# Patient Record
Sex: Female | Born: 1935
Health system: Southern US, Community
[De-identification: ages and names within clinical notes are randomized; demographics above are authoritative.]

## PROBLEM LIST (undated history)

## (undated) DIAGNOSIS — I351 Nonrheumatic aortic (valve) insufficiency: Secondary | ICD-10-CM

## (undated) DIAGNOSIS — I350 Nonrheumatic aortic (valve) stenosis: Secondary | ICD-10-CM

## (undated) DIAGNOSIS — I10 Essential (primary) hypertension: Secondary | ICD-10-CM

## (undated) DIAGNOSIS — I428 Other cardiomyopathies: Secondary | ICD-10-CM

## (undated) DIAGNOSIS — E785 Hyperlipidemia, unspecified: Secondary | ICD-10-CM

## (undated) DIAGNOSIS — E119 Type 2 diabetes mellitus without complications: Secondary | ICD-10-CM

## (undated) DIAGNOSIS — I2510 Atherosclerotic heart disease of native coronary artery without angina pectoris: Secondary | ICD-10-CM

## (undated) DIAGNOSIS — E039 Hypothyroidism, unspecified: Secondary | ICD-10-CM

## (undated) HISTORY — DX: Nonrheumatic aortic (valve) insufficiency: I35.1

## (undated) HISTORY — DX: Atherosclerotic heart disease of native coronary artery without angina pectoris: I25.10

## (undated) HISTORY — DX: Nonrheumatic aortic (valve) stenosis: I35.0

## (undated) HISTORY — DX: Essential (primary) hypertension: I10

## (undated) HISTORY — DX: Type 2 diabetes mellitus without complications: E11.9

## (undated) HISTORY — DX: Hyperlipidemia, unspecified: E78.5

## (undated) HISTORY — PX: SKIN GRAFT: SHX250

## (undated) HISTORY — DX: Other cardiomyopathies: I42.8

## (undated) HISTORY — DX: Hypothyroidism, unspecified: E03.9

---

## 2006-05-22 ENCOUNTER — Ambulatory Visit: Payer: Self-pay | Admitting: Cardiology

## 2006-06-06 ENCOUNTER — Ambulatory Visit: Payer: Self-pay

## 2006-06-06 ENCOUNTER — Encounter: Payer: Self-pay | Admitting: Cardiology

## 2006-06-18 ENCOUNTER — Ambulatory Visit: Payer: Self-pay | Admitting: Cardiology

## 2006-06-28 ENCOUNTER — Inpatient Hospital Stay (HOSPITAL_BASED_OUTPATIENT_CLINIC_OR_DEPARTMENT_OTHER): Admission: RE | Admit: 2006-06-28 | Discharge: 2006-06-28 | Payer: Self-pay | Admitting: Cardiology

## 2006-06-28 ENCOUNTER — Ambulatory Visit: Payer: Self-pay | Admitting: Internal Medicine

## 2006-07-05 ENCOUNTER — Ambulatory Visit: Payer: Self-pay | Admitting: Cardiology

## 2006-08-06 ENCOUNTER — Ambulatory Visit: Payer: Self-pay | Admitting: Cardiology

## 2006-10-15 ENCOUNTER — Ambulatory Visit: Payer: Self-pay | Admitting: Gastroenterology

## 2006-10-25 LAB — HM COLONOSCOPY: HM Colonoscopy: NORMAL

## 2006-10-29 ENCOUNTER — Ambulatory Visit: Payer: Self-pay | Admitting: Gastroenterology

## 2007-01-29 ENCOUNTER — Ambulatory Visit: Payer: Self-pay | Admitting: Cardiology

## 2007-02-11 ENCOUNTER — Encounter: Payer: Self-pay | Admitting: Cardiology

## 2007-02-11 ENCOUNTER — Ambulatory Visit: Payer: Self-pay

## 2007-09-19 ENCOUNTER — Ambulatory Visit: Payer: Self-pay | Admitting: Cardiology

## 2008-04-13 ENCOUNTER — Ambulatory Visit: Payer: Self-pay | Admitting: Vascular Surgery

## 2008-04-20 ENCOUNTER — Ambulatory Visit: Payer: Self-pay | Admitting: Cardiology

## 2008-04-27 ENCOUNTER — Encounter: Payer: Self-pay | Admitting: Cardiology

## 2008-04-27 ENCOUNTER — Ambulatory Visit: Payer: Self-pay | Admitting: Cardiology

## 2009-03-04 ENCOUNTER — Ambulatory Visit: Payer: Self-pay | Admitting: Cardiology

## 2009-03-15 ENCOUNTER — Ambulatory Visit: Payer: Self-pay | Admitting: Cardiology

## 2009-09-08 DIAGNOSIS — I359 Nonrheumatic aortic valve disorder, unspecified: Secondary | ICD-10-CM

## 2009-09-08 DIAGNOSIS — I10 Essential (primary) hypertension: Secondary | ICD-10-CM

## 2009-09-08 DIAGNOSIS — I429 Cardiomyopathy, unspecified: Secondary | ICD-10-CM | POA: Insufficient documentation

## 2010-03-02 LAB — HM DEXA SCAN

## 2010-10-29 ENCOUNTER — Encounter: Payer: Self-pay | Admitting: Cardiology

## 2010-10-29 ENCOUNTER — Encounter: Payer: Self-pay | Admitting: Gastroenterology

## 2010-11-02 ENCOUNTER — Encounter: Payer: Self-pay | Admitting: Gastroenterology

## 2010-11-14 ENCOUNTER — Encounter: Payer: Self-pay | Admitting: Gastroenterology

## 2010-11-15 ENCOUNTER — Ambulatory Visit: Payer: Self-pay | Admitting: Cardiology

## 2010-11-25 ENCOUNTER — Encounter: Payer: Self-pay | Admitting: Cardiology

## 2010-12-05 ENCOUNTER — Encounter (INDEPENDENT_AMBULATORY_CARE_PROVIDER_SITE_OTHER): Payer: Self-pay | Admitting: *Deleted

## 2010-12-29 ENCOUNTER — Ambulatory Visit
Admission: RE | Admit: 2010-12-29 | Discharge: 2010-12-29 | Payer: Self-pay | Source: Home / Self Care | Attending: Gastroenterology | Admitting: Gastroenterology

## 2010-12-29 ENCOUNTER — Other Ambulatory Visit: Payer: Self-pay | Admitting: Gastroenterology

## 2010-12-29 ENCOUNTER — Encounter: Payer: Self-pay | Admitting: Gastroenterology

## 2010-12-29 DIAGNOSIS — D509 Iron deficiency anemia, unspecified: Secondary | ICD-10-CM | POA: Insufficient documentation

## 2010-12-29 LAB — CBC WITH DIFFERENTIAL/PLATELET
Basophils Absolute: 0.1 10*3/uL (ref 0.0–0.1)
Basophils Relative: 1.4 % (ref 0.0–3.0)
Eosinophils Absolute: 0.2 10*3/uL (ref 0.0–0.7)
Eosinophils Relative: 2.8 % (ref 0.0–5.0)
HCT: 33 % — ABNORMAL LOW (ref 36.0–46.0)
Hemoglobin: 11.2 g/dL — ABNORMAL LOW (ref 12.0–15.0)
Lymphocytes Relative: 28.4 % (ref 12.0–46.0)
Lymphs Abs: 2.2 10*3/uL (ref 0.7–4.0)
MCHC: 33.9 g/dL (ref 30.0–36.0)
MCV: 90.8 fl (ref 78.0–100.0)
Monocytes Absolute: 0.4 10*3/uL (ref 0.1–1.0)
Monocytes Relative: 5.5 % (ref 3.0–12.0)
Neutro Abs: 4.8 10*3/uL (ref 1.4–7.7)
Neutrophils Relative %: 61.9 % (ref 43.0–77.0)
Platelets: 208 10*3/uL (ref 150.0–400.0)
RBC: 3.63 Mil/uL — ABNORMAL LOW (ref 3.87–5.11)
RDW: 18.3 % — ABNORMAL HIGH (ref 11.5–14.6)
WBC: 7.7 10*3/uL (ref 4.5–10.5)

## 2010-12-30 ENCOUNTER — Encounter: Payer: Self-pay | Admitting: Gastroenterology

## 2010-12-30 ENCOUNTER — Ambulatory Visit
Admission: RE | Admit: 2010-12-30 | Discharge: 2010-12-30 | Payer: Self-pay | Source: Home / Self Care | Attending: Gastroenterology | Admitting: Gastroenterology

## 2010-12-30 LAB — GLUCOSE, CAPILLARY
Glucose-Capillary: 186 mg/dL — ABNORMAL HIGH (ref 70–99)
Glucose-Capillary: 146 mg/dL — ABNORMAL HIGH (ref 70–99)

## 2011-01-02 ENCOUNTER — Encounter: Payer: Self-pay | Admitting: Gastroenterology

## 2011-01-11 ENCOUNTER — Ambulatory Visit
Admission: RE | Admit: 2011-01-11 | Discharge: 2011-01-11 | Payer: Self-pay | Source: Home / Self Care | Attending: Gastroenterology | Admitting: Gastroenterology

## 2011-01-11 ENCOUNTER — Encounter: Payer: Self-pay | Admitting: Gastroenterology

## 2011-01-11 DIAGNOSIS — D131 Benign neoplasm of stomach: Secondary | ICD-10-CM | POA: Insufficient documentation

## 2011-01-13 ENCOUNTER — Encounter: Payer: Self-pay | Admitting: Gastroenterology

## 2011-01-13 ENCOUNTER — Ambulatory Visit (HOSPITAL_COMMUNITY)
Admission: RE | Admit: 2011-01-13 | Discharge: 2011-01-13 | Payer: Self-pay | Source: Home / Self Care | Attending: Gastroenterology | Admitting: Gastroenterology

## 2011-01-16 LAB — GLUCOSE, CAPILLARY: Glucose-Capillary: 103 mg/dL — ABNORMAL HIGH (ref 70–99)
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
## 2011-01-24 NOTE — Letter (Signed)
Summary: New Patient letter  [facility]  [address] [place], [place] [address]   Phone: [phone]  Fax: [phone]       11/14/2010 MRN: [id number]  Emily Wall [address] [place], [place]  [address]  Dear [name],  Welcome to the Gastroenterology Division at [facility].    You are scheduled to see Dr.  Arlyce Dice on 12-29-2010 at  3:45pm on the 3rd floor at [facility], [address]  We ask that you try to arrive at our office 15 minutes prior to your appointment time to allow for check-in.  We would like you to complete the enclosed self-administered evaluation form prior to your visit and bring it with you on the day of your appointment.  We will review it with you.  Also, please bring a complete list of all your medications or, if you prefer, bring the medication bottles and we will list them.  Please bring your insurance card so that we may make a copy of it.  If your insurance requires a referral to see a specialist, please bring your referral form from your primary care physician.  Co-payments are due at the time of your visit and may be paid by cash, check or credit card.     Your office visit will consist of a consult with your physician (includes a physical exam), any laboratory testing he/she may order, scheduling of any necessary diagnostic testing (e.g. x-ray, ultrasound, CT-scan), and scheduling of a procedure (e.g. Endoscopy, Colonoscopy) if required.  Please allow enough time on your schedule to allow for any/all of these possibilities.    If you cannot keep your appointment, please call [phone] to cancel or reschedule prior to your appointment date.  This allows [place] the opportunity to schedule an appointment for another patient in need of care.  If you do not cancel or reschedule by 5 p.m. the business day prior to your appointment date, you will be charged a $50.00 late cancellation/no-show fee.    Thank you for choosing  [facility] for your medical needs.  We appreciate the opportunity to care for you.  Please visit [place] at our website  to learn more about our practice.                     Sincerely,                                                             The Gastroenterology Division

## 2011-01-26 NOTE — Letter (Signed)
Summary: Patient [facility] Biopsy Results  [facility]  [address] [place], [place] [address]   Phone: [phone]  Fax: [phone]        January 02, 2011 MRN: [id number]    Emily Wall [address] [place], [place]  [address]    Dear Ms. Sturtevant,  I am pleased to inform you that the biopsies taken during your recent endoscopic examination did not show any evidence of cancer upon pathologic examination.  Additional information/recommendations:  __No further action is needed at this time.  Please follow-up with      your primary care physician for your other healthcare needs.  _x_ Please call [phone] to schedule a return visit to review      your condition.  __ Continue with the treatment plan as outlined on the day of your      exam.  __ You should have a repeat endoscopic examination for this problem              in _ months/years.   Please call us if you are having persistent problems or have questions about your condition that have not been fully answered at this time.  Sincerely,  Louis Meckel MD  This letter has been electronically signed by your physician.  Appended Document: Patient Notice-Endo Biopsy Results Letter is mailed to the patient's home address

## 2011-01-26 NOTE — Letter (Signed)
Summary: Engineer, materials at [facility]  [address]   [place], [place] [address]   Phone: [phone]  Fax: [phone]        December 05, 2010 MRN: [id number]    DEBORHA MOSELEY [address] [place], [place]  [address]    Dear [name],  Your test ordered by Selena Batten has been reviewed by your physician (or physician assistant) and was found to be normal or stable. Your physician (or physician assistant) felt no changes were needed at this time.  __X__ Echocardiogram  ____ Cardiac Stress Test  ____ Lab Work  ____ Peripheral vascular study of arms, legs or neck  ____ CT scan or X-ray  ____ Lung or Breathing test  ____ Other:   Thank you.   Cyril Loosen, RN, BSN    Duane Boston, M.D., F.A.C.C. Thressa Sheller, M.D., F.A.C.C. Oneal Grout, M.D., F.A.C.C. Cheree Ditto, M.D., F.A.C.C. Daiva Nakayama, M.D., F.A.C.C. Kenney Houseman, M.D., F.A.C.C. Jeanne Ivan, PA-C

## 2011-01-26 NOTE — Procedures (Signed)
Summary: Upper Endoscopy  Patient: Emily Wall Note: All result statuses are Final unless otherwise noted.  Tests: (1) Upper Endoscopy (EGD)   EGD Upper Endoscopy       DONE     [facility]     [address]     [place], [place]  [address]           ENDOSCOPY PROCEDURE REPORT           PATIENT:  Emily Wall, Duty  MR#:  [id number]     BIRTHDATE:  Feb 20, 1936, 74 yrs. old  GENDER:  female           ENDOSCOPIST:  Barbette Hair. Arlyce Dice, MD     Referred by:  Rudi Heap, M.D.           PROCEDURE DATE:  12/30/2010     PROCEDURE:  EGD with biopsy, [address]     ASA CLASS:  Class II     INDICATIONS:  iron deficiency anemia, hemoccult positive stool           MEDICATIONS:   Fentanyl 25 mcg IV, Versed 2.5 mg IV,     glycopyrrolate (Robinal) 0.2 mg IV, 0.6cc simethancone 0.6 cc PO     TOPICAL ANESTHETIC:  Exactacain Spray           DESCRIPTION OF PROCEDURE:   After the risks benefits and     alternatives of the procedure were thoroughly explained, informed     consent was obtained.  The LB GIF-H180 [id number] endoscope was     introduced through the mouth and advanced to the third portion of     the duodenum, without limitations.  The instrument was slowly     withdrawn as the mucosa was fully examined.     <<PROCEDUREIMAGES>>           A sessile polyp was found in the antrum. It was 15 mm in size.     Multiple biopsies were obtained and sent to pathology (see     image1).  Otherwise the examination was normal.    Retroflexed     views revealed no abnormalities.    The scope was then withdrawn     from the patient and the procedure completed.           COMPLICATIONS:  None           ENDOSCOPIC IMPRESSION:     1) 15 mm sessile polyp in the antrum     2) Otherwise normal examination     RECOMMENDATIONS:     1) Await biopsy results           REPEAT EXAM:   You will receive a letter from Dr. Arlyce Dice in 1-2     weeks, after reviewing the final pathology, with followup  recommendations..           ______________________________     Barbette Hair Arlyce Dice, MD           CC:           n.     eSIGNED:   Barbette Hair. Kaplan at 12/30/2010 09:19 AM           Forrestine Him, [id number]  Note: An exclamation mark (!) indicates a result that was not dispersed into the flowsheet. Document Creation Date: 12/30/2010 9:19 AM _______________________________________________________________________  (1) Order result status: Final Collection or observation date-time: 12/30/2010 09:14 Requested date-time:  Receipt date-time:  Reported date-time:  Referring Physician:   Ordering Physician:  Melvia Heaps [phone]) Specimen Source:  Source: Launa Grill Order Number: [id number] Lab site:

## 2011-01-26 NOTE — Assessment & Plan Note (Signed)
Summary: ANEMIA/SCREEN FOR COLON/YF   History of Present Illness Visit Type: Initial Consult Primary GI MD: Melvia Heaps MD [facility] Primary Provider: [name], NP  Requesting Provider: Ernestina Penna, MD  Chief Complaint: Pt c/o anemia and dark stools but patient is on iron tablets  History of Present Illness:   Emily Wall is a pleasant 75 year old [place] American female referred at the request of Dr. Christell Constant and his associates for evaluation of anemia.  Fatigue prompted an ER visit in November, 2011 where a hemoglobin of 9 was noted.  MCV was 83.  She was told she was iron deficient.  Hemoglobin in 2007 was 12.9.  She was placed on iron supplements with resolution of her complaints.  She denies change in bowel habits, abdominal pain, melena or hematochezia.  She has no history of ulcer disease.  She is on no gastric irritants including nonsteroidals.  Colonoscopy in 2007 for screening was negative except for diverticulosis.  Family history is pertinent for her mother who had colon cancer in her 52s or early 95s.  The patient has diabetes, aortic stenosis and a nonischemic cardiomyopathy.   GI Review of Systems      Denies abdominal pain, acid reflux, belching, bloating, chest pain, dysphagia with liquids, dysphagia with solids, heartburn, loss of appetite, nausea, vomiting, vomiting blood, weight loss, and  weight gain.        Denies anal fissure, black tarry stools, change in bowel habit, constipation, diarrhea, diverticulosis, fecal incontinence, heme positive stool, hemorrhoids, irritable bowel syndrome, jaundice, light color stool, liver problems, rectal bleeding, and  rectal pain.    Current Medications (verified): 1)  Lisinopril 40 Mg Tabs (Lisinopril) .... Take One Tablet By Mouth Daily 2)  Coreg 25 Mg Tabs (Carvedilol) .... Take 1 Tablet By Mouth Two Times A Day. Contact Office For An Appt. 3)  Lasix 20 Mg Tabs (Furosemide) .... Take 1 Tablet By Mouth Once A Day 4)  Vitamin D 1000  Unit Tabs (Cholecalciferol) .... Take 1 Tablet By Mouth Twice A Day 5)  Caltrate 600+d Plus 600-400 Mg-Unit Tabs (Calcium Carbonate-Vit D-Min) .... Take 1 Tablet By Mouth Two Times A Day 6)  Fosamax 70 Mg Tabs (Alendronate Sodium) .... Take One By Mouth Weekly 7)  Metformin Hcl 500 Mg Tabs (Metformin Hcl) .... Take 1 Tablet By Mouth Two Times A Day 8)  Januvia 100 Mg Tabs (Sitagliptin Phosphate) .... Take 1 Tablet By Mouth Once A Day 9)  Aspirin 325 Mg Tabs (Aspirin) .... Take 1 Tablet By Mouth Once A Day 10)  Ferrous Sulfate 325 (65 Fe) Mg Tabs (Ferrous Sulfate) .... One Tablet By Mouth Two Times A Day  Allergies (verified): No Known Drug Allergies  Past History:  Past Medical History: Nonischemic cardiomyopathy - LVEF improved to 50-55% Diabetes Type 2 Aortic Stenosis - mild to moderate Aortic Insufficiency - mild CAD - mild, nonobstructive at catheterization 2007 Hypertension Diverticulosis Thyroid Disease  Anemia Arthritis  Past Surgical History: Reviewed history from 11/15/2010 and no changes required. Unremarkable  Family History: Family History of Colon Cancer: Mother   Social History: Retired Widowed 2 Childern Tobacco Use - Former.  Alcohol Use - no Illicit Drug Use - no Drug Use:  no  Review of Systems       The patient complains of anemia, back pain, cough, hearing problems, and itching.  The patient denies allergy/sinus, anxiety-new, arthritis/joint pain, blood in urine, breast changes/lumps, change in vision, confusion, coughing up blood, depression-new, fainting, fatigue, fever, headaches-new,  heart murmur, heart rhythm changes, menstrual pain, muscle pains/cramps, night sweats, nosebleeds, pregnancy symptoms, shortness of breath, skin rash, sleeping problems, sore throat, swelling of feet/legs, swollen lymph glands, thirst - excessive , urination - excessive , urination changes/pain, urine leakage, vision changes, and voice change.         All other systems  were reviewed and were negative   Vital Signs:  Patient profile:   75 year old female Height:      66 inches Weight:      181 pounds BMI:     29.32 BSA:     1.92 Pulse rate:   74 / minute Pulse rhythm:   regular BP sitting:   136 / 88  (left arm) Cuff size:   regular  Vitals Entered By: Ok Anis CMA (December 29, 2010 3:36 PM)  Physical Exam  Additional Exam:  On physical exam she is a well-developed well-nourished female  skin: anicter  HEENT: normocephalic; PEERLA; no nasal or orpharyngeal abnormalities neck: supple nodes: no cervical adenopathy chest: clear cor:  no  gallops or rubs; is a 2/6 early systolic murmur heard throughout the precordium with radiation to the second right intercostal space abd:  bowel sounds normoactive; no abdominal masses, tenderness, organomegaly rectal: no masses; stool trace heme-positive ext: no cyanosis, clubbing, or edema skeletal: no gross skeletal abnormalities neuro: alert, oriented x 3; no focal abnormalities    Impression & Recommendations:  Problem # 1:  IRON DEFICIENCY (ICD-280.9)  The patient has borderline microcytic indices with a clinical response to supplementary iron all consistent with iron deficiency anemia.  She has occult GI bleeding as evidenced by her Hemoccult cards.  Chronic GI bleeding sources should be ruled out including neoplasm, AVMs (h/o aortic stenosis), polyps and ulcers.  Recommendations #1 upper endoscopy #2 colonoscopy if endoscopy is not diagnostic for a GI bleeding source #3 CBC  Orders: TLB-CBC Platelet - w/Differential (85025-CBCD) EGD (EGD)  Problem # 2:  FM HX MALIGNANT NEOPLASM GASTROINTESTINAL TRACT (ICD-V16.0) Colonoscopy every 5 years  Problem # 3:  AORTIC STENOSIS (ICD-424.1) Assessment: Comment Only  Problem # 4:  CARDIOMYOPATHY, SECONDARY (ICD-425.9) Assessment: Comment Only  Patient Instructions: 1)  Copy sent to : [name], NP  Ernestina Penna, MD  2)  Your EGD is  scheduled for 12/30/2010 3)  The medication list was reviewed and reconciled.  All changed / newly prescribed medications were explained.  A complete medication list was provided to the patient / caregiver.

## 2011-01-26 NOTE — Assessment & Plan Note (Signed)
Summary: EGD f/u/[facility]   History of Present Illness Visit Type: Follow-up Visit Primary GI MD: Melvia Heaps MD [facility] Primary Provider: [name], NP  Requesting Provider: Ernestina Penna, MD  Chief Complaint: follow-up EGD History of Present Illness:   [name] has returned for followup of her iron deficiency anemia.  Endoscopy demonstrated a slightly friable 15 mm gastric polyp.  Biopsies showed hyperplastic changes only.  She has no GI complaints.   GI Review of Systems      Denies abdominal pain, acid reflux, belching, bloating, chest pain, dysphagia with liquids, dysphagia with solids, heartburn, loss of appetite, nausea, vomiting, vomiting blood, weight loss, and  weight gain.        Denies anal fissure, black tarry stools, change in bowel habit, constipation, diarrhea, diverticulosis, fecal incontinence, heme positive stool, hemorrhoids, irritable bowel syndrome, jaundice, light color stool, liver problems, rectal bleeding, and  rectal pain.    Current Medications (verified): 1)  Lisinopril 40 Mg Tabs (Lisinopril) .... Take One Tablet By Mouth Daily 2)  Coreg 25 Mg Tabs (Carvedilol) .... Take 1 Tablet By Mouth Two Times A Day. Contact Office For An Appt. 3)  Lasix 20 Mg Tabs (Furosemide) .... Take 1 Tablet By Mouth Once A Day 4)  Vitamin D 1000 Unit Tabs (Cholecalciferol) .... Take 1 Tablet By Mouth Twice A Day 5)  Caltrate 600+d Plus 600-400 Mg-Unit Tabs (Calcium Carbonate-Vit D-Min) .... Take 1 Tablet By Mouth Two Times A Day 6)  Fosamax 70 Mg Tabs (Alendronate Sodium) .... Take One By Mouth Weekly 7)  Metformin Hcl 500 Mg Tabs (Metformin Hcl) .... Take 1 Tablet By Mouth Two Times A Day 8)  Januvia 100 Mg Tabs (Sitagliptin Phosphate) .... Take 1 Tablet By Mouth Once A Day 9)  Aspirin 325 Mg Tabs (Aspirin) .... Take 1 Tablet By Mouth Once A Day 10)  Ferrous Sulfate 325 (65 Fe) Mg Tabs (Ferrous Sulfate) .... One Tablet By Mouth Two Times A Day  Allergies (verified): No Known  Drug Allergies  Past History:  Past Medical History: Reviewed history from 12/29/2010 and no changes required. Nonischemic cardiomyopathy - LVEF improved to 50-55% Diabetes Type 2 Aortic Stenosis - mild to moderate Aortic Insufficiency - mild CAD - mild, nonobstructive at catheterization 2007 Hypertension Diverticulosis Thyroid Disease  Anemia Arthritis  Past Surgical History: Reviewed history from 11/15/2010 and no changes required. Unremarkable  Family History: Reviewed history from 12/29/2010 and no changes required. Family History of Colon Cancer: Mother   Social History: Reviewed history from 12/29/2010 and no changes required. Retired Widowed 2 Childern Tobacco Use - Former.  Alcohol Use - no Illicit Drug Use - no  Review of Systems       The patient complains of allergy/sinus and cough.  The patient denies anemia, anxiety-new, arthritis/joint pain, back pain, blood in urine, breast changes/lumps, change in vision, confusion, coughing up blood, depression-new, fainting, fatigue, fever, headaches-new, hearing problems, heart murmur, heart rhythm changes, itching, menstrual pain, muscle pains/cramps, night sweats, nosebleeds, pregnancy symptoms, shortness of breath, skin rash, sleeping problems, sore throat, swelling of feet/legs, swollen lymph glands, thirst - excessive , urination - excessive , urination changes/pain, urine leakage, vision changes, and voice change.    Vital Signs:  Patient profile:   75 year old female Height:      66 inches Weight:      180 pounds BMI:     29.16 Pulse rate:   68 / minute Pulse rhythm:   regular BP sitting:  150 / 76  (left arm)  Vitals Entered By: Milford Cage NCMA (January 11, 2011 10:12 AM)   Impression & Recommendations:  Problem # 1:  IRON DEFICIENCY (ICD-280.9)  Her gastric polyp could be the source for chronic GI blood loss.  At the same time, I think we need to proceed with colonoscopy to rule out a lower GI  source as well.  Recommendations #1 EGD with band ligation of her gastric polyp #2 colonoscopy #3 followup Hemoccults after her polyp has been treated and if her colonoscopy is negative  Orders: Colonoscopy (Colon) ZEGD with Banding (ZEGD Band)  Problem # 2:  BENIGN NEOPLASM OF STOMACH (ICD-211.1) Since this could be a source of chronic GI blood loss he will be removed via band ligation.  Problem # 3:  CARDIOMYOPATHY, SECONDARY (ICD-425.9) Assessment: Comment Only  Patient Instructions: 1)  Copy sent to : [name], NP   Ernestina Penna, MD 2)   Your EGD is scheduled on 01/13/2011 at 12:30pm 3)  Conscious Sedation brochure given.  4)  Upper Endoscopy brochure given.  5)  Your Colonoscopy is scheduled on 02/02/2011 at 1:30pm 6)  We will send in your Moviprep to your pahrmacy today 7)  Colonoscopy and Flexible Sigmoidoscopy brochure given.  8)  The medication list was reviewed and reconciled.  All changed / newly prescribed medications were explained.  A complete medication list was provided to the patient / caregiver.

## 2011-01-26 NOTE — Procedures (Signed)
Summary: Preparation for Endoscopy / [facility]  Preparation for Endoscopy / [facility]   Imported By: Lennie Odor 01/16/2011 12:27:58  _____________________________________________________________________  External Attachment:    Type:   Image     Comment:   External Document

## 2011-01-26 NOTE — Letter (Signed)
Summary: EGD Instructions  [facility]  [address] [place], [place] [address]   Phone: [phone]  Fax: [phone]       Emily Wall    May 29, 1936    MRN: [id number]       Procedure Day /Date:FRIDAY 12/30/2010     Arrival Time: 8AM     Procedure Time:9AM     Location of Procedure:                    X  [facility] (4th Floor)   PREPARATION FOR ENDOSCOPY   On 12/30/2010  THE DAY OF THE PROCEDURE:  1.   No solid foods, milk or milk products are allowed after midnight the night before your procedure.  2.   Do not drink anything colored red or purple.  Avoid juices with pulp.  No orange juice.  3.  You may drink clear liquids until 7AM  which is 2 hours before your procedure.                                                                                                CLEAR LIQUIDS INCLUDE: Water Jello Ice Popsicles Tea (sugar ok, no milk/cream) Powdered fruit flavored drinks Coffee (sugar ok, no milk/cream) Gatorade Juice: apple, white grape, white cranberry  Lemonade Clear bullion, consomm, broth Carbonated beverages (any kind) Strained chicken noodle soup Hard Candy   MEDICATION INSTRUCTIONS  Unless otherwise instructed, you should take regular prescription medications with a small sip of water as early as possible the morning of your procedure.  Diabetic patients - see separate instructions.         OTHER INSTRUCTIONS  You will need a responsible adult at least 72 years of age to accompany you and drive you home.   This person must remain in the waiting room during your procedure.  Wear loose fitting clothing that is easily removed.  Leave jewelry and other valuables at home.  However, you may wish to bring a book to read or an iPod/MP3 player to listen to music as you wait for your procedure to start.  Remove all body piercing jewelry and leave at home.  Total time from sign-in until discharge is approximately 2-3  hours.  You should go home directly after your procedure and rest.  You can resume normal activities the day after your procedure.  The day of your procedure you should not:   Drive   Make legal decisions   Operate machinery   Drink alcohol   Return to work  You will receive specific instructions about eating, activities and medications before you leave.    The above instructions have been reviewed and explained to me by   _______________________    I fully understand and can verbalize these instructions _____________________________ Date _________

## 2011-01-26 NOTE — Letter (Signed)
Summary: [facility] Instructions  [facility]  [address] [place], [place] [address]   Phone: [phone]  Fax: [phone]       Emily Wall    1936-01-06    MRN: [id number]        Procedure Day /Date:THURSDAY 02/02/2011     Arrival Time:12:30PM     Procedure Time:1:30PM     Location of Procedure:                    X   [facility] (4th Floor)   PREPARATION FOR COLONOSCOPY WITH MOVIPREP   Starting 5 days prior to your procedure 01/28/2011  do not eat nuts, seeds, popcorn, corn, beans, peas,  salads, or any raw vegetables.  Do not take any fiber supplements (e.g. Metamucil, Citrucel, and Benefiber).  THE DAY BEFORE YOUR PROCEDURE         DATE: 02/01/2011  DAY:WED  1.  Drink clear liquids the entire day-NO SOLID FOOD  2.  Do not drink anything colored red or purple.  Avoid juices with pulp.  No orange juice.  3.  Drink at least 64 oz. (8 glasses) of fluid/clear liquids during the day to prevent dehydration and help the prep work efficiently.  CLEAR LIQUIDS INCLUDE: Water Jello Ice Popsicles Tea (sugar ok, no milk/cream) Powdered fruit flavored drinks Coffee (sugar ok, no milk/cream) Gatorade Juice: apple, white grape, white cranberry  Lemonade Clear bullion, consomm, broth Carbonated beverages (any kind) Strained chicken noodle soup Hard Candy                             4.  In the morning, mix first dose of MoviPrep solution:    Empty 1 Pouch A and 1 Pouch B into the disposable container    Add lukewarm drinking water to the top line of the container. Mix to dissolve    Refrigerate (mixed solution should be used within 24 hrs)  5.  Begin drinking the prep at 5:00 p.m. The MoviPrep container is divided by 4 marks.   Every 15 minutes drink the solution down to the next mark (approximately 8 oz) until the full liter is complete.   6.  Follow completed prep with 16 oz of clear liquid of your choice (Nothing red or purple).  Continue  to drink clear liquids until bedtime.  7.  Before going to bed, mix second dose of MoviPrep solution:    Empty 1 Pouch A and 1 Pouch B into the disposable container    Add lukewarm drinking water to the top line of the container. Mix to dissolve    Refrigerate  THE DAY OF YOUR PROCEDURE      DATE: 02/02/2011 DAY: THURSDAY  Beginning at 8:30a.m. (5 hours before procedure):         1. Every 15 minutes, drink the solution down to the next mark (approx 8 oz) until the full liter is complete.  2. Follow completed prep with 16 oz. of clear liquid of your choice.    3. You may drink clear liquids until 11:30AM  (2 HOURS BEFORE PROCEDURE).   MEDICATION INSTRUCTIONS  Unless otherwise instructed, you should take regular prescription medications with a small sip of water   as early as possible the morning of your procedure.  Diabetic patients - see separate instructions. HOLD IRON 7 DAYS BEFORE PROCEDURE       OTHER INSTRUCTIONS  You will need  a responsible adult at least 73 years of age to accompany you and drive you home.   This person must remain in the waiting room during your procedure.  Wear loose fitting clothing that is easily removed.  Leave jewelry and other valuables at home.  However, you may wish to bring a book to read or  an iPod/MP3 player to listen to music as you wait for your procedure to start.  Remove all body piercing jewelry and leave at home.  Total time from sign-in until discharge is approximately 2-3 hours.  You should go home directly after your procedure and rest.  You can resume normal activities the  day after your procedure.  The day of your procedure you should not:   Drive   Make legal decisions   Operate machinery   Drink alcohol   Return to work  You will receive specific instructions about eating, activities and medications before you leave.    The above instructions have been reviewed and explained to me by    _______________________    I fully understand and can verbalize these instructions _____________________________ Date _________

## 2011-01-26 NOTE — Letter (Signed)
Summary: Diabetic Instructions  [facility]  [address] [place], [place] [address]   Phone: [phone]  Fax: [phone]    Emily Wall 1936-05-26 MRN: [id number]     X    ORAL DIABETIC MEDICATION INSTRUCTIONS  The day before your procedure:   Take your diabetic pill as you do normally  The day of your procedure:   Do not take your diabetic pill    We will check your blood sugar levels during the admission process and again in Recovery before discharging you home  ________________________________________________________________________  _  _   INSULIN (LONG ACTING) MEDICATION INSTRUCTIONS (Lantus, NPH, 70/30, Humulin, Novolin-N)   The day before your procedure:   Take  your regular evening dose    The day of your procedure:   Do not take your morning dose    _  _   INSULIN (SHORT ACTING) MEDICATION INSTRUCTIONS (Regular, Humulog, Novolog)   The day before your procedure:   Do not take your evening dose   The day of your procedure:   Do not take your morning dose   _  _   INSULIN PUMP MEDICATION INSTRUCTIONS  We will contact the physician managing your diabetic care for written dosage instructions for the day before your procedure and the day of your procedure.  Once we have received the instructions, we will contact you.

## 2011-01-26 NOTE — Letter (Signed)
Summary: [facility]  [facility]   Imported By: Sherian Rein 01/06/2011 07:48:03  _____________________________________________________________________  External Attachment:    Type:   Image     Comment:   External Document

## 2011-01-26 NOTE — Letter (Signed)
Summary: Appt Reminder 2  [facility]  [address] [place], [place] [address]   Phone: [phone]  Fax: [phone]        January 02, 2011 MRN: [id number]    Emily Wall [address] [place], [place]  [address]    Dear [name],   You have a return appointment with Dr. Arlyce Dice on 01/11/11 at 10:15am.  Please remember to bring a complete list of the medicines you are taking, your insurance card and your co-pay.  If you have to cancel or reschedule this appointment, please call before 5:00 pm the evening before to avoid a cancellation fee.  If you have any questions or concerns, please call [phone].    Sincerely,    Selinda Michaels RN

## 2011-01-26 NOTE — Procedures (Signed)
Summary: colonoscopy   Colonoscopy  Procedure date:  10/29/2006  Findings:      Results: Diverticulosis.       Location:  [facility].    Comments:      Repeat colonoscopy in 10 years.    Colonoscopy  Procedure date:  10/29/2006  Findings:      Results: Diverticulosis.       Location:  [facility].    Comments:      Repeat colonoscopy in 10 years.   Patient Name: Emily Wall, Emily Wall MRN:  Procedure Procedures: Colonoscopy CPT: [address].  Personnel: Endoscopist: Barbette Hair. Arlyce Dice, MD.  Patient Consent: Procedure, Alternatives, Risks and Benefits discussed, consent obtained, from patient.  Indications  Average Risk Screening Routine.  History  Current Medications: Patient is not currently taking Coumadin.  Pre-Exam Physical: Performed Oct 29, 2006. Cardio-pulmonary exam, HEENT exam , Abdominal exam, Mental status exam WNL.  Comments: Patient history reviewed/updated, physical performed prior to initiation of sedation?yes Exam Exam: Extent of exam reached: Cecum, extent intended: Cecum.  The cecum was identified by appendiceal orifice and IC valve. Time to Cecum: 00:04: 44. Time for Withdrawl: 00:06:59. Colon retroflexion performed. ASA Classification: I. Tolerance: good.  Monitoring: Pulse and BP monitoring, Oximetry used. Supplemental O2 given. at 2 Liters.  Colon Prep Used Miralax for colon prep. Prep results: fair, adequate exam.  Sedation Meds: Patient assessed and found to be appropriate for moderate (conscious) sedation. Sedation was managed by the Endoscopist. Fentanyl 50 mcg. given IV. Versed 7 mg. given IV.  Findings - DIVERTICULOSIS: Ascending Colon to Descending Colon. ICD9: Diverticulosis: 562.10. Comments: Diffuse diverticulosis.  NORMAL EXAM: Cecum.  - DIVERTICULOSIS: Sigmoid Colon. Comments: Diffuse diverticula.  - NORMAL EXAM: Sigmoid Colon to Rectum.   Assessment Abnormal examination, see findings above.    Diagnoses: 562.10: Diverticulosis.   Events  Unplanned Interventions: No intervention was required.  Unplanned Events: There were no complications. Plans  Post Exam Instructions: Post sedation instructions given.  Patient Education: Patient given standard instructions for: Diverticulosis.  Disposition: After procedure patient sent to recovery. After recovery patient sent home.  Scheduling/Referral: Colonoscopy, to Barbette Hair. Arlyce Dice, MD, around Oct 29, 2016.    This report was created from the original endoscopy report, which was reviewed and signed by the above listed endoscopist.

## 2011-01-26 NOTE — Letter (Signed)
Summary: EGD Instructions  [facility]  [address] [place], [place] [address]   Phone: [phone]  Fax: [phone]       Emily Wall    19-Feb-1936    MRN: [id number]       Procedure Day /Date:FRIDAY 01/13/2011     Arrival Time: 11:30AM      Procedure Time:12:30PM     Location of Procedure:                     X _ [facility] ( Outpatient Registration)  PREPARATION FOR ENDOSCOPY/BANDING   On 01/13/2011  THE DAY OF THE PROCEDURE:  1.   No solid foods, milk or milk products are allowed after midnight the night before your procedure.  2.   Do not drink anything colored red or purple.  Avoid juices with pulp.  No orange juice.  3.  You may drink clear liquids until 10:30AM , which is 2 hours before your procedure.                                                                                                CLEAR LIQUIDS INCLUDE: Water Jello Ice Popsicles Tea (sugar ok, no milk/cream) Powdered fruit flavored drinks Coffee (sugar ok, no milk/cream) Gatorade Juice: apple, white grape, white cranberry  Lemonade Clear bullion, consomm, broth Carbonated beverages (any kind) Strained chicken noodle soup Hard Candy   MEDICATION INSTRUCTIONS  Unless otherwise instructed, you should take regular prescription medications with a small sip of water as early as possible the morning of your procedure.  Diabetic patients - see separate instructions.              OTHER INSTRUCTIONS  You will need a responsible adult at least 73 years of age to accompany you and drive you home.   This person must remain in the waiting room during your procedure.  Wear loose fitting clothing that is easily removed.  Leave jewelry and other valuables at home.  However, you may wish to bring a book to read or an iPod/MP3 player to listen to music as you wait for your procedure to start.  Remove all body piercing jewelry and leave at home.  Total time from  sign-in until discharge is approximately 2-3 hours.  You should go home directly after your procedure and rest.  You can resume normal activities the day after your procedure.  The day of your procedure you should not:   Drive   Make legal decisions   Operate machinery   Drink alcohol   Return to work  You will receive specific instructions about eating, activities and medications before you leave.    The above instructions have been reviewed and explained to me by   _______________________    I fully understand and can verbalize these instructions _____________________________ Date _________

## 2011-01-26 NOTE — Letter (Signed)
Summary: Diabetic Instructions  [facility]  [address] [place], [place] [address]   Phone: [phone]  Fax: [phone]    ADELE MILSON 1935-12-31 MRN: [id number]   X    ORAL DIABETIC MEDICATION INSTRUCTIONS  The day before your procedure:   Take your diabetic pill as you do normally  The day of your procedure:   Do not take your diabetic pill    We will check your blood sugar levels during the admission process and again in Recovery before discharging you home  ________________________________________________________________________  _  _   INSULIN (LONG ACTING) MEDICATION INSTRUCTIONS (Lantus, NPH, 70/30, Humulin, Novolin-N)   The day before your procedure:   Take  your regular evening dose    The day of your procedure:   Do not take your morning dose    _  _   INSULIN (SHORT ACTING) MEDICATION INSTRUCTIONS (Regular, Humulog, Novolog)   The day before your procedure:   Do not take your evening dose   The day of your procedure:   Do not take your morning dose   _  _   INSULIN PUMP MEDICATION INSTRUCTIONS  We will contact the physician managing your diabetic care for written dosage instructions for the day before your procedure and the day of your procedure.  Once we have received the instructions, we will contact you.

## 2011-01-26 NOTE — Procedures (Signed)
Summary: Upper Endoscopy  Patient: Emily Wall Note: All result statuses are Final unless otherwise noted.  Tests: (1) Upper Endoscopy (EGD)   EGD Upper Endoscopy       DONE     [facility]     [address] [place], [place]  [address]           ENDOSCOPY PROCEDURE REPORT           PATIENT:  Emily Wall, Penafiel  MR#:  [id number]     BIRTHDATE:  1936/08/24, 74 yrs. old  GENDER:  female           ENDOSCOPIST:  Barbette Hair. Arlyce Dice, MD     Referred by:  Rosalyn Gess. Norins, M.D.           PROCEDURE DATE:  01/13/2011     PROCEDURE:  EGD with band ligation  (removal) of Gastric Polyp     ASA CLASS:  Class II     INDICATIONS:  iron deficiency anemia, other           MEDICATIONS:   Fentanyl 60 mcg IV, Versed 5 mg IV, glycopyrrolate     (Robinal) 0.2 mg IV     TOPICAL ANESTHETIC:  Cetacaine Spray           DESCRIPTION OF PROCEDURE:   After the risks benefits and     alternatives of the procedure were thoroughly explained, informed     consent was obtained.  The  endoscope was introduced through the     mouth and advanced to the bulb of duodenum, without limitations.     The instrument was slowly withdrawn as the mucosa was fully     examined.     <<PROCEDUREIMAGES>>           A sessile polyp was found in the antrum (see image1 and image2).     15mm sessile polyp - 2 bands were placed at base of polyp     Otherwise the examination was normal.    Retroflexed views revealed     no abnormalities.    The scope was then withdrawn from the patient     and the procedure completed.           COMPLICATIONS:  None           ENDOSCOPIC IMPRESSION:     1) Sessile polyp in the antrum - s/p band ligation     2) Otherwise normal examination     RECOMMENDATIONS:     1) FOLLOW UP EGD in 3 months           REPEAT EXAM:   3 month(s) EGD.           ______________________________     Barbette Hair. Arlyce Dice, MD           CC:           n.     eSIGNED:   Barbette Hair. Conchita Truxillo at 01/13/2011  12:52 PM           Forrestine Him, [id number]  Note: An exclamation mark (!) indicates a result that was not dispersed into the flowsheet. Document Creation Date: 01/13/2011 12:52 PM _______________________________________________________________________  (1) Order result status: Final Collection or observation date-time: 01/13/2011 12:47 Requested date-time:  Receipt date-time:  Reported date-time:  Referring Physician:   Ordering Physician: Melvia Heaps [phone]) Specimen Source:  Source: Launa Grill Order Number: [id number] Lab site:

## 2011-01-31 ENCOUNTER — Telehealth: Payer: Self-pay | Admitting: Gastroenterology

## 2011-02-02 ENCOUNTER — Other Ambulatory Visit (AMBULATORY_SURGERY_CENTER): Payer: MEDICARE | Admitting: Gastroenterology

## 2011-02-02 ENCOUNTER — Other Ambulatory Visit: Payer: Self-pay | Admitting: Gastroenterology

## 2011-02-02 DIAGNOSIS — D509 Iron deficiency anemia, unspecified: Secondary | ICD-10-CM

## 2011-02-02 DIAGNOSIS — K5730 Diverticulosis of large intestine without perforation or abscess without bleeding: Secondary | ICD-10-CM

## 2011-02-03 ENCOUNTER — Encounter: Payer: Self-pay | Admitting: Gastroenterology

## 2011-02-09 NOTE — Letter (Signed)
Summary: CMA Hemoccult Letter  [facility]  [address] [place], [place] [address]   Phone: [phone]  Fax: [phone]         February 03, 2011 MRN: [id number]    Emily Wall [address] [place], [place]  [address]    Dear [name],     At your Endoscopy visit, Dr. Arlyce Dice requested that you complete the hemoccult cards given to you at your last visit. Please follow the instructions on the inside cover and return them as soon as possible.If you have misplaced the hemoccult cards, please call me at [phone] and I will mail you new cards. Your health is very important to [place].These tests will help ensure that Dr. Arlyce Dice has all the information at his disposal to make a complete diagnosis for you.  Thank you for your prompt attention to this matter.   Sincerely,    Selinda Michaels RN

## 2011-02-09 NOTE — Procedures (Signed)
Summary: Colon  COLONOSCOPY PROCEDURE REPORT  PATIENT:  Emily Wall, Emily Wall  MR#:  [id number] BIRTHDATE:   12/25/1936, 74 yrs. old   GENDER:   female  ENDOSCOPIST:   Barbette Hair. Arlyce Dice, MD Referred by: Rudi Heap, M.D.  PROCEDURE DATE:  02/02/2011 PROCEDURE:  Diagnostic Colonoscopy ASA CLASS:   Class II INDICATIONS: 1) Iron deficiency anemia   MEDICATIONS:    Fentanyl 50 mcg IV, Versed 6 mg IV  DESCRIPTION OF PROCEDURE:   After the risks benefits and alternatives of the procedure were thoroughly explained, informed consent was obtained.  Digital rectal exam was performed and revealed no abnormalities.   The LB 180AL [id number] endoscope was introduced through the anus and advanced to the cecum, which was identified by the ileocecal valve, without limitations.  The quality of the prep was good, using MoviPrep.  The instrument was then slowly withdrawn as the colon was fully examined. <<PROCEDUREIMAGES>>            <<OLD IMAGES>>  FINDINGS:  Moderate diverticulosis was found (see image2 and image4). Ascending colon and cecum  This was otherwise a normal examination of the colon (see image5, image6, image7, image8, and image9).   Retroflexed views in the rectum revealed no abnormalities.    The time to cecum =  5.75  minutes. The scope was then withdrawn (time =  6.0  min) from the patient and the procedure completed.  COMPLICATIONS:   None  ENDOSCOPIC IMPRESSION:  1) Moderate diverticulosis  2) Otherwise normal examination RECOMMENDATIONS:  1) followup hemeoccults 4-5 days  REPEAT EXAM:   No   _______________________________ Barbette Hair. Arlyce Dice, MD    CC:

## 2011-02-09 NOTE — Progress Notes (Signed)
Summary: Medication  Phone Note Call from Patient Call back at Home Phone [phone]   Caller: Patient Call For: Dr. Arlyce Dice Reason for Call: Talk to Nurse Summary of Call:  NEEDS PREP CALLED IN TO [facility] IN [place] Initial call taken by: [place] Johnson,  January 31, 2011 12:07 PM  Follow-up for Phone Call        Called pt to inform that MoviPrep was sent, No Answer Follow-up by: Merri Ray CMA (AAMA),  January 31, 2011 1:20 PM    New/Updated Medications: MOVIPREP 100 GM  SOLR (PEG-KCL-NACL-NASULF-NA ASC-C) As per prep instructions. Prescriptions: MOVIPREP 100 GM  SOLR (PEG-KCL-NACL-NASULF-NA ASC-C) As per prep instructions.  #1 x 0   Entered by:   Merri Ray CMA (AAMA)   Authorized by:   Louis Meckel MD   Signed by:   Merri Ray CMA (AAMA) on 01/31/2011   Method used:   Electronically to        [facility] [phone]* (retail)       [address] [place], [place]  [address]       Ph: [phone] or [phone]       Fax: [phone]   RxID:   [id number]

## 2011-02-16 ENCOUNTER — Other Ambulatory Visit: Payer: Self-pay | Admitting: Gastroenterology

## 2011-02-16 ENCOUNTER — Other Ambulatory Visit: Payer: MEDICARE

## 2011-02-16 ENCOUNTER — Encounter: Payer: Self-pay | Admitting: Gastroenterology

## 2011-02-16 ENCOUNTER — Encounter (INDEPENDENT_AMBULATORY_CARE_PROVIDER_SITE_OTHER): Payer: Self-pay | Admitting: *Deleted

## 2011-02-16 DIAGNOSIS — Z1289 Encounter for screening for malignant neoplasm of other sites: Secondary | ICD-10-CM

## 2011-02-16 LAB — HEMOCCULT SLIDES (X 3 CARDS)
OCCULT 1: NEGATIVE
OCCULT 2: NEGATIVE
OCCULT 3: NEGATIVE
OCCULT 4: NEGATIVE
OCCULT 5: NEGATIVE

## 2011-02-21 NOTE — Letter (Addendum)
Summary: Results Letter  [facility]  [address] [place], [place] [address]   Phone: [phone]  Fax: [phone]        February 16, 2011 MRN: [id number]    Emily Wall [address] [place], [place]  [address]    Dear Ms. Brightwell,  Your hemeoccult cards testing for microscopic bleeding were negative.  No further GI workup is required at this time.   Please continue with the recommendations that we previously discussed. Should you have any further questions or concerns, feel free to contact me.    Sincerely,  Barbette Hair. Arlyce Dice, M.D., [facility]          Sincerely,  Louis Meckel MD  This letter has been electronically signed by your physician.  Appended Document: Results Letter letter mailed

## 2011-02-23 ENCOUNTER — Telehealth (INDEPENDENT_AMBULATORY_CARE_PROVIDER_SITE_OTHER): Payer: Self-pay

## 2011-02-23 ENCOUNTER — Encounter: Payer: Self-pay | Admitting: Gastroenterology

## 2011-03-02 NOTE — Letter (Signed)
Summary: Diabetic Instructions  [facility]  [address]   [place], [place] [address]   Phone: [phone]  Fax: [phone]    Michaelina Hershman 05/21/1936 MRN: [id number]   X    ORAL DIABETIC MEDICATION INSTRUCTIONS  The day before your procedure:   Take your diabetic pill as you do normally  The day of your procedure:   Do not take your diabetic pill    We will check your blood sugar levels during the admission process and again in Recovery before discharging you home  ________________________________________________________________________  _  _   INSULIN (LONG ACTING) MEDICATION INSTRUCTIONS (Lantus, NPH, 70/30, Humulin, Novolin-N)   The day before your procedure:   Take  your regular evening dose    The day of your procedure:   Do not take your morning dose    _  _   INSULIN (SHORT ACTING) MEDICATION INSTRUCTIONS (Regular, Humulog, Novolog)   The day before your procedure:   Do not take your evening dose   The day of your procedure:   Do not take your morning dose   _  _   INSULIN PUMP MEDICATION INSTRUCTIONS  We will contact the physician managing your diabetic care for written dosage instructions for the day before your procedure and the day of your procedure.  Once we have received the instructions, we will contact you. 

## 2011-03-02 NOTE — Progress Notes (Signed)
Summary: Schedule endo  Phone Note Outgoing Call Call back at [facility] Phone [phone]   Call placed by: Selinda Michaels RN,  February 23, 2011 11:17 AM Call placed to: Patient Summary of Call: Patient scheduled for a repeat EGD for April 20th at 8am. Patient to arrive at the [facility] at 7:30am. Instuctions mailed to patient. Patient verbalized understanding. Initial call taken by: Selinda Michaels RN,  February 23, 2011 11:18 AM

## 2011-03-02 NOTE — Letter (Signed)
Summary: EGD Instructions  [facility]  [address] [place], [place] [address]   Phone: [phone]  Fax: [phone]       Emily Wall    October 01, 1936    MRN: [id number]       Procedure Day /Date:04/14/11     Arrival Time: 7:30am     Procedure Time:8:00am     Location of Procedure:                    _ X _ [facility] (4th Floor)  PREPARATION FOR ENDOSCOPY   On 04/14/11 THE DAY OF THE PROCEDURE:  1.   No solid foods, milk or milk products are allowed after midnight the night before your procedure.  2.   Do not drink anything colored red or purple.  Avoid juices with pulp.  No orange juice.  3.  You may drink clear liquids until 6:00am, which is 2 hours before your procedure.                                                                                                CLEAR LIQUIDS INCLUDE: Water Jello Ice Popsicles Tea (sugar ok, no milk/cream) Powdered fruit flavored drinks Coffee (sugar ok, no milk/cream) Gatorade Juice: apple, white grape, white cranberry  Lemonade Clear bullion, consomm, broth Carbonated beverages (any kind) Strained chicken noodle soup Hard Candy   MEDICATION INSTRUCTIONS  Unless otherwise instructed, you should take regular prescription medications with a small sip of water as early as possible the morning of your procedure.  Diabetic patients - see separate instructions.             OTHER INSTRUCTIONS  You will need a responsible adult at least 73 years of age to accompany you and drive you home.   This person must remain in the waiting room during your procedure.  Wear loose fitting clothing that is easily removed.  Leave jewelry and other valuables at home.  However, you may wish to bring a book to read or an iPod/MP3 player to listen to music as you wait for your procedure to start.  Remove all body piercing jewelry and leave at home.  Total time from sign-in until discharge is approximately  2-3 hours.  You should go home directly after your procedure and rest.  You can resume normal activities the day after your procedure.  The day of your procedure you should not:   Drive   Make legal decisions   Operate machinery   Drink alcohol   Return to work  You will receive specific instructions about eating, activities and medications before you leave.    The above instructions have been reviewed and explained to me by   _______________________    I fully understand and can verbalize these instructions _____________________________ Date _________

## 2011-03-29 ENCOUNTER — Encounter: Payer: Self-pay | Admitting: Nurse Practitioner

## 2011-03-29 DIAGNOSIS — M810 Age-related osteoporosis without current pathological fracture: Secondary | ICD-10-CM | POA: Insufficient documentation

## 2011-03-29 DIAGNOSIS — I509 Heart failure, unspecified: Secondary | ICD-10-CM

## 2011-03-29 DIAGNOSIS — E119 Type 2 diabetes mellitus without complications: Secondary | ICD-10-CM | POA: Insufficient documentation

## 2011-04-12 ENCOUNTER — Telehealth: Payer: Self-pay | Admitting: Gastroenterology

## 2011-04-12 NOTE — Telephone Encounter (Signed)
no

## 2011-04-14 ENCOUNTER — Other Ambulatory Visit: Payer: MEDICARE | Admitting: Gastroenterology

## 2011-05-09 NOTE — Assessment & Plan Note (Signed)
[facility]                            CARDIOLOGY OFFICE NOTE   [name], PINK                      MRN:          [id number]  DATE:04/20/2008                            DOB:          April 06, 1936    PRIMARY CARE:  [name], NP [facility].   REASON FOR PRESENTATION:  Cardiac followup.   HISTORY OF PRESENT ILLNESS:  Emily Wall comes back in for routine  visit.  She is not reporting any significant chest pain, palpitations or  progressive shortness of breath.  She has NYHA class II dyspnea on  exertion.  I reviewed the medications.  It seems that she is still  taking her Benicar, which we had discussed discontinuing in the past in  favor of lisinopril for cost savings.  Her electrocardiogram shows sinus  rhythm with decreased interseptal forces, an old finding.  Her last  echocardiogram in February 2008 demonstrated a left  ventricular  ejection fraction of 30-40% with mild left ventricle hypertrophy and  mild to moderate aortic stenosis.   ALLERGIES:  NO KNOWN DRUG ALLERGIES.   MEDICATIONS:  1. Januvia 100 mg p.o. daily.  2. Carvedilol 25 mg p.o. b.i.d.  3. Alendronate 70 mg weekly.  4. Lisinopril 40 mg p.o. daily.  5. Actos 15 mg p.c.  6. Metformin 1000 mg p.o. b.i.d.  7. Calcium supplements with vitamin D 600 mg p.o. daily.  8. Benicar 40 mg p.o. daily.  9. Lasix 20 mg p.o. daily.  10.Enteric coated aspirin 325 mg p.o. daily.   REVIEW OF SYSTEMS:  As in history of present illness, otherwise,  negative.   PHYSICAL EXAMINATION:  VITAL SIGNS:  Blood pressure 140/80, heart rate  is 83, weight 167 pounds, up 3 pounds from her last visit.  The patient  is comfortable and in no acute distress.  HEENT:  Conjunctivae and lids normal.  Pharynx clear.  NECK:  Supple.  No elevated was pressure, no loud bruits.  No  thyromegaly.  LUNGS:  Clear without labored breathing.  CARDIAC:  Reveals a regular rate and rhythm,  3/6 systolic murmur at the  base.  Second heart sounds preserved.  No S3 gallop.  ABDOMEN:  Soft, nontender.  EXTREMITIES:  Exhibit trace edema. The right lower extremity is  addressed following prior injury with subsequent follow-up through the  [facility].  She has previously documented traumatic ulceration  of the left pretibial area.   IMPRESSION AND RECOMMENDATIONS:  1. Nonischemic cardiomyopathy with an ejection fraction of 30-40% and      mild left ventricular hypertrophy.  She has previously documented      mild coronary atherosclerosis and is stable symptomatically.  We      will plan to continue medical therapy.  I have asked her to      discontinue Benicar and continue her lisinopril.  She may need      further up titration depending on her blood pressure.  Norvasc      would also be a consideration.  2. Mild to moderate aortic valve stenosis.  We will plan a follow-up  2-      D echocardiogram to reassess this.  3. History of hypertension and diabetes mellitus.  This is followed by      Ms. Daphine Deutscher.     Jonelle Sidle, MD  Electronically Signed    SGM/MedQ  DD: 04/20/2008  DT: 04/20/2008  Job #: [id number]   cc:   [place] Daphine Deutscher, MD  [facility] [name], NP

## 2011-05-09 NOTE — Consult Note (Signed)
VASCULAR SURGERY CONSULTATION   Emily Wall, Emily Wall  DOB:  1936/09/19                                       04/13/2008  CHART#:[id number]   The patient is a of 75 year old female who traumatized her left leg  about 2 months ago and created a traumatic ulceration to the left  pretibial region.  This has slowly improved with wound care from the  Wound Center in the direction of Dr. Burman Freestone but has not completely healed.  She does have adult onset diabetes mellitus and was referred today for  evaluation of her circulation.  She is receiving local wound care with  significant improvement she states.  She has no history of deep venous  thrombosis, thrombophlebitis, ischemic ulcers and denies any  claudication symptoms at one block.  She has no rest pain or history of  nonhealing ulcers.   PAST MEDICAL HISTORY:  Type 2 diabetes mellitus, hypertension.  Coronary  artery disease with MI in 2006, negative for stroke or hyperlipidemia.   PAST SURGICAL HISTORY:  None.   FAMILY HISTORY:  Positive coronary artery disease in sister, diabetes in  a brother.  Negative for stroke.   SOCIAL HISTORY:  She is widowed, has not used tobacco for 20 to 30  years.  Does not use alcohol.   REVIEW OF SYSTEMS:  Denies any significant cardiac, pulmonary, GI or GU  symptoms.  Also no hemiparesis, aphasia, amaurosis fugax, blurred vision, syncope  or other TIA type symptoms.  No orthopedic problems.   ALLERGIES:  None known.   PHYSICAL EXAM:  Blood pressure 130/73, heart rate 70, respirations 16.  General:  She is a female patient who is in no apparent distress, alert,  oriented x3.  Neck is supple 3+ carotid pulses palpable.  No bruits are  audible.  Neurologic:  Normal.  No palpable adenopathy in the neck.  Chest:  Clear to auscultation.  Cardiovascular:  Regular rhythm with no  murmurs.  No skin rashes noted.  Upper extremity pulses 3+ bilaterally.  Abdomen:  Soft, nontender with no  palpable masses.  She has 3+ femoral,  popliteal 2+ dorsalis pedis and posterior tibial pulses palpable  bilaterally.  No distal edema noted.  Left leg has 2 pretibial ulcers  distally.  One measures about 1x1.5 cm and is clean and noninfected  right over the tibia.  There is one slightly proximal to this which is  almost healed the skin in this area is slightly dark.  No other active  ulcerations are noted.  She does not have any varicosities or evidence  of venous insufficiency.   Lower extremity arterial Dopplers revealed normal arterial flow in both  lower extremities with triphasic flow with ABIs of 1.0.   I reassured her that she does not have significant arterial  insufficiency and this ulcer does appear to be traumatic in origin and  should eventually heal, although it is a difficult area.  She should  return to Dr. Burman Freestone in the Wound Center for continued care.  I'd be happy  to see her on p.r.n. basis.   Quita Skye Hart [place], M.D.  Electronically Signed  JDL/MEDQ  D:  04/13/2008  T:  04/14/2008  Job:  [id number]   cc:   Magnus Sinning. Rice, M.D.  Dr Yolanda Bonine

## 2011-05-09 NOTE — Procedures (Signed)
LOWER EXTREMITY ARTERIAL EVALUATION-SINGLE LEVEL   INDICATION:  Ulcer, left calf.   HISTORY:  Diabetes:  Yes.  Cardiac:  No.  Hypertension:  Yes.  Smoking:  Quit.  Previous Surgery:  No.   RESTING SYSTOLIC PRESSURES: (ABI)                          RIGHT                LEFT  Brachial:               140                  140  Anterior tibial:        182  Posterior tibial:       184 (>1.0)  Peroneal:  DOPPLER WAVEFORM ANALYSIS:  Anterior tibial:        Triphasic            Triphasic  Posterior tibial:       Triphasic            Triphasic  Peroneal:   PREVIOUS ABI'S:  Date:  RIGHT:  LEFT:   IMPRESSION:  1. No evidence of lower evidence arterial occlusive disease      bilaterally.  2. Unable to obtain pressures on the left due to ulcer on left lower      leg; however, Doppler waveforms were within normal limits.   ___________________________________________  V. Charlena Cross, MD   DP/MEDQ  D:  04/13/2008  T:  04/13/2008  Job:  [id number]

## 2011-05-09 NOTE — Assessment & Plan Note (Signed)
[facility]                            CARDIOLOGY OFFICE NOTE   Emily, Wall                      MRN:          [id number]  DATE:09/19/2007                            DOB:          [birth year]    PRIMARY CARE:  Emily Wall, Nurse Practitioner, at [facility].   REASON FOR VISIT:  Follow up cardiomyopathy.   HISTORY OF PRESENT ILLNESS:  Emily Wall comes in for a routine visit.  She reports that she is not having any significant problems with chest  pain and has stable NYHA Class II dyspnea on exertion.  She is overall  pleased with her progress and has been compliant with her medications.  I did switch her from Benicar to lisinopril, for cost reasons, and she  has tolerated this well.  Her blood pressure is well controlled today.  Electrocardiogram shows sinus rhythm with decreased RV progression which  is old.  Her echocardiogram in February demonstrated stable left  ventricular dysfunction at 30 to 40% with mild to moderate aortic valve  stenosis.   ALLERGIES:  No known drug allergies.   PRESENT MEDICATIONS:  1. Lasix 20 mg p.o. daily.  2. Januvia 100 mg p.o. daily.  3. Carvedilol 25 mg p.o. b.i.d.  4. Lisinopril 40 mg p.o. daily.  5. Alendronate 70 mg weekly.  6. Actos 30 mg 1/2 tablet p.o. daily.  7. Metformin 500 mg, two tablets p.o. b.i.d.   REVIEW OF SYSTEMS:  As described in the History of Present Illness.   EXAMINATION:  Blood pressure today is 120/60, heart rate is 74.  Weight  is 164 pounds, down from 180 in February.  The patient is comfortable  and in no acute distress.  EXAMINATION OF THE NECK:  No elevated jugular venous pressure.  LUNGS:  Clear without labored breathing.  CARDIAC EXAM:  A regular rate and rhythm.  A 2/6 systolic murmur heard  at the base.  Second heart sound is preserved.  No S3 gallop.  EXTREMITIES:  No significant pitting edema.   IMPRESSION AND RECOMMENDATION:  1.  Nonischemic cardiomyopathy with mild to moderate aortic stenosis      and previously documented mild coronary atherosclerosis.  The      patient has stable symptomatology and will plan to continue medical      therapy.  I will see her back for symptom review in the next 6      months.  2. Type 2 diabetes mellitus, followed by Emily Wall.  The patient had      some questions about whether her regimen could be adjusted to a      generic regimen overall, which is certainly reasonable given her      financial constraints.  I have asked her to discuss this with Ms.      Daphine Wall.  Would also suggest a lipid profile to ensure that her LDL      control is around 70.  I      suspect that she will potentially need a statin to address this.  3. Hypertension, better control today.  Emily Sidle, MD  Electronically Signed    SGM/MedQ  DD: 09/19/2007  DT: 09/20/2007  Job #: [id number]   cc:   Emily Wall, N.P.

## 2011-05-09 NOTE — Assessment & Plan Note (Signed)
[facility]                          [place] CARDIOLOGY OFFICE NOTE   AVIANAH, PELLMAN                      MRN:          [id number]  DATE:03/04/2009                            DOB:          Jul 21, 1936    PRIMARY CARE Nasia Cannan:  [name], Nurse Practitioner   REASON FOR VISIT:  Routine follow up.   HISTORY OF PRESENT ILLNESS:  I saw Ms. Gergely last in September 2008.  She has a history of a nonischemic cardiomyopathy with subsequent  improvement in systolic function on medical therapy to the range of 50-  55% based on echocardiography from 2009.  She also has mild-to-moderate  aortic valve stenosis with mild aortic regurgitation.  Her mean  transvalvular gradient was 19 mmHg when last assessed.  Symptomatically,  she is stable with NYHA class II dyspnea exertion and no significant  chest pain.  She has had some left arm upper arm discomfort, which  sounds more like a musculoskeletal pain.  She has previous documentation  of mild coronary atherosclerosis by cardiac catheterization in 2007.  She reports compliance with her medications.   ALLERGIES:  No known drug allergies.   PRESENT MEDICATIONS:  1. Aspirin 325 mg p.o. daily.  2. Calcium 600 mg p.o. b.i.d.  3. Vitamin D 1000 international units daily.  4. Metformin 500 mg p.o. b.i.d.  5. Carvedilol 12.5 mg p.o. b.i.d.  6. Lasix 20 mg p.o. daily.  7. Januvia 100 mg p.o. daily.  8. Lisinopril 40 mg p.o. daily.  9. Alendronate 70 mg weekly.   REVIEW OF SYSTEMS:  As described above, otherwise negative.   PHYSICAL EXAMINATION:  VITAL SIGNS:  Blood pressure today 140/84; heart  rate is 71; and weight is 273.2 pounds, which is up from 164 in  September 2008.  GENERAL:  The patient is comfortable and in no acute distress.  HEENT:  Conjunctiva is normal.  Pharynx is clear.  NECK:  Supple.  No elevated jugular venous pressure, possible right  carotid bruit versus radiation of murmur.  No  thyromegaly.  LUNGS:  Clear without labored breathing at rest.  CARDIAC:  Regular rate and rhythm.  A 3/6 systolic murmur heard at the  right base.  Second heart sound is preserved.  No S3 gallop or  pericardial rub.  ABDOMEN:  Soft and nontender with bowel sounds.  EXTREMITIES:  Exhibit no significant pitting edema.  Distal pulses are.  SKIN:  Warm and dry.  No kyphosis noted.  NEUROPSYCHIATRIC:  The patient is alert and oriented x3.  Affect is  appropriate.   A 12-lead electrocardiogram today shows sinus rhythm with decreased  anteroseptal R-wave progression and otherwise nonspecific T-wave  changes.   IMPRESSION AND RECOMMENDATIONS:  1. History of nonischemic cardiomyopathy with improvement in the left      ventricular systolic function to the range of 50-55% based on      echocardiography from last year.  We will plan to continue medical      therapy, and I will arrange a follow up echocardiogram.  Clinical      visit will be in the next  6 months.  2. Mild-to-moderate aortic stenosis with a mild aortic regurgitation.      This will be reassessed with her follow up echocardiogram as well.      She is not reporting any changes in baseline breathing status.  3. Hypertension and diabetes mellitus, followed by Ms. Daphine Deutscher.     Jonelle Sidle, MD  Electronically Signed    SGM/MedQ  DD: 03/04/2009  DT: 03/05/2009  Job #: [id number]   cc:   [name], NP

## 2011-05-12 NOTE — Assessment & Plan Note (Signed)
[facility]                              CARDIOLOGY OFFICE NOTE   Emily Wall, Emily Wall                      MRN:          [id number]  DATE:08/06/2006                            DOB:          22-Jan-1936    REASON FOR THIS FOLLOWUP:  Cardiomyopathy.   HISTORY OF PRESENT ILLNESS:  Emily Wall returns for a routine visit.  She  denies having any limiting dyspnea on exertion or chest pain.  We reviewed  her medications today and discussed their importance.  Blood pressure today  is significantly better than at her last visit.  Electrocardiogram shows  sinus rhythm with decreased R wave progression  and nonspecific ST-T wave  changes.  As we have detailed before, she has mild coronary atherosclerosis  with moderately decreased ejection fraction, likely due to hypertension.  She does not report any problems with significant lower extremity edema at  this point, although recently ran out of her Lasix.   ALLERGIES:  No known drug allergies.   PRESENT MEDICATIONS:  1. Enteric-coated aspirin 325 mg p.o. daily.  2. Actos 15 mg p.o. daily.  3. Janumet 50/500 mg p.o. b.i.d.  4. Lasix 20 mg p.o. daily.  5. Benicar 40 mg p.o. daily.  6. Coreg 12.5 mg p.o. b.i.d.  7. Calcium with vitamin D.   REVIEW OF SYSTEMS:  As found in the history of present illness.  Otherwise,  negative.   PHYSICAL EXAMINATION:  VITAL SIGNS:  Blood pressure 136/80, heart rate 80,  weight 171 pounds (down from 174).  GENERAL:  The patient is comfortable and in no acute distress.  NECK:  No elevated jugular venous pressure or loud bruits.  No thyromegaly  is noted.  LUNGS:  Clear without labored breathing.  CARDIAC:  A regular rate and rhythm with a 2/6 systolic murmur heard at the  base.  S2 is preserved.  There is no S3 gallop.  EXTREMITIES:  No pitting edema.   IMPRESSION:  1. Cardiomyopathy with an ejection fraction of approximately 35-40% in the      setting of hypertension  with only mild coronary atherosclerosis at      catheterization.  Will plan to continue medical therapy with followup      echocardiogram over the next 6 months.  I have      stress the importance of medication compliance and provided a refill      for her Lasix.  2. Type 2 diabetes mellitus and hyperlipidemia, followed by Dr. Dimple Casey.                                Jonelle Sidle, MD    SGM/MedQ  DD:  08/06/2006  DT:  08/06/2006  Job #:  [id number]   cc:   Magnus Sinning. Rice, MD

## 2011-05-12 NOTE — Assessment & Plan Note (Signed)
[facility]                            CARDIOLOGY OFFICE NOTE   JAVAYA, OREGON                      MRN:          [id number]  DATE:01/29/2007                            DOB:          1936/01/01    PRIMARY CARE Sanaia Jasso:  [name], nurse practitioner with [facility].   REASON FOR VISIT:  Follow up cardiomyopathy.   HISTORY OF PRESENT ILLNESS:  Ms. Standard returns today stating that she  is not having any difficulty with dyspnea on exertion or chest  discomfort.  Electrocardiogram shows sinus rhythm with evidence of left  ventricular hypertrophy and decreased R wave progression as noted  previously.  Her medications are outlined below.  Today, we talked about  some adjustments, and she voiced concerns about the cost of Benicar.  She is also due for a followup echocardiogram to reassess left  ventricular function, which was previously assessed at 35%.   ALLERGIES:  No known drug allergies.   PRESENT MEDICATIONS:  1. Enteric-coated aspirin 325 mg p.o. daily.  2. Lasix 20 mg p.o. daily.  3. Benicar 40 mg p.o. daily.  4. Calcium with vitamin D 600 mg p.o. daily.  5. Metformin 1000 mg p.o. b.i.d.  6. Actos 15 mg p.o. daily.  7. Vitamin D supplements.  8. Januvia 100 mg p.o. daily.  9. Carvedilol 25 mg p.o. b.i.d.  10.Fosamax 70 mg weekly.   REVIEW OF SYSTEMS:  As described in the history of present illness.  She  has had no orthopnea, palpitations, peripheral edema, or syncope.   EXAMINATION:  Blood pressure 132/82, heart rate is 75, weight is 180  pounds up from 171.  The patient is comfortable and in no acute distress.  HEENT:  Conjunctivae looks normal.  Oropharynx is clear.  NECK:  Supple without elevated jugular venous pressure or loud bruits.  No thyromegaly is noted.  LUNGS:  Clear without labored breathing at rest.  CARDIAC:  Regular rate and rhythm.  No S3 gallop is noted.  No  pericardial rub.  ABDOMEN:  Soft.  No hepatomegaly.  Bowel sounds are present.  EXTREMITIES:  No significant pitting edema.  Distal pulses are 2+.   IMPRESSION/RECOMMENDATIONS:  1. Nonischemic cardiomyopathy with an ejection fraction previously      assessed at 35-40%.  Only mild coronary atherosclerosis was      documented at catheterization last year.  We have been managing her      medically and she is stable symptomatology with New York Heart      Association class I to II symptoms.  We will plan to change from      Benicar to Zestril 40 mg p.o. daily and follow up with an      echocardiogram to reassess left ventricular function.  I will then      plan to have her return over the next 6 months.  2. Further plan is to follow.     Jonelle Sidle, MD  Electronically Signed    SGM/MedQ  DD: 01/29/2007  DT: 01/29/2007  Job #: [id number]   cc:  [name], NP

## 2011-05-12 NOTE — Cardiovascular Report (Signed)
NAME:  Emily Wall, Emily Wall NO.:  [id number]   MEDICAL RECORD NO.:  [id number]          PATIENT TYPE:  OIB   LOCATION:  1961                         FACILITY:  [facility]   PHYSICIAN:  Arvilla Meres, M.D. LHCDATE OF BIRTH:  12-10-1936   DATE OF PROCEDURE:  06/28/2006  DATE OF DISCHARGE:                              CARDIAC CATHETERIZATION   PRIMARY CARE PHYSICIAN:  Dr. Montey Hora, [facility].   CARDIOLOGIST:  Dr. Simona Huh   PATIENT IDENTIFICATION:  Ms. Kozakiewicz is a 75 year old woman with a history  of diabetes and hypertension.  She recently had some mild lower extremity  edema, underwent echocardiogram that showed an EF of about 35%.  This was  followed by a Myoview study which showed an EF of 39% with hypokinesis of  the inferior wall and a scar from the mid ventricle to the basal level.  She  is thus referred for cardiac catheterization.  She denies any chest pain or  exertional dyspnea.   PROCEDURES PERFORMED:  1.  Selective coronary angiography.  2.  Left heart catheterization.  3.  Left ventriculogram.  4.  Abdominal aortogram to evaluate for renal artery stenosis.   DESCRIPTION OF PROCEDURE:  The risks and benefits of catheterization were  explained.  Consent was signed and placed on the chart.  A 4-French arterial  sheath was placed in the right femoral artery under 1% lidocaine using a  modified Seldinger technique.  Standard catheters including JL-4, 3D-RC and  angled pigtail were used for the procedure.  There are no apparent  complications.  Central aortic pressure is 167/77 with a mean of 114.  LV  pressure is 180/9 with an LVEDP of 18.  There was mild aortic stenosis with  a mean 13-mm gradient across the aortic valve.   Left main was angiographically normal.   LAD was a long vessel coursing to the apex.  It gave off a tiny first  diagonal, a very large second diagonal, a small third diagonal.  There was a  30%  lesion in the proximal LAD and a 30% lesion in the mid section.   Left circumflex was a large tortuous system.  It gave off a large branching  OM-1 and a moderate-sized posterolateral.   Right coronary artery was a large dominant vessel that gave off a very large  PDA and two small posterolaterals.  There is a minor luminal irregularity in  the mid section.   Left ventriculogram done in the RAO position showed apparent moderate LV  dysfunction though this was hard to tell due to the extensive amount of  ectopy.   Abdominal aortogram showed what appeared to be bilateral renal arteries on  the right with a 60-70% ostial stenosis in the upper artery.  The left renal  artery appeared okay.  There was mild to moderate abdominal aortic plaquing.   ASSESSMENT:  1.  Mild nonobstructive coronary artery disease.  2.  Moderate decrease of left ventricular function by echocardiogram.      Suspect hypertensive cardiomyopathy.  3.  Mild aortic stenosis.  4.  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   PLAN/DISCUSSION:  I suspect her cardiomyopathy is hypertensive in nature.  She will need aggressive medical therapy, particularly with attention to her  blood pressure.      Arvilla Meres, M.D. [facility]  Electronically Signed     DB/MEDQ  D:  06/28/2006  T:  06/28/2006  Job:  [id number]   cc:   Magnus Sinning. Rice, M.D.  Fax: [phone]   Jonelle Sidle, M.D. [facility]  [address]  [place]  [place] [address]

## 2011-05-12 NOTE — Assessment & Plan Note (Signed)
[facility]                              CARDIOLOGY OFFICE NOTE   Emily Wall, Emily Wall                      MRN:          [id number]  DATE:07/05/2006                            DOB:          1936-09-15    PRIMARY CARE PHYSICIAN:  Dr. Jonelle Sidle.   This is a 75 year old African American female patient with history of  hypertension and diabetes mellitus, who was found to have mild lower  extremity edema and underwent echo that showed an ejection fraction of about  35%; this was followed by a Myoview study that showed an EF of 39% with  hypokinesis of the inferior wall and scar from the mid ventricle to the  basal level.  She was therefore referred for cardiac catheterization, which  was performed by Dr. Gala Romney on June 28, 2006.  This revealed mild  nonobstructive coronary artery disease with moderate decrease in LV function  echocardiogram; I suspect hypertensive cardiomyopathy.  She had mild aortic  stenosis and right renal artery stenosis in the setting of an apparent dual  right renal.  She had a 30% proximal and mid LAD, tortuous circumflex, large  dominant RCA with minor luminal irregularities in the mid-section, moderate  LV dysfunction, although it was hard due to extensive ectopy.  Medical  therapy was recommended.   Since the patient has been home, she has done well.  She has complained  about the cost of Coreg CR and medications have been a bit confusing to her,  as her pharmacist gave her a different dose of Benicar and her Lasix was  stopped, but she cannot recall who did this.  She denies any shortness of  breath or edema and overall feels well.   CURRENT MEDICATIONS:  1.  Coated aspirin 325 mg daily.  2.  Benicar 20 mg daily.  3.  Coreg CR 10 mg daily.  4.  Actos 15 mg daily.  5.  Janumet 50/500 b.i.d.   PHYSICAL EXAMINATION:  GENERAL:  This is a pleasant 75 year old African  American female in no acute distress.  VITAL SIGNS:  Blood pressure 171/93, pulse 77, weight 174.  NECK:  Without JVD, HJR, bruit or thyroid enlargement.  LUNGS:  Clear, anterior, posterior and lateral.  HEART:  Regular rate and rhythm at 77 beats per minute, normal S1 and S2  with a 2/6 systolic murmur at the left sternal border.  ABDOMEN:  Soft without organomegaly, masses, lesions or abnormal tenderness.  Right groin is without hematoma or hemorrhage.  She has good distal pulses.   IMPRESSION:  1.  Hypertensive cardiomyopathy, ejection fraction 35%, needs better      control.  2.  Mild nonobstructive coronary artery disease.  3.  Mild aortic stenosis.  4.  Type 2 diabetes mellitus.   PLAN:  At this time, I have asked her to restart her Lasix 20 mg once a day  and to go ahead and take her Benicar 40 mg a day that has been given to her.  I have written prescriptions for Coreg CR 20 mg as well as Coreg 12.5 mg  b.i.d. and she will find out which Medicare will help pain for.  We have  given her samples of the CR 20 mg for a week.  I have written all her  medications out in detail and how to take her and she and her daughter seem  to understand.  She will follow up with Dr. Diona Browner in 1 month.  I have  asked her to keep daily blood pressure checks and she will follow up with  Dr. Dimple Casey for her primary medical care.                                  Jacolyn Reedy, PA-C    ML/MedQ  DD:  07/05/2006  DT:  07/05/2006  Job #:  [id number]   cc:   Magnus Sinning. Rice, MD

## 2011-05-15 ENCOUNTER — Encounter: Payer: Self-pay | Admitting: Gastroenterology

## 2011-05-15 ENCOUNTER — Ambulatory Visit (AMBULATORY_SURGERY_CENTER): Payer: Medicare Other | Admitting: Gastroenterology

## 2011-05-15 VITALS — BP 136/67 | HR 65 | Temp 98.3°F | Resp 16 | Ht 66.0 in | Wt 180.0 lb

## 2011-05-15 DIAGNOSIS — D649 Anemia, unspecified: Secondary | ICD-10-CM

## 2011-05-15 DIAGNOSIS — K259 Gastric ulcer, unspecified as acute or chronic, without hemorrhage or perforation: Secondary | ICD-10-CM

## 2011-05-15 DIAGNOSIS — K922 Gastrointestinal hemorrhage, unspecified: Secondary | ICD-10-CM

## 2011-05-15 DIAGNOSIS — K2940 Chronic atrophic gastritis without bleeding: Secondary | ICD-10-CM

## 2011-05-15 LAB — GLUCOSE, CAPILLARY: Glucose-Capillary: 161 mg/dL — ABNORMAL HIGH (ref 70–99)

## 2011-05-15 MED ORDER — SODIUM CHLORIDE 0.9 % IV SOLN: 500.0000 mL | INTRAVENOUS | Status: DC

## 2011-05-15 MED ORDER — FAMOTIDINE 40 MG PO TABS: 40.0000 mg | ORAL_TABLET | Freq: Every evening | ORAL | Status: DC

## 2011-05-15 NOTE — Patient Instructions (Signed)
ULCERS IN THE STOMACH  DR Arlyce Dice CALLED IN PEPCID 4 0MG  ONCE A DAY TO YOUR PHARMACY

## 2011-05-16 ENCOUNTER — Telehealth: Payer: Self-pay | Admitting: *Deleted

## 2011-05-16 NOTE — Telephone Encounter (Signed)

## 2012-01-04 ENCOUNTER — Ambulatory Visit (INDEPENDENT_AMBULATORY_CARE_PROVIDER_SITE_OTHER): Payer: Medicare Other | Admitting: Cardiology

## 2012-01-04 ENCOUNTER — Encounter: Payer: Self-pay | Admitting: Cardiology

## 2012-01-04 DIAGNOSIS — I10 Essential (primary) hypertension: Secondary | ICD-10-CM

## 2012-01-04 DIAGNOSIS — E782 Mixed hyperlipidemia: Secondary | ICD-10-CM

## 2012-01-04 DIAGNOSIS — I428 Other cardiomyopathies: Secondary | ICD-10-CM

## 2012-01-04 DIAGNOSIS — I359 Nonrheumatic aortic valve disorder, unspecified: Secondary | ICD-10-CM

## 2012-01-04 NOTE — Progress Notes (Signed)
Clinical Summary Emily Wall is a 76 y.o.female presenting for followup. She was seen in November 2011. She reports no chest pain or progressive shortness of breath. Indicates compliance with her medications. She continues to followup with Emily Wall.  Followup echocardiogram from December 2011 revealed LVEF of 50-55% with diastolic dysfunction, mild left atrial enlargement, MAC with mild mitral regurgitation, mild aortic stenosis with mild aortic regurgitation, mean gradient 17 mm mercury. We discussed this today.  ECG reviewed, only nonspecific T-wave changes.   No Known Allergies  Current Outpatient Prescriptions  Medication Sig Dispense Refill  . alendronate (FOSAMAX) 70 MG tablet Take 70 mg by mouth every 7 (seven) days. Take with a full glass of water on an empty stomach.       Marland Kitchen aspirin 325 MG tablet Take 325 mg by mouth daily.        Marland Kitchen atorvastatin (LIPITOR) 20 MG tablet Take 20 mg by mouth daily.      . Calcium Carbonate-Vitamin D (CALCIUM + D PO) Take 1 tablet by mouth daily.        . carvedilol (COREG) 25 MG tablet Take 25 mg by mouth 2 (two) times daily with a meal. 1/2 tablet BID       . cholecalciferol (VITAMIN D) 1000 UNITS tablet Take 1,000 Units by mouth 2 (two) times daily.      . ferrous sulfate 325 (65 FE) MG tablet Take 325 mg by mouth daily with breakfast.      . furosemide (LASIX) 20 MG tablet Take 20 mg by mouth daily.        Marland Kitchen lisinopril (PRINIVIL,ZESTRIL) 40 MG tablet Take 40 mg by mouth daily.        . metFORMIN (GLUCOPHAGE) 500 MG tablet Take 500 mg by mouth 2 (two) times daily with a meal.        . sitaGLIPtan (JANUVIA) 100 MG tablet Take 100 mg by mouth daily.         Current Facility-Administered Medications  Medication Dose Route Frequency Provider Last Rate Last Dose  . 0.9 %  sodium chloride infusion  500 mL Intravenous Continuous Louis Meckel, MD        Past Medical History  Diagnosis Date  . Aortic stenosis     Mild to moderate  . Aortic  regurgitation     Mild  . Nonischemic cardiomyopathy     LVEF improved to 50-55%  . Type 2 diabetes mellitus   . Coronary atherosclerosis of native coronary artery     Nonobstructive 2007  . Essential hypertension, benign   . Osteoporosis   . Hypothyroidism     Social History Emily Wall reports that she quit smoking about 23 years ago. Her smoking use included Cigarettes. She has a 15 pack-year smoking history. She has never used smokeless tobacco. Emily Wall reports that she does not drink alcohol.  Review of Systems No palpitations, dizziness, syncope. No orthopnea or PND. Stable appetite. Was negative.  Physical Examination Filed Vitals:   01/04/12 0934  BP: 135/82  Pulse: 71    Overweight woman in no acute distress.  HEENT: Conjunctiva and lids normal, oropharynx with moist mucosa.  Neck: Supple, no elevated JVP or bruits.  Lungs: Clear to auscultation, nonlabored.  Cardiac: Regular rate and rhythm, 3/6 systolic murmur heard at the base, second heart sound preserved, no S3 gallop or pericardial rub, no loud diastolic murmur.  Abdomen: Soft, nontender, bowel sounds present.  Extremities: No pitting edema, distal pulses full.  Skin: Warm and dry.  Musculoskeletal: No gross deformities.  Neuropsychiatric: Alert and oriented x3, affect appropriate.   ECG Sinus rhythm at 67 with nonspecific T-wave changes.  Problem List and Plan

## 2012-01-04 NOTE — Assessment & Plan Note (Signed)
History of nonischemic cardiomyopathy, last LVEF was 50-55% on medical therapy. Will be reassessed per echocardiogram. She reports compliance with her medications.

## 2012-01-04 NOTE — Assessment & Plan Note (Signed)
Followup echocardiogram arranged to reassess degree of stenosis and regurgitation for that matter. Symptomatically stable.

## 2012-01-04 NOTE — Patient Instructions (Signed)
   Echo  If the results of your test are normal or stable, you will receive a letter.  If they are abnormal, the nurse will contact you by phone. Your physician wants you to follow up in:  1 year.  You will receive a reminder letter in the mail one-two months in advance.  If you don't receive a letter, please call our office to schedule the follow up appointment  

## 2012-01-04 NOTE — Assessment & Plan Note (Signed)
Medications reviewed. No changes made. Reminded her about exercise and sodium restriction.

## 2012-01-10 ENCOUNTER — Telehealth: Payer: Self-pay | Admitting: *Deleted

## 2012-01-10 ENCOUNTER — Other Ambulatory Visit (INDEPENDENT_AMBULATORY_CARE_PROVIDER_SITE_OTHER): Payer: Medicare Other | Admitting: *Deleted

## 2012-01-10 DIAGNOSIS — I359 Nonrheumatic aortic valve disorder, unspecified: Secondary | ICD-10-CM

## 2012-01-10 DIAGNOSIS — I428 Other cardiomyopathies: Secondary | ICD-10-CM

## 2012-01-10 NOTE — Telephone Encounter (Signed)
Message copied by Arlyss Gandy on Wed Jan 10, 2012  3:21 PM ------      Message from: MCDOWELL, Illene Bolus      Created: Wed Jan 10, 2012 12:48 PM       Reviewed. LVEF is stable at 50-55%. Moderate aortic stenosis with mild aortic regurgitation. Continue observation for now.

## 2012-01-10 NOTE — Telephone Encounter (Signed)
Pt notified of results and verbalized understanding  

## 2012-09-30 ENCOUNTER — Encounter: Payer: Self-pay | Admitting: Vascular Surgery

## 2012-10-01 ENCOUNTER — Encounter (INDEPENDENT_AMBULATORY_CARE_PROVIDER_SITE_OTHER): Payer: Medicare Other | Admitting: *Deleted

## 2012-10-01 ENCOUNTER — Ambulatory Visit (INDEPENDENT_AMBULATORY_CARE_PROVIDER_SITE_OTHER): Payer: Medicare Other | Admitting: Vascular Surgery

## 2012-10-01 ENCOUNTER — Encounter: Payer: Self-pay | Admitting: Vascular Surgery

## 2012-10-01 ENCOUNTER — Ambulatory Visit (INDEPENDENT_AMBULATORY_CARE_PROVIDER_SITE_OTHER): Payer: Medicare Other | Admitting: *Deleted

## 2012-10-01 VITALS — BP 139/80 | HR 90 | Resp 16 | Ht 66.0 in | Wt 178.0 lb

## 2012-10-01 DIAGNOSIS — L97909 Non-pressure chronic ulcer of unspecified part of unspecified lower leg with unspecified severity: Secondary | ICD-10-CM

## 2012-10-01 DIAGNOSIS — L98499 Non-pressure chronic ulcer of skin of other sites with unspecified severity: Secondary | ICD-10-CM | POA: Insufficient documentation

## 2012-10-01 NOTE — Progress Notes (Signed)
Subjective:     Patient ID: Emily Wall, female   DOB: [birth year], 76 y.o.   MRN: [id number]  HPI this 76 year old female was referred by Dr. Malcolm Metro from the wound center in [place] for evaluation of extensive right pretibial ulcer. Patient had a similar ulcer not as extensive on the left leg in 2009 which was traumatic in origin and was evaluated in this office and found to have normal arterial circulation. The ulcer eventually healed. She is unclear on how this ulcer developed on the right side but probably from scratching the skin. She has been seen in the wound center for 3 or 4 months she states. She thinks the ulcer is improved.  Past Medical History  Diagnosis Date  . Aortic stenosis     Mild to moderate  . Aortic regurgitation     Mild  . Nonischemic cardiomyopathy     LVEF improved to 50-55%  . Type 2 diabetes mellitus   . Coronary atherosclerosis of native coronary artery     Nonobstructive 2007  . Essential hypertension, benign   . Osteoporosis   . Hypothyroidism     History  Substance Use Topics  . Smoking status: Former Smoker -- 0.5 packs/day for 30 years    Types: Cigarettes    Quit date: 12/25/1988  . Smokeless tobacco: Never Used  . Alcohol Use: No    Family History  Problem Relation Age of Onset  . Colon cancer Mother   . Colon cancer Brother     No Known Allergies  Current outpatient prescriptions:alendronate (FOSAMAX) 70 MG tablet, Take 70 mg by mouth every 7 (seven) days. Take with a full glass of water on an empty stomach. , Disp: , Rfl: ;  aspirin 325 MG tablet, Take 325 mg by mouth daily.  , Disp: , Rfl: ;  atorvastatin (LIPITOR) 20 MG tablet, Take 20 mg by mouth daily., Disp: , Rfl: ;  Calcium Carbonate-Vitamin D (CALCIUM + D PO), Take 1 tablet by mouth daily.  , Disp: , Rfl:  carvedilol (COREG) 25 MG tablet, Take 25 mg by mouth 2 (two) times daily with a meal. 1/2 tablet BID , Disp: , Rfl: ;  cholecalciferol (VITAMIN D) 1000 UNITS tablet,  Take 1,000 Units by mouth 2 (two) times daily., Disp: , Rfl: ;  doxycycline (VIBRAMYCIN) 100 MG capsule, Take 100 mg by mouth 2 (two) times daily., Disp: , Rfl: ;  ferrous sulfate 325 (65 FE) MG tablet, Take 325 mg by mouth daily with breakfast., Disp: , Rfl:  furosemide (LASIX) 20 MG tablet, Take 20 mg by mouth daily.  , Disp: , Rfl: ;  HYDROcodone-acetaminophen (NORCO) 10-325 MG per tablet, Take 1 tablet by mouth every 6 (six) hours as needed., Disp: , Rfl: ;  lisinopril (PRINIVIL,ZESTRIL) 40 MG tablet, Take 40 mg by mouth daily.  , Disp: , Rfl: ;  metFORMIN (GLUCOPHAGE) 500 MG tablet, Take 500 mg by mouth 2 (two) times daily with a meal.  , Disp: , Rfl:  sitaGLIPtan (JANUVIA) 100 MG tablet, Take 100 mg by mouth daily.  , Disp: , Rfl: ;  sulfamethoxazole-trimethoprim (BACTRIM DS,SEPTRA DS) 800-160 MG per tablet, Take 1 tablet by mouth 2 (two) times daily., Disp: , Rfl:  Current facility-administered medications:0.9 %  sodium chloride infusion, 500 mL, Intravenous, Continuous, Louis Meckel, MD  BP 139/80  Pulse 90  Resp 16  Ht 5\' 6"  (1.676 m)  Wt 178 lb (80.74 kg)  BMI 28.73 kg/m2  Body mass index  is 28.73 kg/(m^2).          Review of Systems patient complains of rashes and ulcerations but denies chest pain area she does have mild dyspnea on exertion. Does not ambulate very much. Other systems negative and complete review of systems     Objective:   Physical Exam blood pressure 139/80 heart rate 90 respirations 16 Gen.-alert and oriented x3 in no apparent distress HEENT normal for age Lungs no rhonchi or wheezing Cardiovascular regular rhythm no murmurs carotid pulses 3+ palpable no bruits audible Abdomen soft nontender no palpable masses Musculoskeletal free of  major deformities Skin clear -no rashes Neurologic normal Lower extremities 3+ femoral pulses 2+ popliteal pulses 2+ dorsalis pedis pulses palpable bilaterally. Left leg is free of ulcerations. Right leg has extensive  pretibial ulcer with exposed extensor tendon distally. The ulcer is about 12 x 3-4 cm in diameter. It does have pink granulation tissue proximally. No fluctuance or cellulitis is noted.  Today I ordered lower extremity arterial Dopplers with normal ABIs bilaterally. I interpreted these studies. Also ordered a venous study which showed no evidence of DVT and only minimal reflux in the right great saphenous vein and a small caliber line.      Assessment:     Traumatic ulceration right pretibial area with exposed extensor tendon-no evidence of extensive or severe arterial or venous insufficiency    Plan:     Will [facility] for Dr. Tanda Rockers to continue care. May need orthopedic surgical consultation to excise extensor tendon and attempt split-thickness skin graft

## 2013-03-20 ENCOUNTER — Encounter: Payer: Self-pay | Admitting: *Deleted

## 2013-03-26 ENCOUNTER — Ambulatory Visit (INDEPENDENT_AMBULATORY_CARE_PROVIDER_SITE_OTHER): Payer: Medicare Other | Admitting: Cardiology

## 2013-03-26 ENCOUNTER — Encounter: Payer: Self-pay | Admitting: Cardiology

## 2013-03-26 VITALS — BP 147/78 | HR 82 | Ht 66.0 in | Wt 164.0 lb

## 2013-03-26 DIAGNOSIS — I429 Cardiomyopathy, unspecified: Secondary | ICD-10-CM

## 2013-03-26 DIAGNOSIS — I359 Nonrheumatic aortic valve disorder, unspecified: Secondary | ICD-10-CM

## 2013-03-26 DIAGNOSIS — I350 Nonrheumatic aortic (valve) stenosis: Secondary | ICD-10-CM

## 2013-03-26 NOTE — Progress Notes (Signed)
Clinical Summary Ms. Migliaccio is a 77 y.o.female last seen in January 2013. She reports no progressive cardiac symptoms, stable dyspnea on exertion.  Echocardiogram in January 2013 showed moderate LVH with LVEF 50-55%, grade 1 diastolic dysfunction, moderate aortic stenosis with mild aortic regurgitation. Overall stable findings.  Medications were reviewed.  She states that she had right leg surgery back in November, just now getting back to baseline where she can be some walking on a regular basis.  No Known Allergies  Current Outpatient Prescriptions  Medication Sig Dispense Refill  . alendronate (FOSAMAX) 70 MG tablet Take 70 mg by mouth every 7 (seven) days. Take with a full glass of water on an empty stomach.       Marland Kitchen aspirin 325 MG tablet Take 325 mg by mouth daily.        Marland Kitchen atorvastatin (LIPITOR) 20 MG tablet Take 20 mg by mouth daily.      . Calcium Carbonate-Vitamin D (CALCIUM + D PO) Take 1 tablet by mouth daily.        . carvedilol (COREG) 25 MG tablet Take 25 mg by mouth 2 (two) times daily with a meal. 1/2 tablet BID       . cholecalciferol (VITAMIN D) 1000 UNITS tablet Take 1,000 Units by mouth 2 (two) times daily.      Marland Kitchen doxycycline (VIBRAMYCIN) 100 MG capsule Take 100 mg by mouth 2 (two) times daily.      . ferrous sulfate 325 (65 FE) MG tablet Take 325 mg by mouth daily with breakfast.      . furosemide (LASIX) 20 MG tablet Take 20 mg by mouth daily.        Marland Kitchen HYDROcodone-acetaminophen (NORCO) 10-325 MG per tablet Take 1 tablet by mouth every 6 (six) hours as needed.      Marland Kitchen lisinopril (PRINIVIL,ZESTRIL) 40 MG tablet Take 40 mg by mouth daily.        . metFORMIN (GLUCOPHAGE) 500 MG tablet Take 500 mg by mouth 2 (two) times daily with a meal.        . sitaGLIPtan (JANUVIA) 100 MG tablet Take 100 mg by mouth daily.        Marland Kitchen triamcinolone cream (KENALOG) 0.1 % Apply 1 application topically daily.        Current Facility-Administered Medications  Medication Dose Route  Frequency Provider Last Rate Last Dose  . 0.9 %  sodium chloride infusion  500 mL Intravenous Continuous Louis Meckel, MD        Past Medical History  Diagnosis Date  . Aortic stenosis     Mild to moderate  . Aortic regurgitation     Mild  . Nonischemic cardiomyopathy     LVEF improved to 50-55%  . Type 2 diabetes mellitus   . Coronary atherosclerosis of native coronary artery     Nonobstructive 2007  . Essential hypertension, benign   . Osteoporosis   . Hypothyroidism     Social History Ms. Weightman reports that she quit smoking about 24 years ago. Her smoking use included Cigarettes. She has a 15 pack-year smoking history. She has never used smokeless tobacco. Ms. Shannahan reports that she does not drink alcohol.  Review of Systems No palpitations or syncope. Otherwise negative except as outlined.  Physical Examination Filed Vitals:   03/26/13 1106  BP: 147/78  Pulse: 82   Filed Weights   03/26/13 1106  Weight: 164 lb (74.39 kg)    Overweight woman in no acute distress.  HEENT: Conjunctiva and lids normal, oropharynx with moist mucosa.  Neck: Supple, no elevated JVP or bruits.  Lungs: Clear to auscultation, nonlabored.  Cardiac: Regular rate and rhythm, 3/6 systolic murmur heard at the base, second heart sound preserved, no S3 gallop or pericardial rub, no loud diastolic murmur.  Abdomen: Soft, nontender, bowel sounds present.  Extremities: No pitting edema, distal pulses full.    Problem List and Plan   AORTIC STENOSIS Moderate aortic stenosis with mild regurgitation by echocardiogram last in January. Symptoms and murmur are stable. We will plan a clinical followup next January with echocardiogram at that time.  CARDIOMYOPATHY, SECONDARY Symptomatically stable on medical therapy, LVEF 50-55%.    Jonelle Sidle, M.D., F.A.C.C.

## 2013-03-26 NOTE — Assessment & Plan Note (Signed)
Moderate aortic stenosis with mild regurgitation by echocardiogram last in January. Symptoms and murmur are stable. We will plan a clinical followup next January with echocardiogram at that time.

## 2013-03-26 NOTE — Assessment & Plan Note (Signed)
Symptomatically stable on medical therapy, LVEF 50-55%.

## 2013-03-26 NOTE — Patient Instructions (Addendum)
Your physician recommends that you schedule a follow-up appointment in: 10 months. You will receive a reminder letter in the mail in about 8 months reminding you to call and schedule your appointment. If you don't receive this letter, please contact our office. Your physician recommends that you continue on your current medications as directed. Please refer to the Current Medication list given to you today. Your physician has requested that you have an echocardiogram in January 2015 just before your next appointment. Echocardiography is a painless test that uses sound waves to create images of your heart. It provides your doctor with information about the size and shape of your heart and how well your heart's chambers and valves are working. This procedure takes approximately one hour. There are no restrictions for this procedure.

## 2013-03-30 ENCOUNTER — Other Ambulatory Visit: Payer: Self-pay | Admitting: Nurse Practitioner

## 2013-04-21 ENCOUNTER — Other Ambulatory Visit: Payer: Self-pay | Admitting: Nurse Practitioner

## 2013-05-21 ENCOUNTER — Other Ambulatory Visit: Payer: Self-pay | Admitting: Nurse Practitioner

## 2013-05-21 ENCOUNTER — Telehealth: Payer: Self-pay | Admitting: Nurse Practitioner

## 2013-05-21 NOTE — Telephone Encounter (Signed)
Samples up front 

## 2013-06-02 ENCOUNTER — Ambulatory Visit (INDEPENDENT_AMBULATORY_CARE_PROVIDER_SITE_OTHER): Payer: Medicare Other | Admitting: Nurse Practitioner

## 2013-06-02 ENCOUNTER — Encounter: Payer: Self-pay | Admitting: Nurse Practitioner

## 2013-06-02 VITALS — BP 132/88 | HR 69 | Temp 98.1°F | Ht 65.0 in | Wt 164.0 lb

## 2013-06-02 DIAGNOSIS — E119 Type 2 diabetes mellitus without complications: Secondary | ICD-10-CM

## 2013-06-02 DIAGNOSIS — D509 Iron deficiency anemia, unspecified: Secondary | ICD-10-CM

## 2013-06-02 DIAGNOSIS — E785 Hyperlipidemia, unspecified: Secondary | ICD-10-CM | POA: Insufficient documentation

## 2013-06-02 DIAGNOSIS — M810 Age-related osteoporosis without current pathological fracture: Secondary | ICD-10-CM

## 2013-06-02 DIAGNOSIS — I10 Essential (primary) hypertension: Secondary | ICD-10-CM

## 2013-06-02 LAB — COMPLETE METABOLIC PANEL WITH GFR
ALT: 8 U/L (ref 0–35)
Albumin: 3.6 g/dL (ref 3.5–5.2)
CO2: 31 mEq/L (ref 19–32)
Calcium: 9.1 mg/dL (ref 8.4–10.5)
Chloride: 102 mEq/L (ref 96–112)
GFR, Est African American: >89 mL/min
GFR, Est Non African American: 80 mL/min
Glucose, Bld: 137 mg/dL — ABNORMAL HIGH (ref 70–99)
Potassium: 4.2 mEq/L (ref 3.5–5.3)
Sodium: 139 mEq/L (ref 135–145)
Total Protein: 6.7 g/dL (ref 6.0–8.3)

## 2013-06-02 LAB — ANEMIA PANEL 7
ABS Retic: 36.1 10*3/uL (ref 19.0–186.0)
Folate: >20 ng/mL
HCT: 34.8 % — ABNORMAL LOW (ref 36.0–46.0)
Hemoglobin: 11.7 g/dL — ABNORMAL LOW (ref 12.0–15.0)
Iron: 56 ug/dL (ref 42–145)
MCHC: 33.6 g/dL (ref 30.0–36.0)
RBC.: 4.01 MIL/uL (ref 3.87–5.11)
RBC: 4.01 MIL/uL (ref 3.87–5.11)
Retic Ct Pct: 0.9 % (ref 0.4–2.3)
TIBC: 267 ug/dL (ref 250–470)
UIBC: 211 ug/dL (ref 125–400)

## 2013-06-02 LAB — POCT GLYCOSYLATED HEMOGLOBIN (HGB A1C): Hemoglobin A1C: 7.1

## 2013-06-02 MED ORDER — METFORMIN HCL 1000 MG PO TABS: 1000.0000 mg | ORAL_TABLET | Freq: Two times a day (BID) | ORAL | Status: DC

## 2013-06-02 MED ORDER — ALENDRONATE SODIUM 70 MG PO TABS: 70.0000 mg | ORAL_TABLET | ORAL | Status: DC

## 2013-06-02 MED ORDER — ATORVASTATIN CALCIUM 20 MG PO TABS: 20.0000 mg | ORAL_TABLET | Freq: Every day | ORAL | Status: DC

## 2013-06-02 MED ORDER — LISINOPRIL 40 MG PO TABS: 40.0000 mg | ORAL_TABLET | Freq: Every day | ORAL | Status: DC

## 2013-06-02 MED ORDER — CARVEDILOL 25 MG PO TABS: 25.0000 mg | ORAL_TABLET | Freq: Two times a day (BID) | ORAL | Status: DC

## 2013-06-02 MED ORDER — FERROUS SULFATE 325 (65 FE) MG PO TABS: 325.0000 mg | ORAL_TABLET | Freq: Every day | ORAL | Status: DC

## 2013-06-02 NOTE — Patient Instructions (Signed)

## 2013-06-02 NOTE — Progress Notes (Signed)
Subjective:    Patient ID: Emily Wall, female    DOB: [birth year], 77 y.o.   MRN: [id number]  Hypertension This is a chronic problem. The current episode started more than 1 year ago. The problem is unchanged. The problem is uncontrolled. Pertinent negatives include no blurred vision, chest pain, headaches, malaise/fatigue, orthopnea, palpitations, peripheral edema, shortness of breath or sweats. There are no associated agents to hypertension. Risk factors for coronary artery disease include diabetes mellitus, dyslipidemia, obesity, sedentary lifestyle and post-menopausal state. Past treatments include ACE inhibitors, beta blockers and diuretics. The current treatment provides moderate improvement. Compliance problems include diet and exercise.   Hyperlipidemia This is a chronic problem. The current episode started more than 1 year ago. The problem is controlled. Recent lipid tests were reviewed and are normal. She has no history of diabetes. Pertinent negatives include no chest pain, leg pain, myalgias or shortness of breath. Current antihyperlipidemic treatment includes statins. The current treatment provides significant improvement of lipids. There are no compliance problems.  Risk factors for coronary artery disease include diabetes mellitus, hypertension, post-menopausal and a sedentary lifestyle.  Diabetes She presents for her follow-up diabetic visit. She has type 2 diabetes mellitus. No MedicAlert identification noted. The initial diagnosis of diabetes was made 5 years ago. Her disease course has been stable. There are no hypoglycemic associated symptoms. Pertinent negatives for hypoglycemia include no headaches or sweats. Pertinent negatives for diabetes include no blurred vision, no chest pain, no polydipsia, no polyphagia, no polyuria, no visual change, no weakness and no weight loss. There are no hypoglycemic complications. Symptoms are stable. There are no diabetic complications. Risk  factors for coronary artery disease include dyslipidemia, hypertension, post-menopausal and sedentary lifestyle. Current diabetic treatment includes oral agent (dual therapy). She is compliant with treatment all of the time. Her weight is stable. She is following a generally healthy diet. When asked about meal planning, she reported none. She has not had a previous visit with a dietician. She rarely participates in exercise. Home blood sugar record trend: Patient doesn't shcek blood sugars at home. An ACE inhibitor/angiotensin II receptor blocker is being taken. She does not see a podiatrist.Eye exam is not current (greate rthan 2 years- Patient instructed to make appointment.).  Peripheral edema Lasix daily- keeps swelling down. Anemia Ferrous sulfate daily  Review of Systems  Constitutional: Negative for weight loss and malaise/fatigue.  Eyes: Negative for blurred vision.  Respiratory: Negative for shortness of breath.   Cardiovascular: Negative for chest pain, palpitations and orthopnea.  Endocrine: Negative for polydipsia, polyphagia and polyuria.  Musculoskeletal: Negative for myalgias.  Neurological: Negative for weakness and headaches.  All other systems reviewed and are negative.       Objective:   Physical Exam  Constitutional: She is oriented to person, place, and time. She appears well-developed and well-nourished.  HENT:  Nose: Nose normal.  Mouth/Throat: Oropharynx is clear and moist.  Eyes: EOM are normal.  Neck: Trachea normal, normal range of motion and full passive range of motion without pain. Neck supple. No JVD present. Carotid bruit is not present. No thyromegaly present.  Cardiovascular: Normal rate, regular rhythm, normal heart sounds and intact distal pulses.  Exam reveals no gallop and no friction rub.   No murmur heard. Pulmonary/Chest: Effort normal and breath sounds normal.  Abdominal: Soft. Bowel sounds are normal. She exhibits no distension and no mass.  There is no tenderness.  Musculoskeletal: Normal range of motion.  Lymphadenopathy:    She has no cervical adenopathy.  Neurological: She is alert and oriented to person, place, and time. She has normal reflexes.  Skin: Skin is warm and dry.  Psychiatric: She has a normal mood and affect. Her behavior is normal. Judgment and thought content normal.    BP 150/78  Pulse 69  Temp(Src) 98.1 F (36.7 C) (Oral)  Ht 5\' 5"  (1.651 m)  Wt 164 lb (74.39 kg)  BMI 27.29 kg/m2 Results for orders placed in visit on 06/02/13  POCT GLYCOSYLATED HEMOGLOBIN (HGB A1C)      Result Value Range   Hemoglobin A1C 7.1           Assessment & Plan:   1. HYPERTENSION, UNSPECIFIED   2. Diabetes mellitus   3. Other and unspecified hyperlipidemia   4. Anemia, iron deficiency    Orders Placed This Encounter  Procedures  . COMPLETE METABOLIC PANEL WITH GFR  . NMR Lipoprofile with Lipids  . POCT glycosylated hemoglobin (Hb A1C)   Current Outpatient Prescriptions on File Prior to Visit  Medication Sig Dispense Refill  . aspirin 325 MG tablet Take 325 mg by mouth daily.        . Calcium Carbonate-Vitamin D (CALCIUM + D PO) Take 1 tablet by mouth daily.        . cholecalciferol (VITAMIN D) 1000 UNITS tablet Take 1,000 Units by mouth 2 (two) times daily.      . furosemide (LASIX) 20 MG tablet Take 20 mg by mouth daily.        . sitaGLIPtan (JANUVIA) 100 MG tablet Take 100 mg by mouth daily.        Marland Kitchen triamcinolone cream (KENALOG) 0.1 % Apply 1 application topically daily.        Current Facility-Administered Medications on File Prior to Visit  Medication Dose Route Frequency Provider Last Rate Last Dose  . 0.9 %  sodium chloride infusion  500 mL Intravenous Continuous Louis Meckel, MD       Continue all meds  Labs pending Diet and exercise encouraged Follow- up in 3 months  Mary-Margaret Daphine Deutscher, FNP

## 2013-06-03 ENCOUNTER — Telehealth: Payer: Self-pay | Admitting: Nurse Practitioner

## 2013-06-03 LAB — NMR LIPOPROFILE WITH LIPIDS
Cholesterol, Total: 119 mg/dL (ref <200)
HDL Particle Number: 32.1 umol/L (ref >=30.5)
LDL (calc): 59 mg/dL (ref <100)
LP-IR Score: 37 (ref <=45)
Small LDL Particle Number: 417 nmol/L (ref <=527)
Triglycerides: 41 mg/dL (ref <150)

## 2013-06-03 NOTE — Telephone Encounter (Signed)
Pt aware of results and need to f/u.

## 2013-06-09 ENCOUNTER — Other Ambulatory Visit (INDEPENDENT_AMBULATORY_CARE_PROVIDER_SITE_OTHER): Payer: Medicare Other

## 2013-06-09 DIAGNOSIS — Z1212 Encounter for screening for malignant neoplasm of rectum: Secondary | ICD-10-CM

## 2013-06-10 ENCOUNTER — Telehealth: Payer: Self-pay | Admitting: *Deleted

## 2013-06-10 DIAGNOSIS — K921 Melena: Secondary | ICD-10-CM

## 2013-06-10 LAB — FECAL OCCULT BLOOD, IMMUNOCHEMICAL: Fecal Occult Blood: POSITIVE — AB

## 2013-06-10 NOTE — Telephone Encounter (Signed)
Pt aware of lab results. She had her last colonoscopy in [place] and would like to go there.

## 2013-06-10 NOTE — Telephone Encounter (Signed)
referral made.

## 2013-06-30 ENCOUNTER — Ambulatory Visit (INDEPENDENT_AMBULATORY_CARE_PROVIDER_SITE_OTHER): Payer: Medicare Other | Admitting: Gastroenterology

## 2013-06-30 ENCOUNTER — Encounter: Payer: Self-pay | Admitting: Gastroenterology

## 2013-06-30 VITALS — BP 138/80 | HR 80 | Ht 66.0 in | Wt 163.4 lb

## 2013-06-30 DIAGNOSIS — R195 Other fecal abnormalities: Secondary | ICD-10-CM

## 2013-06-30 DIAGNOSIS — Z800 Family history of malignant neoplasm of digestive organs: Secondary | ICD-10-CM

## 2013-06-30 NOTE — Assessment & Plan Note (Signed)
Follow-up colonoscopy 2017 

## 2013-06-30 NOTE — Patient Instructions (Addendum)
You have been scheduled for your procedure Separate instructions have been given

## 2013-06-30 NOTE — Progress Notes (Signed)
History of Present Illness: Pleasant 77 year old Afro-American female referred at the request of Dr. Christell Constant for Hemoccult-positive stool.  This was noted on routine testing. She has no GI complaints including change of bowel habits, abdominal pain, melena or hematochezia. She's on no gastric irritants including nonsteroidals. In 2012 she underwent workup for an iron deficiency anemia. Colonoscopy in 2012 demonstrated diverticulosis. In 2012 she underwent band ligation of a bleeding hyperplastic gastric polyp. It was thought that her bleeding was from the gastric polyp.  Patient takes iron.  Blood work last month demonstrated a hemoglobin of 11.7.  Ferritin was 38. Folate and B12 levels were normal.    Past Medical History  Diagnosis Date  . Aortic stenosis     Mild to moderate  . Aortic regurgitation     Mild  . Nonischemic cardiomyopathy     LVEF improved to 50-55%  . Type 2 diabetes mellitus   . Coronary atherosclerosis of native coronary artery     Nonobstructive 2007  . Essential hypertension, benign   . Osteoporosis   . Hypothyroidism    Past Surgical History  Procedure Laterality Date  . Skin graft     family history includes Colon cancer in her brother and mother; Diabetes in her brother; and Prostate cancer in her brother. Current Outpatient Prescriptions  Medication Sig Dispense Refill  . alendronate (FOSAMAX) 70 MG tablet Take 1 tablet (70 mg total) by mouth every 7 (seven) days. Take with a full glass of water on an empty stomach.  4 tablet  5  . aspirin 325 MG tablet Take 325 mg by mouth daily.        Marland Kitchen atorvastatin (LIPITOR) 20 MG tablet Take 1 tablet (20 mg total) by mouth daily.  30 tablet  5  . Calcium Carbonate-Vitamin D (CALCIUM + D PO) Take 1 tablet by mouth daily.        . carvedilol (COREG) 25 MG tablet Take 1 tablet (25 mg total) by mouth 2 (two) times daily with a meal.  60 tablet  5  . cholecalciferol (VITAMIN D) 1000 UNITS tablet Take 1,000 Units by mouth 2  (two) times daily.      . ferrous sulfate 325 (65 FE) MG tablet Take 1 tablet (325 mg total) by mouth daily with breakfast.  30 tablet  5  . furosemide (LASIX) 20 MG tablet Take 20 mg by mouth daily.        Marland Kitchen glucose blood test strip       . LANCETS ULTRA THIN MISC       . lisinopril (PRINIVIL,ZESTRIL) 40 MG tablet Take 1 tablet (40 mg total) by mouth daily.  30 tablet  5  . metFORMIN (GLUCOPHAGE) 1000 MG tablet Take 1 tablet (1,000 mg total) by mouth 2 (two) times daily with a meal.  60 tablet  5  . sitaGLIPtan (JANUVIA) 100 MG tablet Take 100 mg by mouth daily.        Marland Kitchen triamcinolone cream (KENALOG) 0.1 % Apply 1 application topically daily.        Current Facility-Administered Medications  Medication Dose Route Frequency Provider Last Rate Last Dose  . 0.9 %  sodium chloride infusion  500 mL Intravenous Continuous Louis Meckel, MD       Allergies as of 06/30/2013  . (No Known Allergies)    reports that she quit smoking about 24 years ago. Her smoking use included Cigarettes. She has a 15 pack-year smoking history. She has never used smokeless  tobacco. She reports that she does not drink alcohol or use illicit drugs.     Review of Systems: Pertinent positive and negative review of systems were noted in the above HPI section. All other review of systems were otherwise negative.  Vital signs were reviewed in today's medical record Physical Exam: General: Well developed , well nourished, no acute distress Skin: anicteric Head: Normocephalic and atraumatic Eyes:  sclerae anicteric, EOMI Ears: Normal auditory acuity Mouth: No deformity or lesions Neck: Supple, no masses or thyromegaly Lungs: Clear throughout to auscultation Heart: Regular rate and rhythm; no  rubs or bruits.  There is a 2/6 early systolic murmur Abdomen: Soft, non tender and non distended. No masses, hepatosplenomegaly or hernias noted. Normal Bowel sounds Rectal:deferred Musculoskeletal: Symmetrical with no  gross deformities  Skin: No lesions on visible extremities Pulses:  Normal pulses noted Extremities: No clubbing, cyanosis, edema or deformities noted Neurological: Alert oriented x 4, grossly nonfocal Cervical Nodes:  No significant cervical adenopathy Inguinal Nodes: No significant inguinal adenopathy Psychological:  Alert and cooperative. Normal mood and affect

## 2013-06-30 NOTE — Assessment & Plan Note (Addendum)
Hemoccult-positive stool may relate to her iron deficiency anemia for which she underwent workup 2 years ago. Anemia was attributed to a bleeding gastric polyp. With history of aortic stenosis and heme positive stool I am suspicious that the patient may have a small bowel bleeding source from AVMs. Recurrent gastric polyps are also a consideration. Doubt upper GI or colonic neoplasm.  Recommendations #1 upper endoscopy and enteroscopy; if not diagnostic for a GI bleeding source will proceed with capsule endoscopy

## 2013-07-04 ENCOUNTER — Encounter: Payer: Self-pay | Admitting: Gastroenterology

## 2013-07-04 ENCOUNTER — Ambulatory Visit (AMBULATORY_SURGERY_CENTER): Payer: Medicare Other | Admitting: Gastroenterology

## 2013-07-04 VITALS — BP 148/74 | HR 59 | Temp 97.8°F | Resp 17 | Ht 66.0 in | Wt 163.0 lb

## 2013-07-04 DIAGNOSIS — R195 Other fecal abnormalities: Secondary | ICD-10-CM

## 2013-07-04 LAB — GLUCOSE, CAPILLARY: Glucose-Capillary: 110 mg/dL — ABNORMAL HIGH (ref 70–99)

## 2013-07-04 MED ORDER — SODIUM CHLORIDE 0.9 % IV SOLN: 500.0000 mL | INTRAVENOUS | Status: DC

## 2013-07-04 NOTE — Progress Notes (Signed)
Procedure ends, to recovery, report given, VSS. 

## 2013-07-04 NOTE — Op Note (Signed)
[facility] [address] [place], [address]   OPERATIVE PROCEDURE REPORT  PATIENT: [name], Emily Wall  MR#: [id number] BIRTHDATE: 08/24/1936 , 76  yrs. old GENDER: Female ENDOSCOPIST: Louis Meckel, MD REFERRED BY:  Rudi Heap, M.D. PROCEDURE DATE: 07/04/2013 PROCEDURE:   Diagnostic small bowel enteroscopy ASA CLASS:   Class II INDICATIONS:1.  hemoquant positive stools.   2.  anemia. MEDICATIONS: MAC sedation, administered by CRNA, Propofol (Diprivan) 140 mg IV, and Simethicone 0.6cc PO TOPICAL ANESTHETIC:   Cetacaine Spray  DESCRIPTION OF PROCEDURE:   After the risks benefits and alternatives of the procedure were thoroughly explained, informed consent was obtained.  The     endoscope was introduced through the mouth  and advanced to the proximal jejunum jejunum , limited by Without limitations.   The instrument was slowly withdrawn as the mucosa was fully examined.  No because of abnormalities were seen. Specifically, there were no AVMs, alternatives or residual gastric polyps.    Retroflexed views revealed no abnormalities.    The scope was then withdrawn from the patient and the procedure terminated.  COMPLICATIONS: There were no complications. ENDOSCOPIC IMPRESSION: normal endoscopy and enteroscopy to the proximal jejunum  RECOMMENDATIONS: capsule endoscopy REPEAT EXAM:  _______________________________ eSignedLouis Meckel, MD 07/04/2013 11:00 AM   CC:

## 2013-07-04 NOTE — Progress Notes (Signed)
Patient did not experience any of the following events: a burn prior to discharge; a fall within the facility; wrong site/side/patient/procedure/implant event; or a hospital transfer or hospital admission upon discharge from the facility. (G8907) Patient did not have preoperative order for IV antibiotic SSI prophylaxis. (G8918)  

## 2013-07-04 NOTE — Patient Instructions (Signed)
YOU HAD AN ENDOSCOPIC PROCEDURE TODAY AT THE [facility]: Refer to the procedure report that was given to you for any specific questions about what was found during the examination.  If the procedure report does not answer your questions, please call your gastroenterologist to clarify.  If you requested that your care partner not be given the details of your procedure findings, then the procedure report has been included in a sealed envelope for you to review at your convenience later.  YOU SHOULD EXPECT: Some feelings of bloating in the abdomen. Passage of more gas than usual.  Walking can help get rid of the air that was put into your GI tract during the procedure and reduce the bloating. If you had a lower endoscopy (such as a colonoscopy or flexible sigmoidoscopy) you may notice spotting of blood in your stool or on the toilet paper. If you underwent a bowel prep for your procedure, then you may not have a normal bowel movement for a few days.  DIET: Your first meal following the procedure should be a light meal and then it is ok to progress to your normal diet.  A half-sandwich or bowl of soup is an example of a good first meal.  Heavy or fried foods are harder to digest and may make you feel nauseous or bloated.  Likewise meals heavy in dairy and vegetables can cause extra gas to form and this can also increase the bloating.  Drink plenty of fluids but you should avoid alcoholic beverages for 24 hours.  ACTIVITY: Your care partner should take you home directly after the procedure.  You should plan to take it easy, moving slowly for the rest of the day.  You can resume normal activity the day after the procedure however you should NOT DRIVE or use heavy machinery for 24 hours (because of the sedation medicines used during the test).    SYMPTOMS TO REPORT IMMEDIATELY: A gastroenterologist can be reached at any hour.  During normal business hours, 8:30 AM to 5:00 PM Monday through Friday,  call [phone].  After hours and on weekends, please call the GI answering service at [phone] who will take a message and have the physician on call contact you.  Following upper endoscopy (EGD)  Vomiting of blood or coffee ground material  New chest pain or pain under the shoulder blades  Painful or persistently difficult swallowing  New shortness of breath  Fever of 100F or higher  Black, tarry-looking stools  FOLLOW UP: If any biopsies were taken you will be contacted by phone or by letter within the next 1-3 weeks.  Call your gastroenterologist if you have not heard about the biopsies in 3 weeks.  Our staff will call the home number listed on your records the next business day following your procedure to check on you and address any questions or concerns that you may have at that time regarding the information given to you following your procedure. This is a courtesy call and so if there is no answer at the home number and we have not heard from you through the emergency physician on call, we will assume that you have returned to your regular daily activities without incident.  SIGNATURES/CONFIDENTIALITY: You and/or your care partner have signed paperwork which will be entered into your electronic medical record.  These signatures attest to the fact that that the information above on your After Visit Summary has been reviewed and is understood.  Full responsibility of   the confidentiality of this discharge information lies with you and/or your care-partner. 

## 2013-07-07 ENCOUNTER — Telehealth: Payer: Self-pay | Admitting: *Deleted

## 2013-07-07 NOTE — Telephone Encounter (Signed)
  Follow up Call-  Call back number 07/04/2013 05/15/2011  Post procedure Call Back phone  # [phone]  Permission to leave phone message Yes -     Patient questions:  Do you have a fever, pain , or abdominal swelling? no Pain Score  0 *  Have you tolerated food without any problems? yes  Have you been able to return to your normal activities? yes  Do you have any questions about your discharge instructions: Diet   no Medications  no Follow up visit  no  Do you have questions or concerns about your Care? no  Actions: * If pain score is 4 or above: No action needed, pain <4.

## 2013-07-15 ENCOUNTER — Other Ambulatory Visit: Payer: Self-pay | Admitting: *Deleted

## 2013-07-15 DIAGNOSIS — D508 Other iron deficiency anemias: Secondary | ICD-10-CM

## 2013-07-22 ENCOUNTER — Ambulatory Visit (INDEPENDENT_AMBULATORY_CARE_PROVIDER_SITE_OTHER): Payer: Medicare Other | Admitting: Gastroenterology

## 2013-07-22 DIAGNOSIS — R195 Other fecal abnormalities: Secondary | ICD-10-CM

## 2013-07-22 NOTE — Progress Notes (Signed)
Pt here this am and states she completed the prep as ordered with frequent BMs; she has been NPO since midnight. Pt swallowed the Pill about 8:01am with not distress noted or stated by the pt. She stayed her for 30 minutes lying on her left side and was discharged with instructions for activity, diet, and care for the equipment; pt stated understanding.   LOT  2014 - [id number]      25  Pt arrived back at 4:00pm and belt and module were removed. Pt denied any problems and was discharged with her noemal diet. She will call for problems.

## 2013-08-12 ENCOUNTER — Telehealth: Payer: Self-pay

## 2013-08-12 NOTE — Telephone Encounter (Signed)
Spoke with pt and let her know that the capsule had failed and that the charges have been reversed. Pt states she will call back and let us know if she wants to reschedule the capsule endo after she talks with her daughter.

## 2013-08-12 NOTE — Telephone Encounter (Signed)
Message copied by Chrystie Nose on Tue Aug 12, 2013  8:42 AM ------      Message from: Annett Fabian      Created: Fri Aug 08, 2013 11:33 AM       Bonita Quin, please reschedule the patient and explain that they have backed out her charges for the first capsule.      ----- Message -----         From: Louis Meckel, MD         Sent: 08/08/2013  11:28 AM           To: Rossie Muskrat, RN            yes      ----- Message -----         From: Rossie Muskrat, RN         Sent: 08/08/2013   8:41 AM           To: Louis Meckel, MD            The capsule on this patient failed.  We sent it to [facility] to see if they could get the info off of it and they also could not.  We were able to get a few images, but not able to make a video to view.  I am going to have Ina Kick back out her charges.  Do you want to repeat the exam if she is willing?             ------

## 2013-08-18 ENCOUNTER — Telehealth: Payer: Self-pay | Admitting: Nurse Practitioner

## 2013-08-18 NOTE — Telephone Encounter (Signed)
Samples up front 

## 2013-08-24 ENCOUNTER — Other Ambulatory Visit: Payer: Self-pay | Admitting: Nurse Practitioner

## 2013-09-05 ENCOUNTER — Encounter: Payer: Self-pay | Admitting: Nurse Practitioner

## 2013-09-05 ENCOUNTER — Ambulatory Visit (INDEPENDENT_AMBULATORY_CARE_PROVIDER_SITE_OTHER): Payer: Medicare Other | Admitting: Nurse Practitioner

## 2013-09-05 VITALS — BP 149/64 | HR 68 | Temp 99.6°F | Ht 65.5 in | Wt 167.0 lb

## 2013-09-05 DIAGNOSIS — I10 Essential (primary) hypertension: Secondary | ICD-10-CM

## 2013-09-05 DIAGNOSIS — E785 Hyperlipidemia, unspecified: Secondary | ICD-10-CM

## 2013-09-05 DIAGNOSIS — E119 Type 2 diabetes mellitus without complications: Secondary | ICD-10-CM

## 2013-09-05 NOTE — Patient Instructions (Addendum)

## 2013-09-05 NOTE — Addendum Note (Signed)
Addended by: Prescott Gum on: 09/05/2013 12:41 PM   Modules accepted: Orders

## 2013-09-05 NOTE — Progress Notes (Addendum)
Subjective:    Patient ID: Emily Wall, female    DOB: 11/17/1936, 77 y.o.   MRN: [id number]  Hypertension This is a chronic problem. The current episode started more than 1 year ago. The problem is unchanged. The problem is uncontrolled. Pertinent negatives include no blurred vision, chest pain, headaches, malaise/fatigue, orthopnea, palpitations, peripheral edema, shortness of breath or sweats. There are no associated agents to hypertension. Risk factors for coronary artery disease include diabetes mellitus, dyslipidemia, obesity, sedentary lifestyle and post-menopausal state. Past treatments include ACE inhibitors, beta blockers and diuretics. The current treatment provides moderate improvement. Compliance problems include diet and exercise.   Hyperlipidemia This is a chronic problem. The current episode started more than 1 year ago. The problem is controlled. Recent lipid tests were reviewed and are normal. She has no history of diabetes. Pertinent negatives include no chest pain, leg pain, myalgias or shortness of breath. Current antihyperlipidemic treatment includes statins. The current treatment provides significant improvement of lipids. There are no compliance problems.  Risk factors for coronary artery disease include diabetes mellitus, hypertension, post-menopausal and a sedentary lifestyle.  Diabetes She presents for her follow-up diabetic visit. She has type 2 diabetes mellitus. No MedicAlert identification noted. The initial diagnosis of diabetes was made 5 years ago. Her disease course has been stable. There are no hypoglycemic associated symptoms. Pertinent negatives for hypoglycemia include no headaches or sweats. Pertinent negatives for diabetes include no blurred vision, no chest pain, no polydipsia, no polyphagia, no polyuria, no visual change, no weakness and no weight loss. There are no hypoglycemic complications. Symptoms are stable. There are no diabetic complications. Risk  factors for coronary artery disease include dyslipidemia, hypertension, post-menopausal and sedentary lifestyle. Current diabetic treatment includes oral agent (dual therapy). She is compliant with treatment all of the time. Her weight is stable. She is following a generally healthy diet. When asked about meal planning, she reported none. She has not had a previous visit with a dietician. She rarely participates in exercise. Home blood sugar record trend: Patient doesn't shcek blood sugars at home. (Patient not checking blood sugars at home) An ACE inhibitor/angiotensin II receptor blocker is being taken. She does not see a podiatrist.Eye exam is not current (greate rthan 2 years- Patient instructed to make appointment.).  Peripheral edema Lasix daily- keeps swelling down. Anemia Ferrous sulfate daily  Review of Systems  Constitutional: Negative for weight loss and malaise/fatigue.  Eyes: Negative for blurred vision.  Respiratory: Negative for shortness of breath.   Cardiovascular: Negative for chest pain, palpitations and orthopnea.  Endocrine: Negative for polydipsia, polyphagia and polyuria.  Musculoskeletal: Negative for myalgias.  Neurological: Negative for weakness and headaches.  All other systems reviewed and are negative.       Objective:   Physical Exam  Constitutional: She is oriented to person, place, and time. She appears well-developed and well-nourished.  HENT:  Nose: Nose normal.  Mouth/Throat: Oropharynx is clear and moist.  Eyes: EOM are normal.  Neck: Trachea normal, normal range of motion and full passive range of motion without pain. Neck supple. No JVD present. Carotid bruit is not present. No thyromegaly present.  Cardiovascular: Normal rate, regular rhythm and intact distal pulses.  Exam reveals no gallop and no friction rub.   Murmur (3/6 systolic) heard. Pulmonary/Chest: Effort normal and breath sounds normal.  Abdominal: Soft. Bowel sounds are normal. She  exhibits no distension and no mass. There is no tenderness.  Musculoskeletal: Normal range of motion.  Lymphadenopathy:  She has no cervical adenopathy.  Neurological: She is alert and oriented to person, place, and time. She has normal reflexes.  Skin: Skin is warm and dry.  Psychiatric: She has a normal mood and affect. Her behavior is normal. Judgment and thought content normal.    BP 149/64  Pulse 68  Temp(Src) 99.6 F (37.6 C) (Oral)  Ht 5' 5.5" (1.664 m)  Wt 167 lb (75.751 kg)  BMI 27.36 kg/m2 Results for orders placed in visit on 09/05/13  POCT GLYCOSYLATED HEMOGLOBIN (HGB A1C)      Result Value Range   Hemoglobin A1C 6.7%    \     Assessment & Plan:   1. Diabetes   2. HTN (hypertension)   3. Other and unspecified hyperlipidemia    Orders Placed This Encounter  Procedures  . CMP14+EGFR  . NMR, lipoprofile  . POCT glycosylated hemoglobin (Hb A1C)     Medication List       This list is accurate as of: 09/05/13 11:43 AM.  Always use your most recent med list.               alendronate 70 MG tablet  Commonly known as:  FOSAMAX  Take 1 tablet (70 mg total) by mouth every 7 (seven) days. Take with a full glass of water on an empty stomach.     aspirin 325 MG tablet  Take 325 mg by mouth daily.     atorvastatin 20 MG tablet  Commonly known as:  LIPITOR  Take 1 tablet (20 mg total) by mouth daily.     CALCIUM + D PO  Take 1 tablet by mouth daily.     carvedilol 25 MG tablet  Commonly known as:  COREG  Take 1 tablet (25 mg total) by mouth 2 (two) times daily with a meal.     cholecalciferol 1000 UNITS tablet  Commonly known as:  VITAMIN D  Take 1,000 Units by mouth 2 (two) times daily.     ferrous sulfate 325 (65 FE) MG tablet  Take 1 tablet (325 mg total) by mouth daily with breakfast.     furosemide 20 MG tablet  Commonly known as:  LASIX  TAKE 1 TABLET BY MOUTH ONCE A DAY AS NEEDED     glucose blood test strip     LANCETS ULTRA THIN Misc      lisinopril 40 MG tablet  Commonly known as:  PRINIVIL,ZESTRIL  Take 1 tablet (40 mg total) by mouth daily.     metFORMIN 1000 MG tablet  Commonly known as:  GLUCOPHAGE  Take 1 tablet (1,000 mg total) by mouth 2 (two) times daily with a meal.     sitaGLIPtin 100 MG tablet  Commonly known as:  JANUVIA  Take 100 mg by mouth daily.     triamcinolone cream 0.1 %  Commonly known as:  KENALOG  Apply 1 application topically daily.         Continue all meds  Labs pending Diet and exercise encouraged Follow- up in 3 months  Mary-Margaret Daphine Deutscher, FNP

## 2013-09-07 LAB — CMP14+EGFR
Albumin: 3.8 g/dL (ref 3.5–4.8)
BUN: 7 mg/dL — ABNORMAL LOW (ref 8–27)
CO2: 26 mmol/L (ref 18–29)
Calcium: 9.1 mg/dL (ref 8.6–10.2)
Chloride: 100 mmol/L (ref 97–108)
Glucose: 152 mg/dL — ABNORMAL HIGH (ref 65–99)
Total Protein: 6.6 g/dL (ref 6.0–8.5)

## 2013-09-07 LAB — NMR, LIPOPROFILE
Cholesterol: 127 mg/dL (ref <200)
HDL Cholesterol by NMR: 55 mg/dL (ref >=40)
LDL Particle Number: 804 nmol/L (ref <1000)
LDLC SERPL CALC-MCNC: 56 mg/dL (ref <100)
LP-IR Score: 32 (ref <=45)
Triglycerides by NMR: 79 mg/dL (ref <150)

## 2013-11-03 ENCOUNTER — Telehealth: Payer: Self-pay | Admitting: Nurse Practitioner

## 2013-11-04 NOTE — Telephone Encounter (Signed)
Up front 

## 2013-11-05 ENCOUNTER — Other Ambulatory Visit: Payer: Self-pay

## 2013-11-05 DIAGNOSIS — D649 Anemia, unspecified: Secondary | ICD-10-CM

## 2013-11-06 ENCOUNTER — Telehealth: Payer: Self-pay

## 2013-11-06 NOTE — Telephone Encounter (Signed)
Message copied by Chrystie Nose on Thu Nov 06, 2013 10:27 AM ------      Message from: Santel, Virginia S      Created: Thu Nov 06, 2013 10:21 AM      Regarding: RE: Capsule       And you need to  Review the capsule  :)          ----- Message -----         From: Louis Meckel, MD         Sent: 11/05/2013   3:56 PM           To: Amy Oswald Hillock, PA-C, Lily Lovings, RN      Subject: FW: Capsule                                              Patient needs a CBC and return office visit.       ----- Message -----         From: Sammuel Cooper, PA-C         Sent: 11/05/2013   3:16 PM           To: Louis Meckel, MD      Subject: Capsule                                                  Capsule for you to review--- this was actually done in July and would not download- we sent it to [facility] and we finally have a disc we can read-   Study is negative, but you may want to see pt back- no charge as study failed             ------

## 2013-11-06 NOTE — Telephone Encounter (Signed)
Pt already has OV scheduled for 11/12/13.

## 2013-11-12 ENCOUNTER — Encounter: Payer: Self-pay | Admitting: Gastroenterology

## 2013-11-12 ENCOUNTER — Ambulatory Visit (INDEPENDENT_AMBULATORY_CARE_PROVIDER_SITE_OTHER): Payer: Medicare Other | Admitting: Gastroenterology

## 2013-11-12 VITALS — BP 144/80 | HR 60 | Ht 65.5 in | Wt 166.0 lb

## 2013-11-12 DIAGNOSIS — D649 Anemia, unspecified: Secondary | ICD-10-CM

## 2013-11-12 DIAGNOSIS — D509 Iron deficiency anemia, unspecified: Secondary | ICD-10-CM

## 2013-11-12 NOTE — Patient Instructions (Signed)

## 2013-11-12 NOTE — Progress Notes (Signed)
History of Present Illness:  The patient has returned for followup of heme positive stool.  Workup included upper endoscopy and enteroscopy and capsule endoscopy.  Exams were normal.  2012 colonoscopy demonstrated diverticulosis.  The patient has no GI complaints including abdominal pain, change in bowel habits, melena or hematochezia.  She takes supplementary iron.  Hemoglobin in June, 2014 was 11.7.  Family history is pertinent for mother and brother with colon cancer.    Review of Systems: Pertinent positive and negative review of systems were noted in the above HPI section. All other review of systems were otherwise negative.    Current Medications, Allergies, Past Medical History, Past Surgical History, Family History and Social History were reviewed in [employer] electronic medical record  Vital signs were reviewed in today's medical record. Physical Exam: General: Well developed , well nourished, no acute distress Abdomen is without masses, tenderness or organomegaly

## 2013-11-12 NOTE — Assessment & Plan Note (Signed)
No source for Hemoccult-positive stool has been identified.  Clinically suspect that she has AVMs although they have not been identified.  Colonoscopy in 2012 demonstrated diverticulosis only.  Because of persistence of Hemoccult-positive stool and family history of colon cancer I will proceed with repeat colonoscopy

## 2013-11-29 ENCOUNTER — Other Ambulatory Visit: Payer: Self-pay | Admitting: Family Medicine

## 2013-12-10 ENCOUNTER — Other Ambulatory Visit: Payer: Self-pay

## 2013-12-10 MED ORDER — SITAGLIPTIN PHOSPHATE 100 MG PO TABS: 100.0000 mg | ORAL_TABLET | Freq: Every day | ORAL | Status: DC

## 2013-12-31 ENCOUNTER — Other Ambulatory Visit: Payer: Self-pay | Admitting: Nurse Practitioner

## 2014-01-01 ENCOUNTER — Telehealth: Payer: Self-pay | Admitting: Gastroenterology

## 2014-01-01 NOTE — Telephone Encounter (Signed)
Date rescheduled, free prep outfront for patient and new instructions for patient

## 2014-01-02 ENCOUNTER — Encounter: Payer: Medicare Other | Admitting: Gastroenterology

## 2014-01-20 ENCOUNTER — Telehealth: Payer: Self-pay | Admitting: Cardiology

## 2014-01-20 NOTE — Telephone Encounter (Signed)
ECHO NEEDS TO BE SCHEDULED. Called and left message asking Mrs. Kulig to contact our Office.

## 2014-01-24 ENCOUNTER — Other Ambulatory Visit: Payer: Self-pay | Admitting: Nurse Practitioner

## 2014-01-26 ENCOUNTER — Telehealth: Payer: Self-pay | Admitting: Nurse Practitioner

## 2014-01-26 NOTE — Telephone Encounter (Signed)
Samples up front 

## 2014-01-27 NOTE — Telephone Encounter (Signed)
Last seen 09/05/13  MMM  Last glucose 9/14

## 2014-01-27 NOTE — Telephone Encounter (Signed)
ntbs

## 2014-01-28 ENCOUNTER — Encounter: Payer: Self-pay | Admitting: Cardiology

## 2014-02-02 ENCOUNTER — Other Ambulatory Visit: Payer: Self-pay | Admitting: Nurse Practitioner

## 2014-02-05 ENCOUNTER — Other Ambulatory Visit (INDEPENDENT_AMBULATORY_CARE_PROVIDER_SITE_OTHER): Payer: Medicare HMO

## 2014-02-05 ENCOUNTER — Other Ambulatory Visit: Payer: Self-pay

## 2014-02-05 DIAGNOSIS — I359 Nonrheumatic aortic valve disorder, unspecified: Secondary | ICD-10-CM

## 2014-02-05 DIAGNOSIS — I429 Cardiomyopathy, unspecified: Secondary | ICD-10-CM

## 2014-02-05 DIAGNOSIS — I350 Nonrheumatic aortic (valve) stenosis: Secondary | ICD-10-CM

## 2014-02-05 DIAGNOSIS — I059 Rheumatic mitral valve disease, unspecified: Secondary | ICD-10-CM

## 2014-02-06 ENCOUNTER — Telehealth: Payer: Self-pay | Admitting: *Deleted

## 2014-02-06 NOTE — Telephone Encounter (Signed)
Patient informed. 

## 2014-02-06 NOTE — Telephone Encounter (Signed)
Message copied by Merlene Laughter on Fri Feb 06, 2014  1:22 PM ------      Message from: MCDOWELL, Aloha Gell      Created: Thu Feb 05, 2014  1:50 PM       Reviewed report. Stable overall moderate aortic stenosis. ------

## 2014-02-19 ENCOUNTER — Other Ambulatory Visit: Payer: Self-pay | Admitting: *Deleted

## 2014-02-19 DIAGNOSIS — E119 Type 2 diabetes mellitus without complications: Secondary | ICD-10-CM

## 2014-02-20 MED ORDER — METFORMIN HCL 1000 MG PO TABS: 1000.0000 mg | ORAL_TABLET | Freq: Two times a day (BID) | ORAL | Status: DC

## 2014-02-20 NOTE — Telephone Encounter (Signed)
ntbs

## 2014-02-23 ENCOUNTER — Encounter: Payer: Self-pay | Admitting: Gastroenterology

## 2014-02-23 ENCOUNTER — Ambulatory Visit (AMBULATORY_SURGERY_CENTER): Payer: Medicare HMO | Admitting: Gastroenterology

## 2014-02-23 VITALS — BP 133/65 | HR 67 | Temp 97.8°F | Resp 16 | Ht 65.0 in | Wt 166.0 lb

## 2014-02-23 DIAGNOSIS — K5730 Diverticulosis of large intestine without perforation or abscess without bleeding: Secondary | ICD-10-CM

## 2014-02-23 DIAGNOSIS — D649 Anemia, unspecified: Secondary | ICD-10-CM

## 2014-02-23 DIAGNOSIS — K623 Rectal prolapse: Secondary | ICD-10-CM

## 2014-02-23 DIAGNOSIS — R195 Other fecal abnormalities: Secondary | ICD-10-CM

## 2014-02-23 DIAGNOSIS — K5289 Other specified noninfective gastroenteritis and colitis: Secondary | ICD-10-CM

## 2014-02-23 DIAGNOSIS — Z800 Family history of malignant neoplasm of digestive organs: Secondary | ICD-10-CM

## 2014-02-23 LAB — GLUCOSE, CAPILLARY
GLUCOSE-CAPILLARY: 190 mg/dL — AB (ref 70–99)
Glucose-Capillary: 139 mg/dL — ABNORMAL HIGH (ref 70–99)

## 2014-02-23 MED ORDER — SODIUM CHLORIDE 0.9 % IV SOLN: 500.0000 mL | INTRAVENOUS | Status: DC

## 2014-02-23 NOTE — Op Note (Signed)
[place]  [address] [place], [id number]   COLONOSCOPY PROCEDURE REPORT  PATIENT: Emily Wall, Emily Wall  MR#: [id number] BIRTHDATE: 10-05-1936 , 66  yrs. old GENDER: Female ENDOSCOPIST: Inda Castle, MD REFERRED XB:JYNWGN Laurance Flatten, M.D. PROCEDURE DATE:  02/23/2014 PROCEDURE:   Colonoscopy with biopsy First Screening Colonoscopy - Avg.  risk and is 50 yrs.  old or older - No.  Prior Negative Screening - Now for repeat screening. Other: See Comments  History of Adenoma - Now for follow-up colonoscopy & has been > or = to 3 yrs.  N/A  Polyps Removed Today? No.  Recommend repeat exam, <10 yrs? Yes.  High risk (family or personal hx). ASA CLASS:   Class II INDICATIONS:Iron Deficiency Anemia, Patient's immediate family history of colon cancer, and heme-positive stool. MEDICATIONS: MAC sedation, administered by CRNA and Propofol (Diprivan) 240 mg IV  DESCRIPTION OF PROCEDURE:   After the risks benefits and alternatives of the procedure were thoroughly explained, informed consent was obtained.  A digital rectal exam revealed no abnormalities of the rectum.   The LB [id number]  endoscope was introduced through the anus and advanced to the cecum, which was identified by both the appendix and ileocecal valve. No adverse events experienced.   The quality of the prep was Suprep good  The instrument was then slowly withdrawn as the colon was fully examined.      COLON FINDINGS: Moderate diverticulosis was noted at the cecum and in the ascending colon.   In the rectum just beyond the anal canal there were enlarged, edematous and slightly friable folds. Biopsies were taken to rule out adenomatous changes.   In the rectum just beyond the anal canal there were enlarged, edematous and slightly friable folds.  Biopsies were taken to rule out adenomatous changes.   The colon was otherwise normal.  There was no diverticulosis, inflammation, polyps or cancers  unless previously stated.  Retroflexed views revealed no abnormalities. The time to cecum=4 minutes 44 seconds.  Withdrawal time=12 minutes .8 seconds.  The scope was withdrawn and the procedure completed. COMPLICATIONS: There were no complications.  ENDOSCOPIC IMPRESSION: 1.   Moderate diverticulosis was noted at the cecum and in the ascending colon 2.  inflammatory changes in the rectum-rule out adenomatous changes  Findings do not explain iron deficiency anemia (mild friability in rectum could account for Hemoccult-positive stool)   RECOMMENDATIONS: capsule endoscopy followup colonoscopy 5 years (pending overall medical status)   eSigned:  Inda Castle, MD 02/23/2014 11:11 AM   cc:   PATIENT NAME:  Emily Wall MR#: [id number]

## 2014-02-23 NOTE — Progress Notes (Signed)
Report to pacu rn, vss, bbs=clear 

## 2014-02-23 NOTE — Progress Notes (Signed)
Called to room to assist during endoscopic procedure.  Patient ID and intended procedure confirmed with present staff. Received instructions for my participation in the procedure from the performing physician.  

## 2014-02-23 NOTE — Patient Instructions (Signed)
Impressions/recommendations:  Diverticulosis (handout given) High Fiber diet (handout given)  Will schedule for capsule endoscopy.  YOU HAD AN ENDOSCOPIC PROCEDURE TODAY AT [facility]: Refer to the procedure report that was given to you for any specific questions about what was found during the examination.  If the procedure report does not answer your questions, please call your gastroenterologist to clarify.  If you requested that your care partner not be given the details of your procedure findings, then the procedure report has been included in a sealed envelope for you to review at your convenience later.  YOU SHOULD EXPECT: Some feelings of bloating in the abdomen. Passage of more gas than usual.  Walking can help get rid of the air that was put into your GI tract during the procedure and reduce the bloating. If you had a lower endoscopy (such as a colonoscopy or flexible sigmoidoscopy) you may notice spotting of blood in your stool or on the toilet paper. If you underwent a bowel prep for your procedure, then you may not have a normal bowel movement for a few days.  DIET: Your first meal following the procedure should be a light meal and then it is ok to progress to your normal diet.  A half-sandwich or bowl of soup is an example of a good first meal.  Heavy or fried foods are harder to digest and may make you feel nauseous or bloated.  Likewise meals heavy in dairy and vegetables can cause extra gas to form and this can also increase the bloating.  Drink plenty of fluids but you should avoid alcoholic beverages for 24 hours.  ACTIVITY: Your care partner should take you home directly after the procedure.  You should plan to take it easy, moving slowly for the rest of the day.  You can resume normal activity the day after the procedure however you should NOT DRIVE or use heavy machinery for 24 hours (because of the sedation medicines used during the test).    SYMPTOMS TO REPORT  IMMEDIATELY: A gastroenterologist can be reached at any hour.  During normal business hours, 8:30 AM to 5:00 PM Monday through Friday, call [phone].  After hours and on weekends, please call the GI answering service at [phone] who will take a message and have the physician on call contact you.   Following lower endoscopy (colonoscopy or flexible sigmoidoscopy):  Excessive amounts of blood in the stool  Significant tenderness or worsening of abdominal pains  Swelling of the abdomen that is new, acute  Fever of 100F or higher  FOLLOW UP: If any biopsies were taken you will be contacted by phone or by letter within the next 1-3 weeks.  Call your gastroenterologist if you have not heard about the biopsies in 3 weeks.  Our staff will call the home number listed on your records the next business day following your procedure to check on you and address any questions or concerns that you may have at that time regarding the information given to you following your procedure. This is a courtesy call and so if there is no answer at the home number and we have not heard from you through the emergency physician on call, we will assume that you have returned to your regular daily activities without incident.  SIGNATURES/CONFIDENTIALITY: You and/or your care partner have signed paperwork which will be entered into your electronic medical record.  These signatures attest to the fact that that the information above on your  After Visit Summary has been reviewed and is understood.  Full responsibility of the confidentiality of this discharge information lies with you and/or your care-partner.

## 2014-02-24 ENCOUNTER — Telehealth: Payer: Self-pay | Admitting: *Deleted

## 2014-02-24 NOTE — Telephone Encounter (Signed)
  Follow up Call-  Call back number 02/23/2014 07/04/2013  Post procedure Call Back phone  # [phone]  Permission to leave phone message No Yes     Patient questions:  Do you have a fever, pain , or abdominal swelling? no Pain Score  0 *  Have you tolerated food without any problems? yes  Have you been able to return to your normal activities? yes  Do you have any questions about your discharge instructions: Diet   no Medications  no Follow up visit  no  Do you have questions or concerns about your Care? no  Actions: * If pain score is 4 or above: No action needed, pain <4.

## 2014-02-26 ENCOUNTER — Other Ambulatory Visit: Payer: Self-pay | Admitting: Nurse Practitioner

## 2014-03-02 ENCOUNTER — Encounter: Payer: Self-pay | Admitting: Gastroenterology

## 2014-03-05 ENCOUNTER — Other Ambulatory Visit: Payer: Self-pay | Admitting: Nurse Practitioner

## 2014-03-23 ENCOUNTER — Telehealth: Payer: Self-pay | Admitting: Nurse Practitioner

## 2014-03-23 MED ORDER — SITAGLIPTIN PHOSPHATE 100 MG PO TABS: 100.0000 mg | ORAL_TABLET | Freq: Every day | ORAL | Status: DC

## 2014-03-23 NOTE — Telephone Encounter (Signed)
Patient aware that samples are available for pickup at front desk.

## 2014-03-30 ENCOUNTER — Encounter: Payer: Self-pay | Admitting: Cardiology

## 2014-03-30 ENCOUNTER — Ambulatory Visit (INDEPENDENT_AMBULATORY_CARE_PROVIDER_SITE_OTHER): Payer: Commercial Managed Care - HMO | Admitting: Cardiology

## 2014-03-30 VITALS — BP 131/71 | HR 85 | Ht 66.0 in | Wt 170.0 lb

## 2014-03-30 DIAGNOSIS — E785 Hyperlipidemia, unspecified: Secondary | ICD-10-CM

## 2014-03-30 DIAGNOSIS — I359 Nonrheumatic aortic valve disorder, unspecified: Secondary | ICD-10-CM

## 2014-03-30 DIAGNOSIS — I429 Cardiomyopathy, unspecified: Secondary | ICD-10-CM

## 2014-03-30 DIAGNOSIS — I10 Essential (primary) hypertension: Secondary | ICD-10-CM | POA: Diagnosis not present

## 2014-03-30 NOTE — Assessment & Plan Note (Signed)
She continues on Lipitor, LDL 56.

## 2014-03-30 NOTE — Patient Instructions (Signed)
Continue all current medications. Your physician wants you to follow up in:  1 year.  You will receive a reminder letter in the mail one-two months in advance.  If you don't receive a letter, please call our office to schedule the follow up appointment   

## 2014-03-30 NOTE — Assessment & Plan Note (Signed)
Nonischemic, LVEF 50-55% on medical therapy.

## 2014-03-30 NOTE — Assessment & Plan Note (Signed)
No change to antihypertensive regimen.

## 2014-03-30 NOTE — Progress Notes (Signed)
Clinical Summary Emily Wall is a 78 y.o.female last seen in April 2014. She reports no change in functional capacity, NYHA class II dyspnea, no chest pain. No palpitations or syncope.  Echocardiogram from February of this year demonstrated moderate LVH with LVEF 65-78%, grade 1 diastolic dysfunction, calcified aortic annulus with moderate stenosis and moderate regurgitation, mean gradient 23 mm mercury, mild mitral regurgitation, moderate left atrial enlargement, mild tricuspid regurgitation, normal RV-RA gradient. We discussed the results today.  Lipids are followed by Dr. Laurance Flatten. Lab work from September 2014 showed cholesterol 127, triglycerides 79, HDL 55, LDL 56.  ECG today shows sinus rhythm with PVCs.  No Known Allergies  Current Outpatient Prescriptions  Medication Sig Dispense Refill  . alendronate (FOSAMAX) 70 MG tablet TAKE 1 TABLET (70 MG TOTAL) BY MOUTH EVERY 7 (SEVEN) DAYS. TAKE WITH A FULL GLASS OF WATER ON AN EMPTY  4 tablet  0  . aspirin 325 MG tablet Take 325 mg by mouth daily.        Marland Kitchen atorvastatin (LIPITOR) 20 MG tablet Take 1 tablet (20 mg total) by mouth daily.  30 tablet  5  . Calcium Carbonate-Vitamin D (CALCIUM + D PO) Take 1 tablet by mouth daily.        . carvedilol (COREG) 25 MG tablet TAKE ONE TABLET BY MOUTH TWICE A DAY WITH MEALS  60 tablet  0  . cholecalciferol (VITAMIN D) 1000 UNITS tablet Take 1,000 Units by mouth 2 (two) times daily.      . ferrous sulfate 325 (65 FE) MG tablet Take 1 tablet (325 mg total) by mouth daily with breakfast.  30 tablet  5  . furosemide (LASIX) 20 MG tablet TAKE ONE TABLET BY MOUTH DAILY AS NEEDED  30 tablet  0  . glucose blood test strip       . LANCETS ULTRA THIN MISC       . lisinopril (PRINIVIL,ZESTRIL) 40 MG tablet TAKE ONE TABLET BY MOUTH ONE TIME DAILY  30 tablet  0  . metFORMIN (GLUCOPHAGE) 1000 MG tablet Take 1 tablet (1,000 mg total) by mouth 2 (two) times daily with a meal.  60 tablet  5  . sitaGLIPtin (JANUVIA)  100 MG tablet Take 1 tablet (100 mg total) by mouth daily.  28 tablet  0  . triamcinolone cream (KENALOG) 0.1 % Apply 1 application topically daily.        No current facility-administered medications for this visit.    Past Medical History  Diagnosis Date  . Aortic stenosis     Mild to moderate  . Aortic regurgitation     Mild  . Nonischemic cardiomyopathy     LVEF improved to 50-55%  . Type 2 diabetes mellitus   . Coronary atherosclerosis of native coronary artery     Nonobstructive 2007  . Essential hypertension, benign   . Osteoporosis   . Hypothyroidism     Social History Emily Wall reports that she quit smoking about 25 years ago. Her smoking use included Cigarettes. She has a 15 pack-year smoking history. She has never used smokeless tobacco. Emily Wall reports that she does not drink alcohol.  Review of Systems Negative except as outlined above.  Physical Examination Filed Vitals:   03/30/14 0904  BP: 131/71  Pulse: 85   Filed Weights   03/30/14 0904  Weight: 170 lb (77.111 kg)    Overweight woman in no acute distress.  HEENT: Conjunctiva and lids normal, oropharynx with moist mucosa.  Neck: Supple, no elevated JVP or bruits.  Lungs: Clear to auscultation, nonlabored.  Cardiac: Regular rate and rhythm, 3/6 systolic murmur heard at the base, second heart sound preserved, no S3 gallop or pericardial rub, no loud diastolic murmur.  Abdomen: Soft, nontender, bowel sounds present.  Extremities: No pitting edema, distal pulses full.    Problem List and Plan   AORTIC STENOSIS Overall moderate aortic stenosis and regurgitation, mean gradient 23 mm mercury. Asymptomatic at this time. Murmur is stable on examination. Followup in one year.  CARDIOMYOPATHY, SECONDARY Nonischemic, LVEF 50-55% on medical therapy.  HYPERTENSION, UNSPECIFIED No change to antihypertensive regimen.  Hyperlipidemia She continues on Lipitor, LDL 56.    Satira Sark, M.D.,  F.A.C.C.

## 2014-03-30 NOTE — Assessment & Plan Note (Signed)
Overall moderate aortic stenosis and regurgitation, mean gradient 23 mm mercury. Asymptomatic at this time. Murmur is stable on examination. Followup in one year.

## 2014-03-31 ENCOUNTER — Other Ambulatory Visit: Payer: Self-pay | Admitting: Nurse Practitioner

## 2014-04-01 NOTE — Telephone Encounter (Signed)
Last ov 9/14 

## 2014-04-01 NOTE — Telephone Encounter (Signed)
Patient NTBS for follow up and lab work  

## 2014-04-03 ENCOUNTER — Other Ambulatory Visit: Payer: Self-pay | Admitting: Nurse Practitioner

## 2014-04-05 ENCOUNTER — Other Ambulatory Visit: Payer: Self-pay | Admitting: Nurse Practitioner

## 2014-04-06 NOTE — Telephone Encounter (Signed)
Last seen 08/2013 

## 2014-04-06 NOTE — Telephone Encounter (Signed)
Patient NTBS for follow up and lab work  

## 2014-04-13 ENCOUNTER — Other Ambulatory Visit: Payer: Self-pay | Admitting: Nurse Practitioner

## 2014-04-14 NOTE — Telephone Encounter (Signed)
Patient NTBS for follow up and lab work  

## 2014-04-14 NOTE — Telephone Encounter (Signed)
Last seen 09/05/13  MMM 

## 2014-05-07 ENCOUNTER — Telehealth: Payer: Self-pay | Admitting: Nurse Practitioner

## 2014-05-09 ENCOUNTER — Other Ambulatory Visit: Payer: Self-pay | Admitting: Nurse Practitioner

## 2014-05-11 NOTE — Telephone Encounter (Signed)
Last seen 09/05/13  MMM 

## 2014-05-11 NOTE — Telephone Encounter (Signed)
No more refills without being seen 

## 2014-05-17 ENCOUNTER — Other Ambulatory Visit: Payer: Self-pay | Admitting: Nurse Practitioner

## 2014-05-19 NOTE — Telephone Encounter (Signed)
Patient NTBS for follow up and lab work p

## 2014-05-19 NOTE — Telephone Encounter (Signed)
Last seen 08/2013 

## 2014-05-20 NOTE — Telephone Encounter (Signed)
Patient aware.

## 2014-05-22 ENCOUNTER — Telehealth: Payer: Self-pay | Admitting: Nurse Practitioner

## 2014-05-22 NOTE — Telephone Encounter (Signed)
Patient aware to pick up 

## 2014-06-01 ENCOUNTER — Other Ambulatory Visit: Payer: Self-pay | Admitting: Nurse Practitioner

## 2014-06-02 NOTE — Telephone Encounter (Signed)
Last seen in office on 09-05-13. Please advise on refill

## 2014-06-03 ENCOUNTER — Other Ambulatory Visit: Payer: Self-pay | Admitting: Nurse Practitioner

## 2014-06-04 NOTE — Telephone Encounter (Signed)
Last seen 09/05/13  MMM

## 2014-06-05 NOTE — Telephone Encounter (Signed)
Patient NTBS for follow up and lab work No more refills until seen.

## 2014-06-09 ENCOUNTER — Other Ambulatory Visit: Payer: Self-pay | Admitting: Nurse Practitioner

## 2014-06-22 ENCOUNTER — Encounter: Payer: Self-pay | Admitting: Nurse Practitioner

## 2014-06-22 ENCOUNTER — Ambulatory Visit (INDEPENDENT_AMBULATORY_CARE_PROVIDER_SITE_OTHER): Payer: Commercial Managed Care - HMO | Admitting: Nurse Practitioner

## 2014-06-22 VITALS — BP 118/62 | HR 81 | Temp 97.9°F | Ht 66.0 in | Wt 170.4 lb

## 2014-06-22 DIAGNOSIS — I429 Cardiomyopathy, unspecified: Secondary | ICD-10-CM

## 2014-06-22 DIAGNOSIS — D509 Iron deficiency anemia, unspecified: Secondary | ICD-10-CM

## 2014-06-22 DIAGNOSIS — E785 Hyperlipidemia, unspecified: Secondary | ICD-10-CM

## 2014-06-22 DIAGNOSIS — M810 Age-related osteoporosis without current pathological fracture: Secondary | ICD-10-CM

## 2014-06-22 DIAGNOSIS — I10 Essential (primary) hypertension: Secondary | ICD-10-CM

## 2014-06-22 DIAGNOSIS — R0989 Other specified symptoms and signs involving the circulatory and respiratory systems: Secondary | ICD-10-CM

## 2014-06-22 DIAGNOSIS — E119 Type 2 diabetes mellitus without complications: Secondary | ICD-10-CM

## 2014-06-22 LAB — POCT GLYCOSYLATED HEMOGLOBIN (HGB A1C)

## 2014-06-22 NOTE — Patient Instructions (Signed)
Health Maintenance, Female A healthy lifestyle and preventative care can promote health and wellness.  Maintain regular health, dental, and eye exams.  Eat a healthy diet. Foods like vegetables, fruits, whole grains, low-fat dairy products, and lean protein foods contain the nutrients you need without too many calories. Decrease your intake of foods high in solid fats, added sugars, and salt. Get information about a proper diet from your caregiver, if necessary.  Regular physical exercise is one of the most important things you can do for your health. Most adults should get at least 150 minutes of moderate-intensity exercise (any activity that increases your heart rate and causes you to sweat) each week. In addition, most adults need muscle-strengthening exercises on 2 or more days a week.   Maintain a healthy weight. The body mass index (BMI) is a screening tool to identify possible weight problems. It provides an estimate of body fat based on height and weight. Your caregiver can help determine your BMI, and can help you achieve or maintain a healthy weight. For adults 20 years and older:  A BMI below 18.5 is considered underweight.  A BMI of 18.5 to 24.9 is normal.  A BMI of 25 to 29.9 is considered overweight.  A BMI of 30 and above is considered obese.  Maintain normal blood lipids and cholesterol by exercising and minimizing your intake of saturated fat. Eat a balanced diet with plenty of fruits and vegetables. Blood tests for lipids and cholesterol should begin at age 75 and be repeated every 5 years. If your lipid or cholesterol levels are high, you are over 50, or you are a high risk for heart disease, you may need your cholesterol levels checked more frequently.Ongoing high lipid and cholesterol levels should be treated with medicines if diet and exercise are not effective.  If you smoke, find out from your caregiver how to quit. If you do not use tobacco, do not start.  Lung  cancer screening is recommended for adults aged 38-80 years who are at high risk for developing lung cancer because of a history of smoking. Yearly low-dose computed tomography (CT) is recommended for people who have at least a 30-pack-year history of smoking and are a current smoker or have quit within the past 15 years. A pack year of smoking is smoking an average of 1 pack of cigarettes a day for 1 year (for example: 1 pack a day for 30 years or 2 packs a day for 15 years). Yearly screening should continue until the smoker has stopped smoking for at least 15 years. Yearly screening should also be stopped for people who develop a health problem that would prevent them from having lung cancer treatment.  If you are pregnant, do not drink alcohol. If you are breastfeeding, be very cautious about drinking alcohol. If you are not pregnant and choose to drink alcohol, do not exceed 1 drink per day. One drink is considered to be 12 ounces (355 mL) of beer, 5 ounces (148 mL) of wine, or 1.5 ounces (44 mL) of liquor.  Avoid use of street drugs. Do not share needles with anyone. Ask for help if you need support or instructions about stopping the use of drugs.  High blood pressure causes heart disease and increases the risk of stroke. Blood pressure should be checked at least every 1 to 2 years. Ongoing high blood pressure should be treated with medicines, if weight loss and exercise are not effective.  If you are 55 to 76  years old, ask your caregiver if you should take aspirin to prevent strokes.  Diabetes screening involves taking a blood sample to check your fasting blood sugar level. This should be done once every 3 years, after age 69, if you are within normal weight and without risk factors for diabetes. Testing should be considered at a younger age or be carried out more frequently if you are overweight and have at least 1 risk factor for diabetes.  Breast cancer screening is essential preventative care  for women. You should practice "breast self-awareness." This means understanding the normal appearance and feel of your breasts and may include breast self-examination. Any changes detected, no matter how small, should be reported to a caregiver. Women in their 69s and 30s should have a clinical breast exam (CBE) by a caregiver as part of a regular health exam every 1 to 3 years. After age 56, women should have a CBE every year. Starting at age 6, women should consider having a mammogram (breast X-ray) every year. Women who have a family history of breast cancer should talk to their caregiver about genetic screening. Women at a high risk of breast cancer should talk to their caregiver about having an MRI and a mammogram every year.  Breast cancer gene (BRCA)-related cancer risk assessment is recommended for women who have family members with BRCA-related cancers. BRCA-related cancers include breast, ovarian, tubal, and peritoneal cancers. Having family members with these cancers may be associated with an increased risk for harmful changes (mutations) in the breast cancer genes BRCA1 and BRCA2. Results of the assessment will determine the need for genetic counseling and BRCA1 and BRCA2 testing.  The Pap test is a screening test for cervical cancer. Women should have a Pap test starting at age 25. Between ages 61 and 82, Pap tests should be repeated every 2 years. Beginning at age 62, you should have a Pap test every 3 years as long as the past 3 Pap tests have been normal. If you had a hysterectomy for a problem that was not cancer or a condition that could lead to cancer, then you no longer need Pap tests. If you are between ages 28 and 2, and you have had normal Pap tests going back 10 years, you no longer need Pap tests. If you have had past treatment for cervical cancer or a condition that could lead to cancer, you need Pap tests and screening for cancer for at least 20 years after your treatment. If Pap  tests have been discontinued, risk factors (such as a new sexual partner) need to be reassessed to determine if screening should be resumed. Some women have medical problems that increase the chance of getting cervical cancer. In these cases, your caregiver may recommend more frequent screening and Pap tests.  The human papillomavirus (HPV) test is an additional test that may be used for cervical cancer screening. The HPV test looks for the virus that can cause the cell changes on the cervix. The cells collected during the Pap test can be tested for HPV. The HPV test could be used to screen women aged 43 years and older, and should be used in women of any age who have unclear Pap test results. After the age of 49, women should have HPV testing at the same frequency as a Pap test.  Colorectal cancer can be detected and often prevented. Most routine colorectal cancer screening begins at the age of 52 and continues through age 86. However, your caregiver may  recommend screening at an earlier age if you have risk factors for colon cancer. On a yearly basis, your caregiver may provide home test kits to check for hidden blood in the stool. Use of a small camera at the end of a tube, to directly examine the colon (sigmoidoscopy or colonoscopy), can detect the earliest forms of colorectal cancer. Talk to your caregiver about this at age 61, when routine screening begins. Direct examination of the colon should be repeated every 5 to 10 years through age 16, unless early forms of pre-cancerous polyps or small growths are found.  Hepatitis C blood testing is recommended for all people born from [birth year] through 1963 and any individual with known risks for hepatitis C.  Practice safe sex. Use condoms and avoid high-risk sexual practices to reduce the spread of sexually transmitted infections (STIs). Sexually active women aged 68 and younger should be checked for Chlamydia, which is a common sexually transmitted infection.  Older women with new or multiple partners should also be tested for Chlamydia. Testing for other STIs is recommended if you are sexually active and at increased risk.  Osteoporosis is a disease in which the bones lose minerals and strength with aging. This can result in serious bone fractures. The risk of osteoporosis can be identified using a bone density scan. Women ages 60 and over and women at risk for fractures or osteoporosis should discuss screening with their caregivers. Ask your caregiver whether you should be taking a calcium supplement or vitamin D to reduce the rate of osteoporosis.  Menopause can be associated with physical symptoms and risks. Hormone replacement therapy is available to decrease symptoms and risks. You should talk to your caregiver about whether hormone replacement therapy is right for you.  Use sunscreen. Apply sunscreen liberally and repeatedly throughout the day. You should seek shade when your shadow is shorter than you. Protect yourself by wearing long sleeves, pants, a wide-brimmed hat, and sunglasses year round, whenever you are outdoors.  Notify your caregiver of new moles or changes in moles, especially if there is a change in shape or color. Also notify your caregiver if a mole is larger than the size of a pencil eraser.  Stay current with your immunizations. Document Released: 06/26/2011 Document Revised: 04/07/2013 Document Reviewed: 11/12/2013 [facility] Patient Information 2015 [place], [place]. This information is not intended to replace advice given to you by your health care provider. Make sure you discuss any questions you have with your health care provider.

## 2014-06-22 NOTE — Addendum Note (Signed)
Addended by: Chevis Pretty on: 06/22/2014 04:26 PM   Modules accepted: Orders

## 2014-06-22 NOTE — Progress Notes (Signed)
Subjective:    Patient ID: Emily Wall, female    DOB: 04/02/1936, 78 y.o.   MRN: [id number]  Patient here today for follow up of chronic medical problems. No complaints today.   Diabetes She presents for her follow-up diabetic visit. She has type 2 diabetes mellitus. No MedicAlert identification noted. The initial diagnosis of diabetes was made 5 years ago. Her disease course has been stable. There are no hypoglycemic associated symptoms. Pertinent negatives for hypoglycemia include no headaches or sweats. Pertinent negatives for diabetes include no blurred vision, no chest pain, no polydipsia, no polyphagia, no polyuria, no visual change, no weakness and no weight loss. There are no hypoglycemic complications. Symptoms are stable. There are no diabetic complications. Risk factors for coronary artery disease include dyslipidemia, hypertension, post-menopausal and sedentary lifestyle. Current diabetic treatment includes oral agent (dual therapy). She is compliant with treatment all of the time. Her weight is stable. She is following a generally healthy diet. When asked about meal planning, she reported none. She has not had a previous visit with a dietician. She rarely participates in exercise. Home blood sugar record trend: Patient doesn't shcek blood sugars at home. An ACE inhibitor/angiotensin II receptor blocker is being taken. She does not see a podiatrist.Eye exam is not current (greate rthan 2 years- Patient instructed to make appointment.).  Hypertension This is a chronic problem. The current episode started more than 1 year ago. The problem is unchanged. The problem is uncontrolled. Pertinent negatives include no blurred vision, chest pain, headaches, malaise/fatigue, orthopnea, palpitations, peripheral edema, shortness of breath or sweats. There are no associated agents to hypertension. Risk factors for coronary artery disease include diabetes mellitus, dyslipidemia, obesity, sedentary  lifestyle and post-menopausal state. Past treatments include ACE inhibitors, beta blockers and diuretics. The current treatment provides moderate improvement. Compliance problems include diet and exercise.   Hyperlipidemia This is a chronic problem. The current episode started more than 1 year ago. The problem is controlled. Recent lipid tests were reviewed and are normal. She has no history of diabetes. Pertinent negatives include no chest pain, leg pain, myalgias or shortness of breath. Current antihyperlipidemic treatment includes statins. The current treatment provides significant improvement of lipids. There are no compliance problems.  Risk factors for coronary artery disease include diabetes mellitus, hypertension, post-menopausal and a sedentary lifestyle.  Peripheral edema Lasix daily- keeps swelling down. Anemia Ferrous sulfate daily  Review of Systems  Constitutional: Negative for weight loss and malaise/fatigue.  Eyes: Negative for blurred vision.  Respiratory: Negative for shortness of breath.   Cardiovascular: Negative for chest pain, palpitations and orthopnea.  Endocrine: Negative for polydipsia, polyphagia and polyuria.  Musculoskeletal: Negative for myalgias.  Neurological: Negative for weakness and headaches.  All other systems reviewed and are negative.      Objective:   Physical Exam  Constitutional: She is oriented to person, place, and time. She appears well-developed and well-nourished.  HENT:  Nose: Nose normal.  Mouth/Throat: Oropharynx is clear and moist.  Eyes: EOM are normal.  Neck: Trachea normal, normal range of motion and full passive range of motion without pain. Neck supple. No JVD present. Carotid bruit is not present. No thyromegaly present.  Cardiovascular: Normal rate, regular rhythm and intact distal pulses.  Exam reveals no gallop and no friction rub.   Murmur (2/6 systolic) heard. bil carotid bruits left louder then right  Pulmonary/Chest:  Effort normal and breath sounds normal.  Abdominal: Soft. Bowel sounds are normal. She exhibits no distension and no mass.  There is no tenderness.  Musculoskeletal: Normal range of motion.  Lymphadenopathy:    She has no cervical adenopathy.  Neurological: She is alert and oriented to person, place, and time. She has normal reflexes.  Skin: Skin is warm and dry.  Psychiatric: She has a normal mood and affect. Her behavior is normal. Judgment and thought content normal.    BP 118/62  Pulse 81  Temp(Src) 97.9 F (36.6 C) (Oral)  Ht 5' 6"  (1.676 m)  Wt 170 lb 6.4 oz (77.293 kg)  BMI 27.52 kg/m2      Assessment & Plan:   1. Type 2 diabetes mellitus without complication   2. CARDIOMYOPATHY, SECONDARY   3. Hyperlipidemia   4. HYPERTENSION, UNSPECIFIED   5. IRON DEFICIENCY   6. Osteoporosis   7. Bilateral carotid bruits    Orders Placed This Encounter  Procedures  . DG Bone Density    Standing Status: Future     Number of Occurrences:      Standing Expiration Date: 08/22/2015    Order Specific Question:  Reason for Exam (SYMPTOM  OR DIAGNOSIS REQUIRED)    Answer:  screening    Order Specific Question:  Preferred imaging location?    Answer:  [facility]  . US Carotid Duplex Bilateral    Standing Status: Future     Number of Occurrences:      Standing Expiration Date: 08/23/2015    Order Specific Question:  Reason for exam:    Answer:  bil carotid bruit    Order Specific Question:  Preferred imaging location?    Answer:  [facility]  . NMR, lipoprofile  . POCT glycosylated hemoglobin (Hb A1C)     Labs pending Health maintenance reviewed Diet and exercise encouraged Continue all meds Follow up  In 3 months    [place], FNP

## 2014-06-23 LAB — CMP14+EGFR
ALT: 10 IU/L (ref 0–32)
AST: 12 IU/L (ref 0–40)
Albumin/Globulin Ratio: 1.3 (ref 1.1–2.5)
Albumin: 3.8 g/dL (ref 3.5–4.8)
Alkaline Phosphatase: 67 IU/L (ref 39–117)
BUN/Creatinine Ratio: 14 (ref 11–26)
BUN: 11 mg/dL (ref 8–27)
CALCIUM: 9 mg/dL (ref 8.7–10.3)
CHLORIDE: 101 mmol/L (ref 97–108)
CO2: 24 mmol/L (ref 18–29)
Creatinine, Ser: 0.77 mg/dL (ref 0.57–1.00)
GFR calc Af Amer: 87 mL/min/{1.73_m2} (ref >59)
GFR calc non Af Amer: 76 mL/min/{1.73_m2} (ref >59)
GLUCOSE: 151 mg/dL — AB (ref 65–99)
Globulin, Total: 2.9 g/dL (ref 1.5–4.5)
Potassium: 3.9 mmol/L (ref 3.5–5.2)
Sodium: 140 mmol/L (ref 134–144)
Total Bilirubin: 0.3 mg/dL (ref 0.0–1.2)
Total Protein: 6.7 g/dL (ref 6.0–8.5)

## 2014-06-23 LAB — THYROID PANEL WITH TSH
Free Thyroxine Index: 2.3 (ref 1.2–4.9)
T3 Uptake Ratio: 28 % (ref 24–39)
T4 TOTAL: 8.3 ug/dL (ref 4.5–12.0)
TSH: 0.427 u[IU]/mL — ABNORMAL LOW (ref 0.450–4.500)

## 2014-06-23 LAB — NMR, LIPOPROFILE
Cholesterol: 114 mg/dL (ref 100–199)
HDL CHOLESTEROL BY NMR: 50 mg/dL (ref >39)
HDL Particle Number: 32.6 umol/L (ref >=30.5)
LDL PARTICLE NUMBER: 624 nmol/L (ref <1000)
LDL SIZE: 20.1 nm (ref >20.5)
LDLC SERPL CALC-MCNC: 49 mg/dL (ref 0–99)
LP-IR Score: <25 (ref <=45)
Small LDL Particle Number: 412 nmol/L (ref <=527)
TRIGLYCERIDES BY NMR: 74 mg/dL (ref 0–149)

## 2014-06-24 ENCOUNTER — Telehealth: Payer: Self-pay | Admitting: Family Medicine

## 2014-06-24 NOTE — Telephone Encounter (Signed)
Message copied by Waverly Ferrari on Wed Jun 24, 2014 10:30 AM ------      Message from: Chevis Pretty      Created: Wed Jun 24, 2014  8:30 AM       Hgba1c discussed at appointment      Kidney and liver function stable      Cholesterol looks great      TSH barely low- we will keep an eye on this      Continue current meds- low fat diet and exercise and recheck in 3 months       ------

## 2014-07-01 NOTE — Telephone Encounter (Signed)
Message copied by Cline Crock on Wed Jul 01, 2014  8:45 AM ------      Message from: Chevis Pretty      Created: Wed Jun 24, 2014  8:30 AM       Hgba1c discussed at appointment      Kidney and liver function stable      Cholesterol looks great      TSH barely low- we will keep an eye on this      Continue current meds- low fat diet and exercise and recheck in 3 months       ------

## 2014-07-05 ENCOUNTER — Other Ambulatory Visit: Payer: Self-pay | Admitting: Nurse Practitioner

## 2014-07-06 ENCOUNTER — Other Ambulatory Visit: Payer: Self-pay | Admitting: Nurse Practitioner

## 2014-07-07 ENCOUNTER — Other Ambulatory Visit: Payer: Self-pay | Admitting: Nurse Practitioner

## 2014-08-28 ENCOUNTER — Telehealth: Payer: Self-pay | Admitting: Nurse Practitioner

## 2014-09-01 ENCOUNTER — Other Ambulatory Visit: Payer: Self-pay

## 2014-09-01 MED ORDER — SITAGLIPTIN PHOSPHATE 100 MG PO TABS: ORAL_TABLET | ORAL | Status: DC

## 2014-09-02 NOTE — Telephone Encounter (Signed)
Up front to pick up

## 2014-09-02 NOTE — Telephone Encounter (Signed)
No aswer on patients phone

## 2014-09-04 ENCOUNTER — Other Ambulatory Visit: Payer: Self-pay | Admitting: *Deleted

## 2014-09-04 MED ORDER — METFORMIN HCL 1000 MG PO TABS: 1000.0000 mg | ORAL_TABLET | Freq: Two times a day (BID) | ORAL | Status: DC

## 2014-09-23 ENCOUNTER — Ambulatory Visit (INDEPENDENT_AMBULATORY_CARE_PROVIDER_SITE_OTHER): Payer: Commercial Managed Care - HMO | Admitting: Pharmacist

## 2014-09-23 ENCOUNTER — Ambulatory Visit (INDEPENDENT_AMBULATORY_CARE_PROVIDER_SITE_OTHER): Payer: Commercial Managed Care - HMO

## 2014-09-23 ENCOUNTER — Encounter (INDEPENDENT_AMBULATORY_CARE_PROVIDER_SITE_OTHER): Payer: Self-pay

## 2014-09-23 ENCOUNTER — Encounter: Payer: Self-pay | Admitting: Pharmacist

## 2014-09-23 VITALS — BP 120/66 | HR 68 | Ht 65.0 in | Wt 170.0 lb

## 2014-09-23 DIAGNOSIS — M25519 Pain in unspecified shoulder: Secondary | ICD-10-CM

## 2014-09-23 DIAGNOSIS — M810 Age-related osteoporosis without current pathological fracture: Secondary | ICD-10-CM

## 2014-09-23 DIAGNOSIS — M25512 Pain in left shoulder: Secondary | ICD-10-CM

## 2014-09-23 NOTE — Patient Instructions (Signed)
Try claritin or zyrtec for allergies / itching  Fall Prevention and Home Safety Falls cause injuries and can affect all age groups. It is possible to use preventive measures to significantly decrease the likelihood of falls. There are many simple measures which can make your home safer and prevent falls. OUTDOORS  Repair cracks and edges of walkways and driveways.  Remove high doorway thresholds.  Trim shrubbery on the main path into your home.  Have good outside lighting.  Clear walkways of tools, rocks, debris, and clutter.  Check that handrails are not broken and are securely fastened. Both sides of steps should have handrails.  Have leaves, snow, and ice cleared regularly.  Use sand or salt on walkways during winter months.  In the garage, clean up grease or oil spills. BATHROOM  Install night lights.  Install grab bars by the toilet and in the tub and shower.  Use non-skid mats or decals in the tub or shower.  Place a plastic non-slip stool in the shower to sit on, if needed.  Keep floors dry and clean up all water on the floor immediately.  Remove soap buildup in the tub or shower on a regular basis.  Secure bath mats with non-slip, double-sided rug tape.  Remove throw rugs and tripping hazards from the floors. BEDROOMS  Install night lights.  Make sure a bedside light is easy to reach.  Do not use oversized bedding.  Keep a telephone by your bedside.  Have a firm chair with side arms to use for getting dressed.  Remove throw rugs and tripping hazards from the floor. KITCHEN  Keep handles on pots and pans turned toward the center of the stove. Use back burners when possible.  Clean up spills quickly and allow time for drying.  Avoid walking on wet floors.  Avoid hot utensils and knives.  Position shelves so they are not too high or low.  Place commonly used objects within easy reach.  If necessary, use a sturdy step stool with a grab bar when  reaching.  Keep electrical cables out of the way.  Do not use floor polish or wax that makes floors slippery. If you must use wax, use non-skid floor wax.  Remove throw rugs and tripping hazards from the floor. STAIRWAYS  Never leave objects on stairs.  Place handrails on both sides of stairways and use them. Fix any loose handrails. Make sure handrails on both sides of the stairways are as long as the stairs.  Check carpeting to make sure it is firmly attached along stairs. Make repairs to worn or loose carpet promptly.  Avoid placing throw rugs at the top or bottom of stairways, or properly secure the rug with carpet tape to prevent slippage. Get rid of throw rugs, if possible.  Have an electrician put in a light switch at the top and bottom of the stairs. OTHER FALL PREVENTION TIPS  Wear low-heel or rubber-soled shoes that are supportive and fit well. Wear closed toe shoes.  When using a stepladder, make sure it is fully opened and both spreaders are firmly locked. Do not climb a closed stepladder.  Add color or contrast paint or tape to grab bars and handrails in your home. Place contrasting color strips on first and last steps.  Learn and use mobility aids as needed. Install an electrical emergency response system.  Turn on lights to avoid dark areas. Replace light bulbs that burn out immediately. Get light switches that glow.  Arrange furniture to  create clear pathways. Keep furniture in the same place.  Firmly attach carpet with non-skid or double-sided tape.  Eliminate uneven floor surfaces.  Select a carpet pattern that does not visually hide the edge of steps.  Be aware of all pets. OTHER HOME SAFETY TIPS  Set the water temperature for 120 F (48.8 C).  Keep emergency numbers on or near the telephone.  Keep smoke detectors on every level of the home and near sleeping areas. Document Released: 12/01/2002 Document Revised: 06/11/2012 Document Reviewed:  03/01/2012 [facility] Patient Information 2015 [place], [place]. This information is not intended to replace advice given to you by your health care provider. Make sure you discuss any questions you have with your health care provider.               Exercise for Strong Bones  Exercise is important to build and maintain strong bones / bone density.  There are 2 types of exercises that are important to building and maintaining strong bones:  Weight- bearing and muscle-stregthening.  Weight-bearing Exercises  These exercises include activities that make you move against gravity while staying upright. Weight-bearing exercises can be high-impact or low-impact.  High-impact weight-bearing exercises help build bones and keep them strong. If you have broken a bone due to osteoporosis or are at risk of breaking a bone, you may need to avoid high-impact exercises. If you're not sure, you should check with your healthcare provider.  Examples of high-impact weight-bearing exercises are: Dancing  Doing high-impact aerobics  Hiking  Jogging/running  Jumping Rope  Stair climbing  Tennis  Low-impact weight-bearing exercises can also help keep bones strong and are a safe alternative if you cannot do high-impact exercises.   Examples of low-impact weight-bearing exercises are: Using elliptical training machines  Doing low-impact aerobics  Using stair-step machines  Fast walking on a treadmill or outside   Muscle-Strengthening Exercises These exercises include activities where you move your body, a weight or some other resistance against gravity. They are also known as resistance exercises and include: Lifting weights  Using elastic exercise bands  Using weight machines  Lifting your own body weight  Functional movements, such as standing and rising up on your toes  Yoga and Pilates can also improve strength, balance and flexibility. However, certain positions may not be safe for people with  osteoporosis or those at increased risk of broken bones. For example, exercises that have you bend forward may increase the chance of breaking a bone in the spine.   Non-Impact Exercises There are other types of exercises that can help prevent falls.  Non-impact exercises can help you to improve balance, posture and how well you move in everyday activities. Some of these exercises include: Balance exercises that strengthen your legs and test your balance, such as Tai Chi, can decrease your risk of falls.  Posture exercises that improve your posture and reduce rounded or "sloping" shoulders can help you decrease the chance of breaking a bone, especially in the spine.  Functional exercises that improve how well you move can help you with everyday activities and decrease your chance of falling and breaking a bone. For example, if you have trouble getting up from a chair or climbing stairs, you should do these activities as exercises.   **A physical therapist can teach you balance, posture and functional exercises. He/she can also help you learn which exercises are safe and appropriate for you.  [facility] has a physical therapy office in [place] in front of  our office and referrals can be made for assessments and treatment as needed and strength and balance training.  If you would like to have an assessment with [facility] and our physical therapy team please let a nurse or provider know.

## 2014-09-23 NOTE — Progress Notes (Signed)
Patient ID: Emily Wall, female   DOB: 1936-08-29, 78 y.o.   MRN: [id number]  [facility] Current Height: Height: 5\' 5"  (165.1 cm)      Max Lifetime Height:  5\' 5"  Current Weight: Weight: 170 lb (77.111 kg)       Ethnicity:African American  BP: BP: 120/66 mmHg     HR:  Pulse Rate: 68      HPI: Patient with osteoporosis on alendronate since 2007  Back Pain?  No       Kyphosis?  No Prior fracture?  No Med(s) for Osteoporosis/Osteopenia:  Alendronate 70mg  weekly since 2007 Med(s) previously tried for Osteoporosis/Osteopenia:  none                                                             PMH: Age at menopause:  65's Hysterectomy?  No Oophorectomy?  No HRT? No Steroid Use?  No Thyroid med?  No History of cancer?  No History of digestive disorders (ie Crohn's)?  No Current or previous eating disorders?  No Last Vitamin D Result:  38 (02/27/2013) Last GFR Result:  76 (06/22/2014)   FH/SH: Family history of osteoporosis?  Yes   Parent with history of hip fracture?  No Family history of breast cancer?  no Exercise?  No Smoking?  No Alcohol?  No    Calcium Assessment Calcium Intake  # of servings/day  Calcium mg  Milk (8 oz) 1  x  300  = 300mg   Yogurt (4 oz) 0 x  200 = 0  Cheese (1 oz) 1 x  200 = 200mg   Other Calcium sources   250mg   Ca supplement 600mg  = 600mg    Estimated calcium intake per day 1350mg     DEXA Results Date of Test T-Score for AP Spine L1-L4 T-Score for Total Left Hip T-Score for Total Right Hip  09/23/2014 2.4 -2.6 -2.3  09/18/2012 1.5 -2.6 -2.3  03/02/2010 -0.9 -2.6 -2.3  07/02/2006 -2.1 -2.9 -2.5    Assessment: Osteoporosis stable with alendronate  Recommendations: 1.  Continue alendronate (FOSAMAX) 70mg  1 tablet weekly 2.  continue calcium 1200mg  daily through supplementation or diet.  3.  recommend weight bearing exercise - 30 minutes at least 4 days per week.   4.  Counseled and educated about fall risk and prevention. 5.   Recommended flu vaccine - patient refused     Recheck DEXA:  2 years  Time spent counseling patient:  30 minutes  Addendeum:  Patient also c/o left shoulder pain which was evaluated several years ago by ortho - Dr Veverly Fells.  Received 2 steroid injections but these did not help.  Patient in interested in PT.  Spoke with Dr Laurance Flatten and he recommended first to get xray of left shoulder.  Order placed.

## 2014-09-29 ENCOUNTER — Other Ambulatory Visit: Payer: Self-pay | Admitting: *Deleted

## 2014-09-29 MED ORDER — CARVEDILOL 25 MG PO TABS: ORAL_TABLET | ORAL | Status: DC

## 2014-10-06 ENCOUNTER — Other Ambulatory Visit: Payer: Self-pay | Admitting: *Deleted

## 2014-10-06 MED ORDER — METFORMIN HCL 1000 MG PO TABS: 1000.0000 mg | ORAL_TABLET | Freq: Two times a day (BID) | ORAL | Status: DC

## 2014-10-15 ENCOUNTER — Other Ambulatory Visit: Payer: Self-pay | Admitting: Nurse Practitioner

## 2014-10-26 ENCOUNTER — Ambulatory Visit (INDEPENDENT_AMBULATORY_CARE_PROVIDER_SITE_OTHER): Payer: Commercial Managed Care - HMO | Admitting: Nurse Practitioner

## 2014-10-26 ENCOUNTER — Encounter: Payer: Self-pay | Admitting: Nurse Practitioner

## 2014-10-26 VITALS — BP 149/84 | HR 87 | Temp 98.6°F | Ht 65.0 in | Wt 172.0 lb

## 2014-10-26 DIAGNOSIS — E119 Type 2 diabetes mellitus without complications: Secondary | ICD-10-CM

## 2014-10-26 DIAGNOSIS — R0989 Other specified symptoms and signs involving the circulatory and respiratory systems: Secondary | ICD-10-CM

## 2014-10-26 DIAGNOSIS — M810 Age-related osteoporosis without current pathological fracture: Secondary | ICD-10-CM

## 2014-10-26 DIAGNOSIS — E785 Hyperlipidemia, unspecified: Secondary | ICD-10-CM

## 2014-10-26 DIAGNOSIS — D509 Iron deficiency anemia, unspecified: Secondary | ICD-10-CM

## 2014-10-26 DIAGNOSIS — I10 Essential (primary) hypertension: Secondary | ICD-10-CM

## 2014-10-26 LAB — POCT GLYCOSYLATED HEMOGLOBIN (HGB A1C): Hemoglobin A1C: 7.1

## 2014-10-26 NOTE — Patient Instructions (Signed)
Diabetes and Exercise Exercising regularly is important. It is not just about losing weight. It has many health benefits, such as:  Improving your overall fitness, flexibility, and endurance.  Increasing your bone density.  Helping with weight control.  Decreasing your body fat.  Increasing your muscle strength.  Reducing stress and tension.  Improving your overall health. People with diabetes who exercise gain additional benefits because exercise:  Reduces appetite.  Improves the body's use of blood sugar (glucose).  Helps lower or control blood glucose.  Decreases blood pressure.  Helps control blood lipids (such as cholesterol and triglycerides).  Improves the body's use of the hormone insulin by:  Increasing the body's insulin sensitivity.  Reducing the body's insulin needs.  Decreases the risk for heart disease because exercising:  Lowers cholesterol and triglycerides levels.  Increases the levels of good cholesterol (such as high-density lipoproteins [HDL]) in the body.  Lowers blood glucose levels. YOUR ACTIVITY PLAN  Choose an activity that you enjoy and set realistic goals. Your health care provider or diabetes educator can help you make an activity plan that works for you. Exercise regularly as directed by your health care provider. This includes:  Performing resistance training twice a week such as push-ups, sit-ups, lifting weights, or using resistance bands.  Performing 150 minutes of cardio exercises each week such as walking, running, or playing sports.  Staying active and spending no more than 90 minutes at one time being inactive. Even short bursts of exercise are good for you. Three 10-minute sessions spread throughout the day are just as beneficial as a single 30-minute session. Some exercise ideas include:  Taking the dog for a walk.  Taking the stairs instead of the elevator.  Dancing to your favorite song.  Doing an exercise  video.  Doing your favorite exercise with a friend. RECOMMENDATIONS FOR EXERCISING WITH TYPE 1 OR TYPE 2 DIABETES   Check your blood glucose before exercising. If blood glucose levels are greater than 240 mg/dL, check for urine ketones. Do not exercise if ketones are present.  Avoid injecting insulin into areas of the body that are going to be exercised. For example, avoid injecting insulin into:  The arms when playing tennis.  The legs when jogging.  Keep a record of:  Food intake before and after you exercise.  Expected peak times of insulin action.  Blood glucose levels before and after you exercise.  The type and amount of exercise you have done.  Review your records with your health care provider. Your health care provider will help you to develop guidelines for adjusting food intake and insulin amounts before and after exercising.  If you take insulin or oral hypoglycemic agents, watch for signs and symptoms of hypoglycemia. They include:  Dizziness.  Shaking.  Sweating.  Chills.  Confusion.  Drink plenty of water while you exercise to prevent dehydration or heat stroke. Body water is lost during exercise and must be replaced.  Talk to your health care provider before starting an exercise program to make sure it is safe for you. Remember, almost any type of activity is better than none. Document Released: 03/02/2004 Document Revised: 04/27/2014 Document Reviewed: 05/20/2013 Exit[employer] Patient Information 2015 [employer]. This information is not intended to replace advice given to you by your health care provider. Make sure you discuss any questions you have with your health care provider.  

## 2014-10-26 NOTE — Progress Notes (Signed)
Subjective:    Patient ID: Emily Wall, female    DOB: [birth year], 78 y.o.   MRN: [id number]  Patient here today for follow up of chronic medical problems. No complaints today.   Diabetes She presents for her follow-up diabetic visit. She has type 2 diabetes mellitus. Pertinent negatives for hypoglycemia include no headaches. Pertinent negatives for diabetes include no chest pain, no polydipsia, no polyphagia, no polyuria, no visual change and no weakness. Risk factors for coronary artery disease include diabetes mellitus, dyslipidemia, hypertension and post-menopausal. Current diabetic treatment includes oral agent (dual therapy). She is compliant with treatment all of the time.  Hypertension This is a chronic problem. The current episode started more than 1 year ago. Pertinent negatives include no chest pain, headaches, palpitations or shortness of breath. Risk factors for coronary artery disease include diabetes mellitus and dyslipidemia. Past treatments include diuretics. The current treatment provides moderate improvement.  Hyperlipidemia This is a chronic problem. The current episode started more than 1 year ago. The problem is controlled. Recent lipid tests were reviewed and are high. Pertinent negatives include no chest pain, myalgias or shortness of breath. Current antihyperlipidemic treatment includes statins. The current treatment provides mild improvement of lipids. There are no compliance problems.  Risk factors for coronary artery disease include dyslipidemia, diabetes mellitus, hypertension and post-menopausal.  Peripheral edema Lasix daily- keeps swelling down. Anemia Ferrous sulfate daily  Review of Systems  Respiratory: Negative for shortness of breath.   Cardiovascular: Negative for chest pain and palpitations.  Endocrine: Negative for polydipsia, polyphagia and polyuria.  Musculoskeletal: Negative for myalgias.  Neurological: Negative for weakness and headaches.  All  other systems reviewed and are negative.      Objective:   Physical Exam  Constitutional: She is oriented to person, place, and time. She appears well-developed and well-nourished.  HENT:  Head: Normocephalic.  Nose: Nose normal.  Mouth/Throat: Oropharynx is clear and moist.  Eyes: EOM are normal. Pupils are equal, round, and reactive to light.  Neck: Trachea normal, normal range of motion and full passive range of motion without pain. Neck supple. No JVD present. Carotid bruit is not present. No thyromegaly present.  Cardiovascular: Normal rate, regular rhythm and intact distal pulses.  Exam reveals no gallop and no friction rub.   Murmur (4/6 systolic murmur) heard. Bilateral carotid bruits  Pulmonary/Chest: Effort normal and breath sounds normal.  Abdominal: Soft. Bowel sounds are normal. She exhibits no distension and no mass. There is no tenderness.  Musculoskeletal: Normal range of motion.  Lymphadenopathy:    She has no cervical adenopathy.  Neurological: She is alert and oriented to person, place, and time. She has normal reflexes.  Skin: Skin is warm and dry.  Psychiatric: She has a normal mood and affect. Her behavior is normal. Judgment and thought content normal.    BP 149/84 mmHg  Pulse 87  Temp(Src) 98.6 F (37 C) (Oral)  Ht 5' 5"  (1.651 m)  Wt 172 lb (78.019 kg)  BMI 28.62 kg/m2   Results for orders placed or performed in visit on 10/26/14  POCT glycosylated hemoglobin (Hb A1C)  Result Value Ref Range   Hemoglobin A1C 7.1         Assessment & Plan:   1. Type 2 diabetes mellitus without complication **2carb counting* - POCT glycosylated hemoglobin (Hb A1C) - CMP14+EGFR - NMR, lipoprofile  2. Hyperlipidemia Low fat diet  3. Osteoporosis Weight bearing exercises  4. Iron deficiency anemia  5. Essential hypertension Avoid NA+  In diet  6. Bilateral carotid bruits - US Carotid Duplex Bilateral; Future    Labs pending Health maintenance  reviewed Diet and exercise encouraged Continue all meds Follow up  In 3 month   [place], FNP

## 2014-10-27 LAB — CMP14+EGFR
A/G RATIO: 1.4 (ref 1.1–2.5)
ALK PHOS: 61 IU/L (ref 39–117)
ALT: 9 IU/L (ref 0–32)
AST: 16 IU/L (ref 0–40)
Albumin: 3.7 g/dL (ref 3.5–4.8)
BILIRUBIN TOTAL: 0.3 mg/dL (ref 0.0–1.2)
BUN/Creatinine Ratio: 10 — ABNORMAL LOW (ref 11–26)
BUN: 9 mg/dL (ref 8–27)
CO2: 27 mmol/L (ref 18–29)
Calcium: 8.8 mg/dL (ref 8.7–10.3)
Chloride: 102 mmol/L (ref 97–108)
Creatinine, Ser: 0.94 mg/dL (ref 0.57–1.00)
GFR calc Af Amer: 69 mL/min/{1.73_m2} (ref >59)
GFR, EST NON AFRICAN AMERICAN: 60 mL/min/{1.73_m2} (ref >59)
GLUCOSE: 143 mg/dL — AB (ref 65–99)
Globulin, Total: 2.6 g/dL (ref 1.5–4.5)
Potassium: 4.5 mmol/L (ref 3.5–5.2)
Sodium: 142 mmol/L (ref 134–144)
TOTAL PROTEIN: 6.3 g/dL (ref 6.0–8.5)

## 2014-10-27 LAB — NMR, LIPOPROFILE
CHOLESTEROL: 129 mg/dL (ref 100–199)
HDL Cholesterol by NMR: 56 mg/dL (ref >39)
HDL Particle Number: 32 umol/L (ref >=30.5)
LDL Particle Number: 589 nmol/L (ref <1000)
LDL Size: 20.1 nm (ref >20.5)
LDL-C: 59 mg/dL (ref 0–99)
LP-IR SCORE: 29 (ref <=45)
SMALL LDL PARTICLE NUMBER: 291 nmol/L (ref <=527)
TRIGLYCERIDES BY NMR: 68 mg/dL (ref 0–149)

## 2014-10-29 ENCOUNTER — Other Ambulatory Visit: Payer: Self-pay | Admitting: Nurse Practitioner

## 2014-10-29 ENCOUNTER — Ambulatory Visit (HOSPITAL_COMMUNITY)
Admission: RE | Admit: 2014-10-29 | Discharge: 2014-10-29 | Disposition: A | Discharge status: Home / Self Care | Payer: Commercial Managed Care - HMO | Source: Ambulatory Visit | Attending: Nurse Practitioner | Admitting: Nurse Practitioner

## 2014-10-29 DIAGNOSIS — R0989 Other specified symptoms and signs involving the circulatory and respiratory systems: Secondary | ICD-10-CM | POA: Insufficient documentation

## 2014-10-29 DIAGNOSIS — E041 Nontoxic single thyroid nodule: Secondary | ICD-10-CM | POA: Insufficient documentation

## 2014-10-29 DIAGNOSIS — I6523 Occlusion and stenosis of bilateral carotid arteries: Secondary | ICD-10-CM | POA: Insufficient documentation

## 2014-10-29 DIAGNOSIS — E042 Nontoxic multinodular goiter: Secondary | ICD-10-CM

## 2014-11-02 ENCOUNTER — Ambulatory Visit (HOSPITAL_COMMUNITY)
Admission: RE | Admit: 2014-11-02 | Discharge: 2014-11-02 | Disposition: A | Discharge status: Home / Self Care | Payer: Medicare HMO | Source: Ambulatory Visit | Attending: Nurse Practitioner | Admitting: Nurse Practitioner

## 2014-11-02 DIAGNOSIS — E039 Hypothyroidism, unspecified: Secondary | ICD-10-CM | POA: Insufficient documentation

## 2014-11-02 DIAGNOSIS — E042 Nontoxic multinodular goiter: Secondary | ICD-10-CM | POA: Insufficient documentation

## 2014-11-03 ENCOUNTER — Other Ambulatory Visit: Payer: Self-pay | Admitting: Nurse Practitioner

## 2014-11-03 ENCOUNTER — Telehealth: Payer: Self-pay

## 2014-11-03 NOTE — Telephone Encounter (Signed)
Pt aware of U/S results; Put on recall list for repeat in November 2016 unless any change

## 2014-12-04 ENCOUNTER — Other Ambulatory Visit: Payer: Self-pay | Admitting: Family Medicine

## 2014-12-06 ENCOUNTER — Other Ambulatory Visit: Payer: Self-pay | Admitting: Nurse Practitioner

## 2015-01-08 ENCOUNTER — Other Ambulatory Visit: Payer: Self-pay | Admitting: Nurse Practitioner

## 2015-01-12 ENCOUNTER — Other Ambulatory Visit: Payer: Self-pay | Admitting: Nurse Practitioner

## 2015-01-27 ENCOUNTER — Ambulatory Visit: Payer: Commercial Managed Care - HMO | Admitting: Nurse Practitioner

## 2015-02-01 ENCOUNTER — Ambulatory Visit (INDEPENDENT_AMBULATORY_CARE_PROVIDER_SITE_OTHER): Payer: Commercial Managed Care - HMO | Admitting: Nurse Practitioner

## 2015-02-01 ENCOUNTER — Encounter: Payer: Self-pay | Admitting: Nurse Practitioner

## 2015-02-01 VITALS — BP 136/82 | HR 79 | Temp 97.0°F | Ht 65.0 in | Wt 170.0 lb

## 2015-02-01 DIAGNOSIS — I10 Essential (primary) hypertension: Secondary | ICD-10-CM | POA: Diagnosis not present

## 2015-02-01 DIAGNOSIS — E119 Type 2 diabetes mellitus without complications: Secondary | ICD-10-CM | POA: Diagnosis not present

## 2015-02-01 DIAGNOSIS — D509 Iron deficiency anemia, unspecified: Secondary | ICD-10-CM

## 2015-02-01 DIAGNOSIS — R609 Edema, unspecified: Secondary | ICD-10-CM

## 2015-02-01 DIAGNOSIS — E785 Hyperlipidemia, unspecified: Secondary | ICD-10-CM | POA: Diagnosis not present

## 2015-02-01 DIAGNOSIS — M810 Age-related osteoporosis without current pathological fracture: Secondary | ICD-10-CM | POA: Diagnosis not present

## 2015-02-01 LAB — POCT GLYCOSYLATED HEMOGLOBIN (HGB A1C): Hemoglobin A1C: 7.4

## 2015-02-01 MED ORDER — CARVEDILOL 25 MG PO TABS: 25.0000 mg | ORAL_TABLET | Freq: Two times a day (BID) | ORAL | Status: DC

## 2015-02-01 MED ORDER — SITAGLIPTIN PHOSPHATE 100 MG PO TABS: ORAL_TABLET | ORAL | Status: DC

## 2015-02-01 MED ORDER — LISINOPRIL 40 MG PO TABS: 40.0000 mg | ORAL_TABLET | Freq: Every day | ORAL | Status: DC

## 2015-02-01 MED ORDER — ATORVASTATIN CALCIUM 20 MG PO TABS: ORAL_TABLET | ORAL | Status: DC

## 2015-02-01 MED ORDER — METFORMIN HCL 1000 MG PO TABS: 1000.0000 mg | ORAL_TABLET | Freq: Two times a day (BID) | ORAL | Status: DC

## 2015-02-01 MED ORDER — FUROSEMIDE 20 MG PO TABS: 20.0000 mg | ORAL_TABLET | Freq: Every day | ORAL | Status: DC | PRN

## 2015-02-01 MED ORDER — ALENDRONATE SODIUM 70 MG PO TABS: ORAL_TABLET | ORAL | Status: DC

## 2015-02-01 NOTE — Progress Notes (Signed)
Subjective:    Patient ID: Emily Wall, female    DOB: [birth year], 79 y.o.   MRN: [id number]  Patient here today for follow up of chronic medical problems. No complaints today.   Diabetes She presents for her follow-up diabetic visit. She has type 2 diabetes mellitus. Pertinent negatives for hypoglycemia include no headaches. Pertinent negatives for diabetes include no chest pain, no polydipsia, no polyphagia, no polyuria, no visual change and no weakness. Risk factors for coronary artery disease include diabetes mellitus, dyslipidemia, hypertension and post-menopausal. Current diabetic treatment includes oral agent (dual therapy). She is compliant with treatment all of the time. Her weight is stable. She is following a diabetic diet. When asked about meal planning, she reported none. She has not had a previous visit with a dietitian. She never participates in exercise. Her breakfast blood glucose is taken between 8-9 am. Her breakfast blood glucose range is generally 130-140 mg/dl. Her overall blood glucose range is 130-140 mg/dl. An ACE inhibitor/angiotensin II receptor blocker is being taken. She does not see a podiatrist.Eye exam is not current.  Hypertension This is a chronic problem. The current episode started more than 1 year ago. Pertinent negatives include no chest pain, headaches, palpitations or shortness of breath. Risk factors for coronary artery disease include diabetes mellitus and dyslipidemia. Past treatments include diuretics. The current treatment provides moderate improvement.  Hyperlipidemia This is a chronic problem. The current episode started more than 1 year ago. The problem is controlled. Recent lipid tests were reviewed and are high. Pertinent negatives include no chest pain, myalgias or shortness of breath. Current antihyperlipidemic treatment includes statins. The current treatment provides mild improvement of lipids. There are no compliance problems.  Risk factors for  coronary artery disease include dyslipidemia, diabetes mellitus, hypertension and post-menopausal.  Peripheral edema Lasix daily- keeps swelling down. Anemia Ferrous sulfate daily  Review of Systems  Respiratory: Negative for shortness of breath.   Cardiovascular: Negative for chest pain and palpitations.  Endocrine: Negative for polydipsia, polyphagia and polyuria.  Musculoskeletal: Negative for myalgias.  Neurological: Negative for weakness and headaches.  All other systems reviewed and are negative.      Objective:   Physical Exam  Constitutional: She is oriented to person, place, and time. She appears well-developed and well-nourished.  HENT:  Head: Normocephalic.  Nose: Nose normal.  Mouth/Throat: Oropharynx is clear and moist.  Eyes: EOM are normal. Pupils are equal, round, and reactive to light.  Neck: Trachea normal, normal range of motion and full passive range of motion without pain. Neck supple. No JVD present. Carotid bruit is not present. No thyromegaly present.  Cardiovascular: Normal rate, regular rhythm and intact distal pulses.  Exam reveals no gallop and no friction rub.   Murmur (4/6 systolic murmur) heard. Bilateral carotid bruits  Pulmonary/Chest: Effort normal and breath sounds normal.  Abdominal: Soft. Bowel sounds are normal. She exhibits no distension and no mass. There is no tenderness.  Musculoskeletal: Normal range of motion.  Lymphadenopathy:    She has no cervical adenopathy.  Neurological: She is alert and oriented to person, place, and time. She has normal reflexes.  Skin: Skin is warm and dry.  Psychiatric: She has a normal mood and affect. Her behavior is normal. Judgment and thought content normal.    BP 168/80 mmHg  Pulse 79  Temp(Src) 97 F (36.1 C) (Oral)  Ht 5' 5"  (1.651 m)  Wt 170 lb (77.111 kg)  BMI 28.29 kg/m2   Results for orders placed or  performed in visit on 02/01/15  POCT glycosylated hemoglobin (Hb A1C)  Result Value Ref  Range   Hemoglobin A1C 7.4%         Assessment & Plan:   1. Type 2 diabetes mellitus without complication Watch carbs in diet - POCT glycosylated hemoglobin (Hb A1C) - metFORMIN (GLUCOPHAGE) 1000 MG tablet; Take 1 tablet (1,000 mg total) by mouth 2 (two) times daily with a meal.  Dispense: 60 tablet; Refill: 5 - sitaGLIPtin (JANUVIA) 100 MG tablet; TAKE ONE TABLET BY MOUTH ONE  TIME DAILY  Dispense: 60 tablet; Refill: 5  2. Hyperlipidemia Low fat diet - NMR, lipoprofile - atorvastatin (LIPITOR) 20 MG tablet; TAKE ONE TABLET BY MOUTH ONE  TIME DAILY  Dispense: 30 tablet; Refill: 5  3. Essential hypertension Do not add slat to diet - CMP14+EGFR - carvedilol (COREG) 25 MG tablet; Take 1 tablet (25 mg total) by mouth 2 (two) times daily with a meal.  Dispense: 60 tablet; Refill: 5 - lisinopril (PRINIVIL,ZESTRIL) 40 MG tablet; Take 1 tablet (40 mg total) by mouth daily.  Dispense: 30 tablet; Refill: 5  4. Iron deficiency anemia Labs pending  5. Peripheral edema Elevate legs when sitting - furosemide (LASIX) 20 MG tablet; Take 1 tablet (20 mg total) by mouth daily as needed.  Dispense: 30 tablet; Refill: 5  6. Osteoporosis Weight bearing exercises - alendronate (FOSAMAX) 70 MG tablet; TAKE 1 TABLET BY MOUTH ONCE A WEEK WITH A FULL GLASS OF WATER ON AN EM PTY STOMACH AS INSTRUCTED  Dispense: 4 tablet; Refill: 5    Labs pending Health maintenance reviewed Diet and exercise encouraged Continue all meds Follow up  In 3 month   [place], FNP

## 2015-02-01 NOTE — Patient Instructions (Signed)

## 2015-02-02 LAB — CMP14+EGFR
A/G RATIO: 1.4 (ref 1.1–2.5)
ALT: 7 IU/L (ref 0–32)
AST: 12 IU/L (ref 0–40)
Albumin: 3.7 g/dL (ref 3.5–4.8)
Alkaline Phosphatase: 70 IU/L (ref 39–117)
BUN / CREAT RATIO: 13 (ref 11–26)
BUN: 9 mg/dL (ref 8–27)
Bilirubin Total: 0.3 mg/dL (ref 0.0–1.2)
CHLORIDE: 103 mmol/L (ref 97–108)
CO2: 27 mmol/L (ref 18–29)
Calcium: 9 mg/dL (ref 8.7–10.3)
Creatinine, Ser: 0.71 mg/dL (ref 0.57–1.00)
GFR calc non Af Amer: 83 mL/min/{1.73_m2} (ref >59)
GFR, EST AFRICAN AMERICAN: 96 mL/min/{1.73_m2} (ref >59)
GLUCOSE: 133 mg/dL — AB (ref 65–99)
Globulin, Total: 2.6 g/dL (ref 1.5–4.5)
POTASSIUM: 4.2 mmol/L (ref 3.5–5.2)
Sodium: 141 mmol/L (ref 134–144)
Total Protein: 6.3 g/dL (ref 6.0–8.5)

## 2015-02-02 LAB — ANEMIA PROFILE B
BASOS: 1 %
Basophils Absolute: 0 10*3/uL (ref 0.0–0.2)
EOS ABS: 0.1 10*3/uL (ref 0.0–0.4)
Eos: 2 %
Ferritin: 71 ng/mL (ref 15–150)
Folate: 12.2 ng/mL (ref >3.0)
HCT: 36.5 % (ref 34.0–46.6)
HEMOGLOBIN: 12.1 g/dL (ref 11.1–15.9)
Immature Grans (Abs): 0 10*3/uL (ref 0.0–0.1)
Immature Granulocytes: 0 %
Iron Saturation: 25 % (ref 15–55)
Iron: 57 ug/dL (ref 27–139)
Lymphocytes Absolute: 1.8 10*3/uL (ref 0.7–3.1)
Lymphs: 30 %
MCH: 30.6 pg (ref 26.6–33.0)
MCHC: 33.2 g/dL (ref 31.5–35.7)
MCV: 92 fL (ref 79–97)
MONOS ABS: 0.3 10*3/uL (ref 0.1–0.9)
Monocytes: 5 %
Neutrophils Absolute: 3.6 10*3/uL (ref 1.4–7.0)
Neutrophils Relative %: 62 %
Platelets: 193 10*3/uL (ref 150–379)
RBC: 3.96 x10E6/uL (ref 3.77–5.28)
RDW: 13.9 % (ref 12.3–15.4)
Retic Ct Pct: 0.5 % — ABNORMAL LOW (ref 0.6–2.6)
Total Iron Binding Capacity: 231 ug/dL — ABNORMAL LOW (ref 250–450)
UIBC: 174 ug/dL (ref 118–369)
Vitamin B-12: 224 pg/mL (ref 211–946)
WBC: 5.9 10*3/uL (ref 3.4–10.8)

## 2015-02-02 LAB — NMR, LIPOPROFILE
Cholesterol: 116 mg/dL (ref 100–199)
HDL Cholesterol by NMR: 53 mg/dL (ref >39)
HDL PARTICLE NUMBER: 30.6 umol/L (ref >=30.5)
LDL PARTICLE NUMBER: 698 nmol/L (ref <1000)
LDL Size: 20 nm (ref >20.5)
LDL-C: 53 mg/dL (ref 0–99)
LP-IR SCORE: 32 (ref <=45)
Small LDL Particle Number: 465 nmol/L (ref <=527)
Triglycerides by NMR: 50 mg/dL (ref 0–149)

## 2015-03-22 DIAGNOSIS — H5203 Hypermetropia, bilateral: Secondary | ICD-10-CM | POA: Diagnosis not present

## 2015-03-22 DIAGNOSIS — E11329 Type 2 diabetes mellitus with mild nonproliferative diabetic retinopathy without macular edema: Secondary | ICD-10-CM | POA: Diagnosis not present

## 2015-03-22 DIAGNOSIS — H40013 Open angle with borderline findings, low risk, bilateral: Secondary | ICD-10-CM | POA: Diagnosis not present

## 2015-03-22 DIAGNOSIS — Z0100 Encounter for examination of eyes and vision without abnormal findings: Secondary | ICD-10-CM | POA: Diagnosis not present

## 2015-03-22 DIAGNOSIS — H25813 Combined forms of age-related cataract, bilateral: Secondary | ICD-10-CM | POA: Diagnosis not present

## 2015-03-30 ENCOUNTER — Encounter: Payer: Self-pay | Admitting: Cardiology

## 2015-03-30 ENCOUNTER — Ambulatory Visit (INDEPENDENT_AMBULATORY_CARE_PROVIDER_SITE_OTHER): Payer: Commercial Managed Care - HMO | Admitting: Cardiology

## 2015-03-30 VITALS — BP 130/70 | HR 75 | Ht 65.0 in | Wt 167.0 lb

## 2015-03-30 DIAGNOSIS — I429 Cardiomyopathy, unspecified: Secondary | ICD-10-CM

## 2015-03-30 DIAGNOSIS — I359 Nonrheumatic aortic valve disorder, unspecified: Secondary | ICD-10-CM | POA: Diagnosis not present

## 2015-03-30 DIAGNOSIS — I10 Essential (primary) hypertension: Secondary | ICD-10-CM | POA: Diagnosis not present

## 2015-03-30 NOTE — Patient Instructions (Signed)

## 2015-03-30 NOTE — Progress Notes (Signed)
Cardiology Office Note  Date: 03/30/2015   ID: Emily Wall, DOB May 17, 1936, MRN [id number]  PCP: Chevis Pretty, FNP  Primary Cardiologist: Rozann Lesches, MD   Chief Complaint  Patient presents with  . Aortic valve disease  . History of cardiomyopathy    History of Present Illness: Emily Wall is a 79 y.o. female last seen in April 2015. She presents for a routine follow-up visit. Over the last year, she does not endorse any recurring chest pain or progressive shortness of breath, no palpitations or syncope. Follow-up tracing is noted below. We reviewed her medications, stable from a cardiac perspective as detailed below.  Her last echocardiogram was a little over a year ago, documented below.   Past Medical History  Diagnosis Date  . Aortic stenosis     Mild to moderate  . Aortic regurgitation     Mild  . Nonischemic cardiomyopathy     LVEF improved to 50-55%  . Type 2 diabetes mellitus   . Coronary atherosclerosis of native coronary artery     Nonobstructive 2007  . Essential hypertension, benign   . Osteoporosis   . Hypothyroidism   . Hyperlipidemia      Current Outpatient Prescriptions  Medication Sig Dispense Refill  . alendronate (FOSAMAX) 70 MG tablet TAKE 1 TABLET BY MOUTH ONCE A WEEK WITH A FULL GLASS OF WATER ON AN EM PTY STOMACH AS INSTRUCTED 4 tablet 5  . aspirin 325 MG tablet Take 325 mg by mouth daily.      Marland Kitchen atorvastatin (LIPITOR) 20 MG tablet TAKE ONE TABLET BY MOUTH ONE  TIME DAILY 30 tablet 5  . Calcium Carbonate-Vitamin D (CALCIUM + D PO) Take 1 tablet by mouth daily.      . carvedilol (COREG) 25 MG tablet Take 1 tablet (25 mg total) by mouth 2 (two) times daily with a meal. 60 tablet 5  . cholecalciferol (VITAMIN D) 1000 UNITS tablet Take 1,000 Units by mouth daily.     . ferrous sulfate 325 (65 FE) MG tablet Take 1 tablet (325 mg total) by mouth daily with breakfast. 30 tablet 5  . furosemide (LASIX) 20 MG tablet Take 1 tablet  (20 mg total) by mouth daily as needed. 30 tablet 5  . glucose blood test strip     . LANCETS ULTRA THIN MISC     . lisinopril (PRINIVIL,ZESTRIL) 40 MG tablet Take 1 tablet (40 mg total) by mouth daily. 30 tablet 5  . metFORMIN (GLUCOPHAGE) 1000 MG tablet Take 1 tablet (1,000 mg total) by mouth 2 (two) times daily with a meal. 60 tablet 5  . sitaGLIPtin (JANUVIA) 100 MG tablet TAKE ONE TABLET BY MOUTH ONE  TIME DAILY 60 tablet 5  . triamcinolone cream (KENALOG) 0.1 % Apply 1 application topically daily.      No current facility-administered medications for this visit.    Allergies:  Review of patient's allergies indicates no known allergies.   Social History: The patient  reports that she quit smoking about 26 years ago. Her smoking use included Cigarettes. She has a 15 pack-year smoking history. She has never used smokeless tobacco. She reports that she does not drink alcohol or use illicit drugs.   ROS:  Please see the history of present illness. Otherwise, complete review of systems is positive for none.  All other systems are reviewed and negative.   Physical Exam: VS:  BP 130/70 mmHg  Pulse 75  Ht 5\' 5"  (1.651 m)  Wt 167 lb (  75.751 kg)  BMI 27.79 kg/m2  SpO2 98%, BMI Body mass index is 27.79 kg/(m^2).  Wt Readings from Last 3 Encounters:  03/30/15 167 lb (75.751 kg)  02/01/15 170 lb (77.111 kg)  10/26/14 172 lb (78.019 kg)     Overweight woman in no acute distress.  HEENT: Conjunctiva and lids normal, oropharynx with moist mucosa.  Neck: Supple, no elevated JVP or bruits.  Lungs: Clear to auscultation, nonlabored.  Cardiac: Regular rate and rhythm, 3/6 systolic murmur heard at the base, second heart sound preserved, no S3 gallop or pericardial rub, no loud diastolic murmur.  Abdomen: Soft, nontender, bowel sounds present.  Extremities: No pitting edema, distal pulses full.    ECG: ECG is ordered today and reviewed showing normal sinus rhythm with decreased R wave  progression in the anteroseptal leads, leftward axis, nonspecific T-wave changes.   Recent Labwork: 06/22/2014: TSH 0.427* 02/01/2015: ALT 7; AST 12; BUN 9; Creatinine 0.71; Hemoglobin 12.1; Platelets 193; Potassium 4.2; Sodium 141     Component Value Date/Time   CHOL 116 02/01/2015 0913   CHOL 119 06/02/2013 1318   TRIG 50 02/01/2015 0913   TRIG 41 06/02/2013 1318   HDL 53 02/01/2015 0913   HDL 52 06/02/2013 1318   LDLCALC 49 06/22/2014 1630   LDLCALC 59 06/02/2013 1318    Other Studies Reviewed Today:  Echocardiogram from February 2015 demonstrated moderate LVH with LVEF 35-59%, grade 1 diastolic dysfunction, calcified aortic annulus with moderate stenosis and moderate regurgitation, mean gradient 23 mm mercury, mild mitral regurgitation, moderate left atrial enlargement, mild tricuspid regurgitation, normal RV-RA gradient.  Assessment and Plan:  1. Aortic valve disease, moderate aortic stenosis and regurgitation as of echocardiogram from February 2015. Murmur and symptoms are stable. We will obtain a follow-up echocardiogram this year as well for more objective surveillance.  2. History of cardiomyopathy, LVEF 50-55%.  3. Essential hypertension, reasonable blood pressure today.  Current medicines were reviewed with the patient today.   Orders Placed This Encounter  Procedures  . EKG 12-Lead  . 2D Echocardiogram without contrast    Disposition: FU with me in 1 year.   Signed, Satira Sark, MD, [facility] 03/30/2015 9:55 AM    [facility] [place], [place], [place] [address] Phone: [phone]; Fax: [phone]

## 2015-04-15 ENCOUNTER — Ambulatory Visit (INDEPENDENT_AMBULATORY_CARE_PROVIDER_SITE_OTHER): Payer: Commercial Managed Care - HMO

## 2015-04-15 DIAGNOSIS — I429 Cardiomyopathy, unspecified: Secondary | ICD-10-CM | POA: Diagnosis not present

## 2015-04-15 DIAGNOSIS — I359 Nonrheumatic aortic valve disorder, unspecified: Secondary | ICD-10-CM

## 2015-04-21 ENCOUNTER — Telehealth: Payer: Self-pay | Admitting: *Deleted

## 2015-04-21 NOTE — Telephone Encounter (Signed)
reviewed the report, aortic regurgitation is moderate and stable per Dr. Domenic Polite.  Patient informed.

## 2015-05-10 ENCOUNTER — Other Ambulatory Visit: Payer: Self-pay | Admitting: Nurse Practitioner

## 2015-05-10 DIAGNOSIS — E119 Type 2 diabetes mellitus without complications: Secondary | ICD-10-CM

## 2015-05-10 MED ORDER — SITAGLIPTIN PHOSPHATE 100 MG PO TABS: ORAL_TABLET | ORAL | Status: DC

## 2015-05-10 NOTE — Telephone Encounter (Signed)
Patient aware samples are up front.

## 2015-05-13 ENCOUNTER — Telehealth: Payer: Self-pay | Admitting: Nurse Practitioner

## 2015-05-14 NOTE — Telephone Encounter (Signed)
Dr would not approve

## 2015-05-20 ENCOUNTER — Other Ambulatory Visit: Payer: Self-pay | Admitting: Nurse Practitioner

## 2015-05-26 ENCOUNTER — Other Ambulatory Visit: Payer: Self-pay

## 2015-05-26 MED ORDER — TRIAMCINOLONE ACETONIDE 0.1 % EX CREA: 1.0000 "application " | TOPICAL_CREAM | Freq: Every day | CUTANEOUS | Status: DC

## 2015-07-08 ENCOUNTER — Telehealth: Payer: Self-pay | Admitting: Nurse Practitioner

## 2015-07-08 MED ORDER — TRIAMCINOLONE ACETONIDE 0.1 % EX CREA: 1.0000 "application " | TOPICAL_CREAM | Freq: Every day | CUTANEOUS | Status: DC

## 2015-07-08 NOTE — Telephone Encounter (Signed)
Aware of samples.

## 2015-07-08 NOTE — Telephone Encounter (Signed)
Januvia 100mg , quantity 35 samples at front. Patient requesting greater quantity of cream .

## 2015-07-08 NOTE — Telephone Encounter (Signed)
Ok for [place] samples please

## 2015-07-21 DIAGNOSIS — H40013 Open angle with borderline findings, low risk, bilateral: Secondary | ICD-10-CM | POA: Diagnosis not present

## 2015-07-21 DIAGNOSIS — E11329 Type 2 diabetes mellitus with mild nonproliferative diabetic retinopathy without macular edema: Secondary | ICD-10-CM | POA: Diagnosis not present

## 2015-07-21 DIAGNOSIS — H25813 Combined forms of age-related cataract, bilateral: Secondary | ICD-10-CM | POA: Diagnosis not present

## 2015-08-01 ENCOUNTER — Other Ambulatory Visit: Payer: Self-pay | Admitting: Nurse Practitioner

## 2015-08-03 ENCOUNTER — Encounter: Payer: Self-pay | Admitting: Nurse Practitioner

## 2015-08-03 ENCOUNTER — Ambulatory Visit (INDEPENDENT_AMBULATORY_CARE_PROVIDER_SITE_OTHER): Payer: Commercial Managed Care - HMO | Admitting: Nurse Practitioner

## 2015-08-03 VITALS — BP 126/84 | HR 74 | Temp 97.1°F | Ht 65.0 in | Wt 165.0 lb

## 2015-08-03 DIAGNOSIS — D509 Iron deficiency anemia, unspecified: Secondary | ICD-10-CM | POA: Diagnosis not present

## 2015-08-03 DIAGNOSIS — E785 Hyperlipidemia, unspecified: Secondary | ICD-10-CM

## 2015-08-03 DIAGNOSIS — R609 Edema, unspecified: Secondary | ICD-10-CM

## 2015-08-03 DIAGNOSIS — M810 Age-related osteoporosis without current pathological fracture: Secondary | ICD-10-CM | POA: Diagnosis not present

## 2015-08-03 DIAGNOSIS — Z6827 Body mass index (BMI) 27.0-27.9, adult: Secondary | ICD-10-CM

## 2015-08-03 DIAGNOSIS — R600 Localized edema: Secondary | ICD-10-CM

## 2015-08-03 DIAGNOSIS — E119 Type 2 diabetes mellitus without complications: Secondary | ICD-10-CM | POA: Diagnosis not present

## 2015-08-03 DIAGNOSIS — I10 Essential (primary) hypertension: Secondary | ICD-10-CM | POA: Diagnosis not present

## 2015-08-03 LAB — POCT GLYCOSYLATED HEMOGLOBIN (HGB A1C): Hemoglobin A1C: 7.3

## 2015-08-03 MED ORDER — CARVEDILOL 25 MG PO TABS: 25.0000 mg | ORAL_TABLET | Freq: Two times a day (BID) | ORAL | Status: DC

## 2015-08-03 MED ORDER — METFORMIN HCL 1000 MG PO TABS: 1000.0000 mg | ORAL_TABLET | Freq: Two times a day (BID) | ORAL | Status: DC

## 2015-08-03 MED ORDER — FERROUS SULFATE 325 (65 FE) MG PO TABS: 325.0000 mg | ORAL_TABLET | Freq: Every day | ORAL | Status: DC

## 2015-08-03 MED ORDER — SITAGLIPTIN PHOSPHATE 100 MG PO TABS: ORAL_TABLET | ORAL | Status: DC

## 2015-08-03 MED ORDER — ATORVASTATIN CALCIUM 20 MG PO TABS: ORAL_TABLET | ORAL | Status: DC

## 2015-08-03 MED ORDER — LISINOPRIL 40 MG PO TABS: 40.0000 mg | ORAL_TABLET | Freq: Every day | ORAL | Status: DC

## 2015-08-03 MED ORDER — FUROSEMIDE 20 MG PO TABS: 20.0000 mg | ORAL_TABLET | Freq: Every day | ORAL | Status: DC | PRN

## 2015-08-03 NOTE — Patient Instructions (Signed)
Fat and Cholesterol Control Diet Fat and cholesterol levels in your blood and organs are influenced by your diet. High levels of fat and cholesterol may lead to diseases of the heart, small and large blood vessels, gallbladder, liver, and pancreas. CONTROLLING FAT AND CHOLESTEROL WITH DIET Although exercise and lifestyle factors are important, your diet is key. That is because certain foods are known to raise cholesterol and others to lower it. The goal is to balance foods for their effect on cholesterol and more importantly, to replace saturated and trans fat with other types of fat, such as monounsaturated fat, polyunsaturated fat, and omega-3 fatty acids. On average, a person should consume no more than 15 to 17 g of saturated fat daily. Saturated and trans fats are considered "bad" fats, and they will raise LDL cholesterol. Saturated fats are primarily found in animal products such as meats, butter, and cream. However, that does not mean you need to give up all your favorite foods. Today, there are good tasting, low-fat, low-cholesterol substitutes for most of the things you like to eat. Choose low-fat or nonfat alternatives. Choose round or loin cuts of red meat. These types of cuts are lowest in fat and cholesterol. Chicken (without the skin), fish, veal, and ground turkey breast are great choices. Eliminate fatty meats, such as hot dogs and salami. Even shellfish have little or no saturated fat. Have a 3 oz (85 g) portion when you eat lean meat, poultry, or fish. Trans fats are also called "partially hydrogenated oils." They are oils that have been scientifically manipulated so that they are solid at room temperature resulting in a longer shelf life and improved taste and texture of foods in which they are added. Trans fats are found in stick margarine, some tub margarines, cookies, crackers, and baked goods.  When baking and cooking, oils are a great substitute for butter. The monounsaturated oils are  especially beneficial since it is believed they lower LDL and raise HDL. The oils you should avoid entirely are saturated tropical oils, such as coconut and palm.  Remember to eat a lot from food groups that are naturally free of saturated and trans fat, including fish, fruit, vegetables, beans, grains (barley, rice, couscous, bulgur wheat), and pasta (without cream sauces).  IDENTIFYING FOODS THAT LOWER FAT AND CHOLESTEROL  Soluble fiber may lower your cholesterol. This type of fiber is found in fruits such as apples, vegetables such as broccoli, potatoes, and carrots, legumes such as beans, peas, and lentils, and grains such as barley. Foods fortified with plant sterols (phytosterol) may also lower cholesterol. You should eat at least 2 g per day of these foods for a cholesterol lowering effect.  Read package labels to identify low-saturated fats, trans fat free, and low-fat foods at the supermarket. Select cheeses that have only 2 to 3 g saturated fat per ounce. Use a heart-healthy tub margarine that is free of trans fats or partially hydrogenated oil. When buying baked goods (cookies, crackers), avoid partially hydrogenated oils. Breads and muffins should be made from whole grains (whole-wheat or whole oat flour, instead of "flour" or "enriched flour"). Buy non-creamy canned soups with reduced salt and no added fats.  FOOD PREPARATION TECHNIQUES  Never deep-fry. If you must fry, either stir-fry, which uses very little fat, or use non-stick cooking sprays. When possible, broil, bake, or roast meats, and steam vegetables. Instead of putting butter or margarine on vegetables, use lemon and herbs, applesauce, and cinnamon (for squash and sweet potatoes). Use nonfat   yogurt, salsa, and low-fat dressings for salads.  LOW-SATURATED FAT / LOW-FAT FOOD SUBSTITUTES Meats / Saturated Fat (g)  Avoid: Steak, marbled (3 oz/85 g) / 11 g  Choose: Steak, lean (3 oz/85 g) / 4 g  Avoid: Hamburger (3 oz/85 g) / 7  g  Choose: Hamburger, lean (3 oz/85 g) / 5 g  Avoid: Ham (3 oz/85 g) / 6 g  Choose: Ham, lean cut (3 oz/85 g) / 2.4 g  Avoid: Chicken, with skin, dark meat (3 oz/85 g) / 4 g  Choose: Chicken, skin removed, dark meat (3 oz/85 g) / 2 g  Avoid: Chicken, with skin, light meat (3 oz/85 g) / 2.5 g  Choose: Chicken, skin removed, light meat (3 oz/85 g) / 1 g Dairy / Saturated Fat (g)  Avoid: Whole milk (1 cup) / 5 g  Choose: Low-fat milk, 2% (1 cup) / 3 g  Choose: Low-fat milk, 1% (1 cup) / 1.5 g  Choose: Skim milk (1 cup) / 0.3 g  Avoid: Hard cheese (1 oz/28 g) / 6 g  Choose: Skim milk cheese (1 oz/28 g) / 2 to 3 g  Avoid: Cottage cheese, 4% fat (1 cup) / 6.5 g  Choose: Low-fat cottage cheese, 1% fat (1 cup) / 1.5 g  Avoid: Ice cream (1 cup) / 9 g  Choose: Sherbet (1 cup) / 2.5 g  Choose: Nonfat frozen yogurt (1 cup) / 0.3 g  Choose: Frozen fruit bar / trace  Avoid: Whipped cream (1 tbs) / 3.5 g  Choose: Nondairy whipped topping (1 tbs) / 1 g Condiments / Saturated Fat (g)  Avoid: Mayonnaise (1 tbs) / 2 g  Choose: Low-fat mayonnaise (1 tbs) / 1 g  Avoid: Butter (1 tbs) / 7 g  Choose: Extra light margarine (1 tbs) / 1 g  Avoid: Coconut oil (1 tbs) / 11.8 g  Choose: Olive oil (1 tbs) / 1.8 g  Choose: Corn oil (1 tbs) / 1.7 g  Choose: Safflower oil (1 tbs) / 1.2 g  Choose: Sunflower oil (1 tbs) / 1.4 g  Choose: Soybean oil (1 tbs) / 2.4 g  Choose: Canola oil (1 tbs) / 1 g Document Released: 12/11/2005 Document Revised: 04/07/2013 Document Reviewed: 03/11/2014 [employer] Patient Information 2015 [employer]. This information is not intended to replace advice given to you by your health care provider. Make sure you discuss any questions you have with your health care provider.  

## 2015-08-03 NOTE — Addendum Note (Signed)
Addended by: Chevis Pretty on: 08/03/2015 02:49 PM   Modules accepted: Level of Service

## 2015-08-03 NOTE — Addendum Note (Signed)
Addended by: Rolena Infante on: 08/03/2015 03:58 PM   Modules accepted: Orders

## 2015-08-03 NOTE — Progress Notes (Addendum)
Subjective:    Patient ID: Emily Wall, female    DOB: 02-01-1936, 79 y.o.   MRN: [id number]  Patient here today for follow up of chronic medical problems. No complaints today.   Diabetes She presents for her follow-up diabetic visit. She has type 2 diabetes mellitus. Pertinent negatives for hypoglycemia include no headaches. Pertinent negatives for diabetes include no chest pain, no polydipsia, no polyphagia, no polyuria, no visual change and no weakness. Risk factors for coronary artery disease include diabetes mellitus, dyslipidemia, hypertension and post-menopausal. Current diabetic treatment includes oral agent (dual therapy). She is compliant with treatment all of the time. Her weight is stable. She is following a diabetic diet. When asked about meal planning, she reported none. She has not had a previous visit with a dietitian. She never participates in exercise. Her breakfast blood glucose is taken between 8-9 am. Her breakfast blood glucose range is generally 130-140 mg/dl. Her overall blood glucose range is 130-140 mg/dl. An ACE inhibitor/angiotensin II receptor blocker is being taken. She does not see a podiatrist.Eye exam is not current.  Hypertension This is a chronic problem. The current episode started more than 1 year ago. Pertinent negatives include no chest pain, headaches, palpitations or shortness of breath. Risk factors for coronary artery disease include diabetes mellitus and dyslipidemia. Past treatments include diuretics. The current treatment provides moderate improvement.  Hyperlipidemia This is a chronic problem. The current episode started more than 1 year ago. The problem is controlled. Recent lipid tests were reviewed and are high. Pertinent negatives include no chest pain, myalgias or shortness of breath. Current antihyperlipidemic treatment includes statins. The current treatment provides mild improvement of lipids. There are no compliance problems.  Risk factors for  coronary artery disease include dyslipidemia, diabetes mellitus, hypertension and post-menopausal.  Peripheral edema Lasix daily- keeps swelling down. Anemia Ferrous sulfate daily  Review of Systems  Respiratory: Negative for shortness of breath.   Cardiovascular: Negative for chest pain and palpitations.  Endocrine: Negative for polydipsia, polyphagia and polyuria.  Musculoskeletal: Negative for myalgias.  Neurological: Negative for weakness and headaches.  All other systems reviewed and are negative.      Objective:   Physical Exam  Constitutional: She is oriented to person, place, and time. She appears well-developed and well-nourished.  HENT:  Head: Normocephalic.  Nose: Nose normal.  Mouth/Throat: Oropharynx is clear and moist.  Eyes: EOM are normal. Pupils are equal, round, and reactive to light.  Neck: Trachea normal, normal range of motion and full passive range of motion without pain. Neck supple. No JVD present. Carotid bruit is not present. No thyromegaly present.  Cardiovascular: Normal rate, regular rhythm and intact distal pulses.  Exam reveals no gallop and no friction rub.   Murmur (4/6 systolic murmur) heard. Bilateral carotid bruits  Pulmonary/Chest: Effort normal and breath sounds normal.  Abdominal: Soft. Bowel sounds are normal. She exhibits no distension and no mass. There is no tenderness.  Musculoskeletal: Normal range of motion.  Lymphadenopathy:    She has no cervical adenopathy.  Neurological: She is alert and oriented to person, place, and time. She has normal reflexes.  Skin: Skin is warm and dry.  Psychiatric: She has a normal mood and affect. Her behavior is normal. Judgment and thought content normal.   BP 126/84 mmHg  Pulse 74  Temp(Src) 97.1 F (36.2 C) (Oral)  Ht 5' 5"  (1.651 m)  Wt 165 lb (74.844 kg)  BMI 27.46 kg/m2  Results for orders placed or performed in  visit on 08/03/15  POCT glycosylated hemoglobin (Hb A1C)  Result Value Ref  Range   Hemoglobin A1C 7.3       Assessment & Plan:   1. Type 2 diabetes mellitus without complication Continue to count carbs - POCT glycosylated hemoglobin (Hb A1C) - metFORMIN (GLUCOPHAGE) 1000 MG tablet; Take 1 tablet (1,000 mg total) by mouth 2 (two) times daily with a meal.  Dispense: 60 tablet; Refill: 5 - sitaGLIPtin (JANUVIA) 100 MG tablet; TAKE ONE TABLET BY MOUTH ONE  TIME DAILY  Dispense: 28 tablet; Refill: 0  2. Hyperlipidemia Low fat diet - Lipid panel - atorvastatin (LIPITOR) 20 MG tablet; TAKE ONE TABLET BY MOUTH ONE  TIME DAILY  Dispense: 30 tablet; Refill: 5  3. Essential hypertension Do not add salt to diet - CMP14+EGFR - lisinopril (PRINIVIL,ZESTRIL) 40 MG tablet; Take 1 tablet (40 mg total) by mouth daily.  Dispense: 30 tablet; Refill: 5 - carvedilol (COREG) 25 MG tablet; Take 1 tablet (25 mg total) by mouth 2 (two) times daily with a meal.  Dispense: 60 tablet; Refill: 5  4. Osteoporosis Weight bearing exercises  5. Iron deficiency anemia  6. BMI 27.0-27.9,adult Discussed diet and exercise for person with BMI >25 Will recheck weight in 3-6 months   7. Anemia, iron deficiency - ferrous sulfate 325 (65 FE) MG tablet; Take 1 tablet (325 mg total) by mouth daily with breakfast.  Dispense: 30 tablet; Refill: 5  8. Peripheral edema Elevate legs when sitting - furosemide (LASIX) 20 MG tablet; Take 1 tablet (20 mg total) by mouth daily as needed.  Dispense: 30 tablet; Refill: 5    Labs pending Health maintenance reviewed Diet and exercise encouraged Continue all meds Follow up  In 3 months   [place], FNP

## 2015-08-04 LAB — CMP14+EGFR
ALBUMIN: 3.5 g/dL (ref 3.5–4.8)
ALT: 8 IU/L (ref 0–32)
AST: 9 IU/L (ref 0–40)
Albumin/Globulin Ratio: 1.1 (ref 1.1–2.5)
Alkaline Phosphatase: 68 IU/L (ref 39–117)
BILIRUBIN TOTAL: 0.3 mg/dL (ref 0.0–1.2)
BUN/Creatinine Ratio: 13 (ref 11–26)
BUN: 10 mg/dL (ref 8–27)
CO2: 27 mmol/L (ref 18–29)
CREATININE: 0.79 mg/dL (ref 0.57–1.00)
Calcium: 9.1 mg/dL (ref 8.7–10.3)
Chloride: 99 mmol/L (ref 97–108)
GFR, EST AFRICAN AMERICAN: 84 mL/min/{1.73_m2} (ref >59)
GFR, EST NON AFRICAN AMERICAN: 73 mL/min/{1.73_m2} (ref >59)
GLOBULIN, TOTAL: 3.3 g/dL (ref 1.5–4.5)
Glucose: 125 mg/dL — ABNORMAL HIGH (ref 65–99)
Potassium: 4.1 mmol/L (ref 3.5–5.2)
Sodium: 140 mmol/L (ref 134–144)
Total Protein: 6.8 g/dL (ref 6.0–8.5)

## 2015-08-04 LAB — LIPID PANEL
CHOLESTEROL TOTAL: 124 mg/dL (ref 100–199)
Chol/HDL Ratio: 2.5 ratio units (ref 0.0–4.4)
HDL: 50 mg/dL (ref >39)
LDL Calculated: 54 mg/dL (ref 0–99)
Triglycerides: 100 mg/dL (ref 0–149)
VLDL Cholesterol Cal: 20 mg/dL (ref 5–40)

## 2015-08-05 ENCOUNTER — Telehealth: Payer: Self-pay | Admitting: *Deleted

## 2015-08-05 NOTE — Telephone Encounter (Signed)
-----   Message from Chevis Pretty, [place] sent at 08/05/2015  9:57 AM EDT ----- Hgba1c discussed at appointment Kidney and liver function stable Cholesterol looks great Continue current meds- low fat diet and exercise and recheck in 3 months

## 2015-08-06 ENCOUNTER — Telehealth: Payer: Self-pay | Admitting: Nurse Practitioner

## 2015-08-06 NOTE — Telephone Encounter (Signed)
Samples up front. NA on patients phone

## 2015-08-10 NOTE — Telephone Encounter (Signed)
Patient aware that samples are ready to be picked up

## 2015-08-31 ENCOUNTER — Other Ambulatory Visit: Payer: Self-pay | Admitting: Nurse Practitioner

## 2015-09-22 ENCOUNTER — Other Ambulatory Visit: Payer: Self-pay | Admitting: Nurse Practitioner

## 2015-10-06 ENCOUNTER — Ambulatory Visit (INDEPENDENT_AMBULATORY_CARE_PROVIDER_SITE_OTHER): Payer: Commercial Managed Care - HMO | Admitting: Nurse Practitioner

## 2015-10-06 ENCOUNTER — Telehealth: Payer: Self-pay | Admitting: Nurse Practitioner

## 2015-10-06 ENCOUNTER — Encounter: Payer: Self-pay | Admitting: Nurse Practitioner

## 2015-10-06 ENCOUNTER — Telehealth: Payer: Self-pay | Admitting: *Deleted

## 2015-10-06 VITALS — BP 153/68 | HR 47 | Temp 98.1°F | Ht 65.0 in | Wt 161.0 lb

## 2015-10-06 DIAGNOSIS — J209 Acute bronchitis, unspecified: Secondary | ICD-10-CM | POA: Diagnosis not present

## 2015-10-06 MED ORDER — AZITHROMYCIN 500 MG PO TABS
ORAL_TABLET | ORAL | Status: DC
Start: 2015-10-06 — End: 2015-11-15

## 2015-10-06 NOTE — Telephone Encounter (Signed)
Appt made for today

## 2015-10-06 NOTE — Progress Notes (Signed)
  Subjective:     Emily Wall is a 79 y.o. female here for evaluation of a cough. Onset of symptoms was 1 month ago. Symptoms have been gradually worsening since that time. The cough is barky, dry, harsh and nonproductive and is aggravated by nothing. Associated symptoms include: change in voice, chills and shortness of breath. Patient does not have a history of asthma. Patient does not have a history of environmental allergens. Patient has not traveled recently. Patient does not have a history of smoking. Patient has not had a previous chest x-ray. Patient has not had a PPD done.  The following portions of the patient's history were reviewed and updated as appropriate: allergies, current medications, past family history, past medical history, past social history, past surgical history and problem list.  Review of Systems Pertinent items are noted in HPI.    Objective:     BP 153/68 mmHg  Pulse 47  Temp(Src) 98.1 F (36.7 C) (Oral)  Ht 5\' 5"  (1.651 m)  Wt 161 lb (73.029 kg)  BMI 26.79 kg/m2 General appearance: alert and cooperative Eyes: conjunctivae/corneas clear. PERRL, EOM's intact. Fundi benign. Ears: normal TM's and external ear canals both ears Nose: Nares normal. Septum midline. Mucosa normal. No drainage or sinus tenderness. Throat: lips, mucosa, and tongue normal; teeth and gums normal Lungs: clear to auscultation bilaterally and deep barky cough Heart: systolic murmur: early systolic 3/6, blowing at 2nd right intercostal space    Assessment:    Acute Bronchitis    Plan:   1. Take meds as prescribed 2. Use a cool mist humidifier especially during the winter months and when heat has been humid. 3. Use saline nose sprays frequently 4. Saline irrigations of the nose can be very helpful if done frequently.  * 4X daily for 1 week*  * Use of a nettie pot can be helpful with this. Follow directions with this* 5. Drink plenty of fluids 6. Keep thermostat turn down  low 7.For any cough or congestion  Use plain Mucinex- regular strength or max strength is fine   * Children- consult with Pharmacist for dosing 8. For fever or aces or pains- take tylenol or ibuprofen appropriate for age and weight.  * for fevers greater than 101 orally you may alternate ibuprofen and tylenol every  3 hours.    Meds ordered this encounter  Medications  . azithromycin (ZITHROMAX) 500 MG tablet    Sig: As directed    Dispense:  6 tablet    Refill:  0    Order Specific Question:  Supervising Provider    Answer:  Chipper Herb [[id number]]    Mary-Margaret Hassell Done, FNP

## 2015-10-06 NOTE — Patient Instructions (Signed)

## 2015-10-06 NOTE — Telephone Encounter (Signed)
Appt Made for today.

## 2015-10-19 ENCOUNTER — Other Ambulatory Visit: Payer: Self-pay

## 2015-10-19 DIAGNOSIS — E785 Hyperlipidemia, unspecified: Secondary | ICD-10-CM

## 2015-10-19 MED ORDER — ATORVASTATIN CALCIUM 20 MG PO TABS: ORAL_TABLET | ORAL | Status: DC

## 2015-11-04 ENCOUNTER — Other Ambulatory Visit: Payer: Self-pay | Admitting: Nurse Practitioner

## 2015-11-04 DIAGNOSIS — E041 Nontoxic single thyroid nodule: Secondary | ICD-10-CM

## 2015-11-15 ENCOUNTER — Encounter: Payer: Self-pay | Admitting: Nurse Practitioner

## 2015-11-15 ENCOUNTER — Ambulatory Visit (INDEPENDENT_AMBULATORY_CARE_PROVIDER_SITE_OTHER): Payer: Commercial Managed Care - HMO | Admitting: Nurse Practitioner

## 2015-11-15 ENCOUNTER — Ambulatory Visit (HOSPITAL_COMMUNITY): Admission: RE | Admit: 2015-11-15 | Payer: Commercial Managed Care - HMO | Source: Ambulatory Visit

## 2015-11-15 VITALS — BP 134/88 | HR 79 | Temp 97.2°F | Ht 65.0 in | Wt 160.0 lb

## 2015-11-15 DIAGNOSIS — D509 Iron deficiency anemia, unspecified: Secondary | ICD-10-CM

## 2015-11-15 DIAGNOSIS — E119 Type 2 diabetes mellitus without complications: Secondary | ICD-10-CM | POA: Diagnosis not present

## 2015-11-15 DIAGNOSIS — Z6826 Body mass index (BMI) 26.0-26.9, adult: Secondary | ICD-10-CM | POA: Diagnosis not present

## 2015-11-15 DIAGNOSIS — I10 Essential (primary) hypertension: Secondary | ICD-10-CM

## 2015-11-15 DIAGNOSIS — I429 Cardiomyopathy, unspecified: Secondary | ICD-10-CM | POA: Diagnosis not present

## 2015-11-15 DIAGNOSIS — E785 Hyperlipidemia, unspecified: Secondary | ICD-10-CM

## 2015-11-15 DIAGNOSIS — I359 Nonrheumatic aortic valve disorder, unspecified: Secondary | ICD-10-CM | POA: Diagnosis not present

## 2015-11-15 LAB — POCT GLYCOSYLATED HEMOGLOBIN (HGB A1C): Hemoglobin A1C: 7.2

## 2015-11-15 NOTE — Patient Instructions (Signed)
Diabetes and Foot Care Diabetes may cause you to have problems because of poor blood supply (circulation) to your feet and legs. This may cause the skin on your feet to become thinner, break easier, and heal more slowly. Your skin may become dry, and the skin may peel and crack. You may also have nerve damage in your legs and feet causing decreased feeling in them. You may not notice minor injuries to your feet that could lead to infections or more serious problems. Taking care of your feet is one of the most important things you can do for yourself.  HOME CARE INSTRUCTIONS  Wear shoes at all times, even in the house. Do not go barefoot. Bare feet are easily injured.  Check your feet daily for blisters, cuts, and redness. If you cannot see the bottom of your feet, use a mirror or ask someone for help.  Wash your feet with warm water (do not use hot water) and mild soap. Then pat your feet and the areas between your toes until they are completely dry. Do not soak your feet as this can dry your skin.  Apply a moisturizing lotion or petroleum jelly (that does not contain alcohol and is unscented) to the skin on your feet and to dry, brittle toenails. Do not apply lotion between your toes.  Trim your toenails straight across. Do not dig under them or around the cuticle. File the edges of your nails with an emery board or nail file.  Do not cut corns or calluses or try to remove them with medicine.  Wear clean socks or stockings every day. Make sure they are not too tight. Do not wear knee-high stockings since they may decrease blood flow to your legs.  Wear shoes that fit properly and have enough cushioning. To break in new shoes, wear them for just a few hours a day. This prevents you from injuring your feet. Always look in your shoes before you put them on to be sure there are no objects inside.  Do not cross your legs. This may decrease the blood flow to your feet.  If you find a minor scrape,  cut, or break in the skin on your feet, keep it and the skin around it clean and dry. These areas may be cleansed with mild soap and water. Do not cleanse the area with peroxide, alcohol, or iodine.  When you remove an adhesive bandage, be sure not to damage the skin around it.  If you have a wound, look at it several times a day to make sure it is healing.  Do not use heating pads or hot water bottles. They may burn your skin. If you have lost feeling in your feet or legs, you may not know it is happening until it is too late.  Make sure your health care provider performs a complete foot exam at least annually or more often if you have foot problems. Report any cuts, sores, or bruises to your health care provider immediately. SEEK MEDICAL CARE IF:   You have an injury that is not healing.  You have cuts or breaks in the skin.  You have an ingrown nail.  You notice redness on your legs or feet.  You feel burning or tingling in your legs or feet.  You have pain or cramps in your legs and feet.  Your legs or feet are numb.  Your feet always feel cold. SEEK IMMEDIATE MEDICAL CARE IF:   There is increasing redness,   swelling, or pain in or around a wound.  There is a red line that goes up your leg.  Pus is coming from a wound.  You develop a fever or as directed by your health care provider.  You notice a bad smell coming from an ulcer or wound.   This information is not intended to replace advice given to you by your health care provider. Make sure you discuss any questions you have with your health care provider.   Document Released: 12/08/2000 Document Revised: 08/13/2013 Document Reviewed: 05/20/2013 Elsevier Interactive Patient Education 2016 [employer]  

## 2015-11-15 NOTE — Progress Notes (Signed)
Subjective:    Patient ID: Emily Wall, female    DOB: 04-Feb-1936, 79 y.o.   MRN: [id number]  Patient here today for follow up of chronic medical problems. No complaints today.   Diabetes She presents for her follow-up diabetic visit. She has type 2 diabetes mellitus. Pertinent negatives for hypoglycemia include no headaches. Pertinent negatives for diabetes include no chest pain, no polydipsia, no polyphagia, no polyuria, no visual change and no weakness. Risk factors for coronary artery disease include diabetes mellitus, dyslipidemia, hypertension and post-menopausal. Current diabetic treatment includes oral agent (dual therapy). She is compliant with treatment all of the time. Her weight is stable. She is following a diabetic diet. When asked about meal planning, she reported none. She has not had a previous visit with a dietitian. She never participates in exercise. Her breakfast blood glucose is taken between 8-9 am. Her breakfast blood glucose range is generally 130-140 mg/dl. Her overall blood glucose range is 130-140 mg/dl. An ACE inhibitor/angiotensin II receptor blocker is being taken. She does not see a podiatrist.Eye exam is not current.  Hypertension This is a chronic problem. The current episode started more than 1 year ago. Pertinent negatives include no chest pain, headaches, palpitations or shortness of breath. Risk factors for coronary artery disease include diabetes mellitus and dyslipidemia. Past treatments include diuretics. The current treatment provides moderate improvement.  Hyperlipidemia This is a chronic problem. The current episode started more than 1 year ago. The problem is controlled. Recent lipid tests were reviewed and are high. Pertinent negatives include no chest pain, myalgias or shortness of breath. Current antihyperlipidemic treatment includes statins. The current treatment provides mild improvement of lipids. There are no compliance problems.  Risk factors for  coronary artery disease include dyslipidemia, diabetes mellitus, hypertension and post-menopausal.  Peripheral edema Lasix daily- keeps swelling down. Anemia Ferrous sulfate daily  Review of Systems  Respiratory: Negative for shortness of breath.   Cardiovascular: Negative for chest pain and palpitations.  Endocrine: Negative for polydipsia, polyphagia and polyuria.  Musculoskeletal: Negative for myalgias.  Neurological: Negative for weakness and headaches.  All other systems reviewed and are negative.      Objective:   Physical Exam  Constitutional: She is oriented to person, place, and time. She appears well-developed and well-nourished.  HENT:  Head: Normocephalic.  Nose: Nose normal.  Mouth/Throat: Oropharynx is clear and moist.  Eyes: EOM are normal. Pupils are equal, round, and reactive to light.  Neck: Trachea normal, normal range of motion and full passive range of motion without pain. Neck supple. No JVD present. Carotid bruit is not present. No thyromegaly present.  Cardiovascular: Normal rate, regular rhythm and intact distal pulses.  Exam reveals no gallop and no friction rub.   Murmur (4/6 systolic murmur) heard. Bilateral carotid bruits  Pulmonary/Chest: Effort normal and breath sounds normal.  Abdominal: Soft. Bowel sounds are normal. She exhibits no distension and no mass. There is no tenderness.  Musculoskeletal: Normal range of motion.  Lymphadenopathy:    She has no cervical adenopathy.  Neurological: She is alert and oriented to person, place, and time. She has normal reflexes.  Skin: Skin is warm and dry.  Psychiatric: She has a normal mood and affect. Her behavior is normal. Judgment and thought content normal.   Results for orders placed or performed in visit on 11/15/15  POCT glycosylated hemoglobin (Hb A1C)  Result Value Ref Range   Hemoglobin A1C 7.2    BP 134/88 mmHg  Pulse 79  Temp(Src)  97.2 F (36.2 C) (Oral)  Ht _0  (1.651 m)  Wt 160 lb  (72.576 kg)  BMI 26.63 kg/m2        Assessment & Plan:   1. Type 2 diabetes mellitus without complication, without long-term current use of insulin (HCC) Continue to warch carbs in diet - POCT glycosylated hemoglobin (Hb A1C) - Microalbumin, urine  2. Hyperlipidemia Low fat diet - Lipid panel  3. Essential hypertension Do not add salt to diet - CMP14+EGFR  4. Aortic valve disorder Keep follow up appointments with cardiology  5. Secondary cardiomyopathy ([place])  6. Iron deficiency anemia Continue iron supplements daily  7. BMI 26.0-26.9,adult Discussed diet and exercise for person with BMI >25 Will recheck weight in 3-6 months     Labs pending Health maintenance reviewed Diet and exercise encouraged Continue all meds Follow up  In 3 month  [place], FNP

## 2015-11-16 LAB — CMP14+EGFR
A/G RATIO: 0.9 — AB (ref 1.1–2.5)
ALK PHOS: 58 IU/L (ref 39–117)
ALT: 11 IU/L (ref 0–32)
AST: 14 IU/L (ref 0–40)
Albumin: 3.2 g/dL — ABNORMAL LOW (ref 3.5–4.8)
BILIRUBIN TOTAL: 0.3 mg/dL (ref 0.0–1.2)
BUN/Creatinine Ratio: 14 (ref 11–26)
BUN: 9 mg/dL (ref 8–27)
CHLORIDE: 103 mmol/L (ref 97–106)
CO2: 26 mmol/L (ref 18–29)
Calcium: 8.6 mg/dL — ABNORMAL LOW (ref 8.7–10.3)
Creatinine, Ser: 0.64 mg/dL (ref 0.57–1.00)
GFR calc non Af Amer: 87 mL/min/{1.73_m2} (ref >59)
GFR, EST AFRICAN AMERICAN: 100 mL/min/{1.73_m2} (ref >59)
Globulin, Total: 3.4 g/dL (ref 1.5–4.5)
Glucose: 125 mg/dL — ABNORMAL HIGH (ref 65–99)
POTASSIUM: 3.9 mmol/L (ref 3.5–5.2)
Sodium: 142 mmol/L (ref 136–144)
Total Protein: 6.6 g/dL (ref 6.0–8.5)

## 2015-11-16 LAB — LIPID PANEL
CHOLESTEROL TOTAL: 126 mg/dL (ref 100–199)
Chol/HDL Ratio: 2.2 ratio units (ref 0.0–4.4)
HDL: 57 mg/dL (ref >39)
LDL Calculated: 59 mg/dL (ref 0–99)
Triglycerides: 48 mg/dL (ref 0–149)
VLDL Cholesterol Cal: 10 mg/dL (ref 5–40)

## 2015-11-16 LAB — MICROALBUMIN, URINE: Microalbumin, Urine: <3 ug/mL

## 2015-11-23 ENCOUNTER — Other Ambulatory Visit: Payer: Self-pay | Admitting: *Deleted

## 2015-11-23 DIAGNOSIS — E785 Hyperlipidemia, unspecified: Secondary | ICD-10-CM

## 2015-11-23 MED ORDER — ATORVASTATIN CALCIUM 20 MG PO TABS: ORAL_TABLET | ORAL | Status: DC

## 2015-11-26 ENCOUNTER — Telehealth: Payer: Self-pay | Admitting: Nurse Practitioner

## 2015-11-26 MED ORDER — TRIAMCINOLONE ACETONIDE 0.1 % EX CREA: 1.0000 "application " | TOPICAL_CREAM | Freq: Every day | CUTANEOUS | Status: DC

## 2015-11-26 NOTE — Telephone Encounter (Signed)
Triamcinolone rx sent to pharmacy 

## 2015-11-26 NOTE — Telephone Encounter (Signed)
Detailed message left that rx has been sent to pharmacy.  

## 2015-12-01 ENCOUNTER — Other Ambulatory Visit: Payer: Self-pay | Admitting: Nurse Practitioner

## 2016-01-07 ENCOUNTER — Telehealth: Payer: Self-pay | Admitting: Nurse Practitioner

## 2016-01-07 NOTE — Telephone Encounter (Signed)
Aware none available at this time

## 2016-01-13 ENCOUNTER — Telehealth: Payer: Self-pay | Admitting: Nurse Practitioner

## 2016-01-13 NOTE — Telephone Encounter (Signed)
Samples up front. Patient notified 

## 2016-02-05 ENCOUNTER — Other Ambulatory Visit: Payer: Self-pay | Admitting: Nurse Practitioner

## 2016-02-15 ENCOUNTER — Ambulatory Visit (INDEPENDENT_AMBULATORY_CARE_PROVIDER_SITE_OTHER): Payer: Commercial Managed Care - HMO | Admitting: Nurse Practitioner

## 2016-02-15 ENCOUNTER — Encounter: Payer: Self-pay | Admitting: Nurse Practitioner

## 2016-02-15 VITALS — BP 142/70 | HR 72 | Temp 97.1°F | Ht 65.0 in | Wt 156.4 lb

## 2016-02-15 DIAGNOSIS — E785 Hyperlipidemia, unspecified: Secondary | ICD-10-CM

## 2016-02-15 DIAGNOSIS — E119 Type 2 diabetes mellitus without complications: Secondary | ICD-10-CM

## 2016-02-15 DIAGNOSIS — Z6826 Body mass index (BMI) 26.0-26.9, adult: Secondary | ICD-10-CM | POA: Diagnosis not present

## 2016-02-15 DIAGNOSIS — D509 Iron deficiency anemia, unspecified: Secondary | ICD-10-CM | POA: Diagnosis not present

## 2016-02-15 DIAGNOSIS — R609 Edema, unspecified: Secondary | ICD-10-CM | POA: Diagnosis not present

## 2016-02-15 DIAGNOSIS — I429 Cardiomyopathy, unspecified: Secondary | ICD-10-CM | POA: Diagnosis not present

## 2016-02-15 DIAGNOSIS — I10 Essential (primary) hypertension: Secondary | ICD-10-CM

## 2016-02-15 LAB — POCT GLYCOSYLATED HEMOGLOBIN (HGB A1C): HEMOGLOBIN A1C: 6.7

## 2016-02-15 MED ORDER — FERROUS SULFATE 325 (65 FE) MG PO TABS: 325.0000 mg | ORAL_TABLET | Freq: Every day | ORAL | Status: DC

## 2016-02-15 MED ORDER — LISINOPRIL 40 MG PO TABS
40.0000 mg | ORAL_TABLET | Freq: Every day | ORAL | Status: DC
Start: 2016-02-15 — End: 2016-08-01

## 2016-02-15 MED ORDER — METFORMIN HCL 1000 MG PO TABS: 1000.0000 mg | ORAL_TABLET | Freq: Two times a day (BID) | ORAL | Status: DC

## 2016-02-15 MED ORDER — ATORVASTATIN CALCIUM 20 MG PO TABS
ORAL_TABLET | ORAL | Status: DC
Start: 2016-02-15 — End: 2016-08-28

## 2016-02-15 MED ORDER — CARVEDILOL 25 MG PO TABS: 25.0000 mg | ORAL_TABLET | Freq: Two times a day (BID) | ORAL | Status: DC

## 2016-02-15 MED ORDER — TRIAMCINOLONE ACETONIDE 0.1 % EX CREA: TOPICAL_CREAM | CUTANEOUS | Status: DC

## 2016-02-15 MED ORDER — FUROSEMIDE 20 MG PO TABS: 20.0000 mg | ORAL_TABLET | Freq: Every day | ORAL | Status: DC | PRN

## 2016-02-15 NOTE — Patient Instructions (Signed)
Diabetes and Foot Care Diabetes may cause you to have problems because of poor blood supply (circulation) to your feet and legs. This may cause the skin on your feet to become thinner, break easier, and heal more slowly. Your skin may become dry, and the skin may peel and crack. You may also have nerve damage in your legs and feet causing decreased feeling in them. You may not notice minor injuries to your feet that could lead to infections or more serious problems. Taking care of your feet is one of the most important things you can do for yourself.  HOME CARE INSTRUCTIONS  Wear shoes at all times, even in the house. Do not go barefoot. Bare feet are easily injured.  Check your feet daily for blisters, cuts, and redness. If you cannot see the bottom of your feet, use a mirror or ask someone for help.  Wash your feet with warm water (do not use hot water) and mild soap. Then pat your feet and the areas between your toes until they are completely dry. Do not soak your feet as this can dry your skin.  Apply a moisturizing lotion or petroleum jelly (that does not contain alcohol and is unscented) to the skin on your feet and to dry, brittle toenails. Do not apply lotion between your toes.  Trim your toenails straight across. Do not dig under them or around the cuticle. File the edges of your nails with an emery board or nail file.  Do not cut corns or calluses or try to remove them with medicine.  Wear clean socks or stockings every day. Make sure they are not too tight. Do not wear knee-high stockings since they may decrease blood flow to your legs.  Wear shoes that fit properly and have enough cushioning. To break in new shoes, wear them for just a few hours a day. This prevents you from injuring your feet. Always look in your shoes before you put them on to be sure there are no objects inside.  Do not cross your legs. This may decrease the blood flow to your feet.  If you find a minor scrape,  cut, or break in the skin on your feet, keep it and the skin around it clean and dry. These areas may be cleansed with mild soap and water. Do not cleanse the area with peroxide, alcohol, or iodine.  When you remove an adhesive bandage, be sure not to damage the skin around it.  If you have a wound, look at it several times a day to make sure it is healing.  Do not use heating pads or hot water bottles. They may burn your skin. If you have lost feeling in your feet or legs, you may not know it is happening until it is too late.  Make sure your health care provider performs a complete foot exam at least annually or more often if you have foot problems. Report any cuts, sores, or bruises to your health care provider immediately. SEEK MEDICAL CARE IF:   You have an injury that is not healing.  You have cuts or breaks in the skin.  You have an ingrown nail.  You notice redness on your legs or feet.  You feel burning or tingling in your legs or feet.  You have pain or cramps in your legs and feet.  Your legs or feet are numb.  Your feet always feel cold. SEEK IMMEDIATE MEDICAL CARE IF:   There is increasing redness,   swelling, or pain in or around a wound.  There is a red line that goes up your leg.  Pus is coming from a wound.  You develop a fever or as directed by your health care provider.  You notice a bad smell coming from an ulcer or wound.   This information is not intended to replace advice given to you by your health care provider. Make sure you discuss any questions you have with your health care provider.   Document Released: 12/08/2000 Document Revised: 08/13/2013 Document Reviewed: 05/20/2013 Elsevier Interactive Patient Education 2016 [employer]  

## 2016-02-15 NOTE — Progress Notes (Signed)
Subjective:    Patient ID: Emily Wall, female    DOB: 1935/12/28, 80 y.o.   MRN: [id number]  Patient here today for follow up of chronic medical problems.  Outpatient Encounter Prescriptions as of 02/15/2016  Medication Sig  . alendronate (FOSAMAX) 70 MG tablet TAKE 1 TABLET BY MOUTH ONCE A WEEK WITH A FULL GLASS OF WATER ON AN EM  PTY STOMACH  . aspirin 325 MG tablet Take 325 mg by mouth daily.    Marland Kitchen atorvastatin (LIPITOR) 20 MG tablet TAKE ONE TABLET BY MOUTH ONE  TIME DAILY  . Calcium Carbonate-Vitamin D (CALCIUM + D PO) Take 1 tablet by mouth daily.    . carvedilol (COREG) 25 MG tablet Take 1 tablet (25 mg total) by mouth 2 (two) times daily with a meal.  . cholecalciferol (VITAMIN D) 1000 UNITS tablet Take 1,000 Units by mouth daily.   . ferrous sulfate 325 (65 FE) MG tablet Take 1 tablet (325 mg total) by mouth daily with breakfast.  . furosemide (LASIX) 20 MG tablet TAKE ONE TABLET BY MOUTH DAILY AS NEEDED  . glucose blood test strip   . LANCETS ULTRA THIN MISC   . lisinopril (PRINIVIL,ZESTRIL) 40 MG tablet Take 1 tablet (40 mg total) by mouth daily.  . metFORMIN (GLUCOPHAGE) 1000 MG tablet Take 1 tablet (1,000 mg total) by mouth 2 (two) times daily with a meal.  . sitaGLIPtin (JANUVIA) 100 MG tablet TAKE ONE TABLET BY MOUTH ONE  TIME DAILY  . triamcinolone cream (KENALOG) 0.1 % APPLY TOPICALLY ONCE DAILY   No facility-administered encounter medications on file as of 02/15/2016.     Diabetes She presents for her follow-up diabetic visit. She has type 2 diabetes mellitus. Pertinent negatives for hypoglycemia include no headaches. Pertinent negatives for diabetes include no chest pain, no polydipsia, no polyphagia, no polyuria, no visual change and no weakness. Risk factors for coronary artery disease include diabetes mellitus, dyslipidemia, hypertension and post-menopausal. Current diabetic treatment includes oral agent (dual therapy). She is compliant with treatment all of the  time. Her weight is stable. She is following a diabetic diet. When asked about meal planning, she reported none. She has not had a previous visit with a dietitian. She never participates in exercise. Her breakfast blood glucose is taken between 8-9 am. Her breakfast blood glucose range is generally 130-140 mg/dl. Her overall blood glucose range is 130-140 mg/dl. An ACE inhibitor/angiotensin II receptor blocker is being taken. She does not see a podiatrist.Eye exam is not current.  Hypertension This is a chronic problem. The current episode started more than 1 year ago. Pertinent negatives include no chest pain, headaches, palpitations or shortness of breath. Risk factors for coronary artery disease include diabetes mellitus and dyslipidemia. Past treatments include diuretics. The current treatment provides moderate improvement.  Hyperlipidemia This is a chronic problem. The current episode started more than 1 year ago. The problem is controlled. Recent lipid tests were reviewed and are high. Pertinent negatives include no chest pain, myalgias or shortness of breath. Current antihyperlipidemic treatment includes statins. The current treatment provides mild improvement of lipids. There are no compliance problems.  Risk factors for coronary artery disease include dyslipidemia, diabetes mellitus, hypertension and post-menopausal.  Peripheral edema Lasix daily- keeps swelling down. Anemia Ferrous sulfate daily Secondary cardiomyopathy Sees cardiologist every 6 months- no c/o fatigue or SOB- has cardiology appointment in april   Review of Systems  Constitutional: Negative.   Respiratory: Negative for shortness of breath.   Cardiovascular: Negative  for chest pain and palpitations.  Endocrine: Negative for polydipsia, polyphagia and polyuria.  Musculoskeletal: Negative for myalgias.  Neurological: Negative for weakness and headaches.  Psychiatric/Behavioral: Negative.   All other systems reviewed and  are negative.      Objective:   Physical Exam  Constitutional: She is oriented to person, place, and time. She appears well-developed and well-nourished.  HENT:  Head: Normocephalic.  Nose: Nose normal.  Mouth/Throat: Oropharynx is clear and moist.  Eyes: EOM are normal. Pupils are equal, round, and reactive to light.  Neck: Trachea normal, normal range of motion and full passive range of motion without pain. Neck supple. No JVD present. Carotid bruit is not present. No thyromegaly present.  Cardiovascular: Normal rate, regular rhythm and intact distal pulses.  Exam reveals no gallop and no friction rub.   Murmur (4/6 systolic murmur) heard. Bilateral carotid bruits  Pulmonary/Chest: Effort normal and breath sounds normal.  Abdominal: Soft. Bowel sounds are normal. She exhibits no distension and no mass. There is no tenderness.  Musculoskeletal: Normal range of motion.  Lymphadenopathy:    She has no cervical adenopathy.  Neurological: She is alert and oriented to person, place, and time. She has normal reflexes.  Skin: Skin is warm and dry.  Psychiatric: She has a normal mood and affect. Her behavior is normal. Judgment and thought content normal.    BP 142/70 mmHg  Pulse 72  Temp(Src) 97.1 F (36.2 C) (Oral)  Ht 5' 5"  (1.651 m)  Wt 156 lb 6.4 oz (70.943 kg)  BMI 26.03 kg/m2  Results for orders placed or performed in visit on 02/15/16  POCT glycosylated hemoglobin (Hb A1C)  Result Value Ref Range   Hemoglobin A1C 6.7          Assessment & Plan:   1. Essential hypertension Do not add salt to diet - CMP14+EGFR - carvedilol (COREG) 25 MG tablet; Take 1 tablet (25 mg total) by mouth 2 (two) times daily with a meal.  Dispense: 60 tablet; Refill: 5 - lisinopril (PRINIVIL,ZESTRIL) 40 MG tablet; Take 1 tablet (40 mg total) by mouth daily.  Dispense: 30 tablet; Refill: 5  2. Secondary cardiomyopathy ([place]) Keep follow up appointment with cardiologist  3. Type 2 diabetes  mellitus without complication, without long-term current use of insulin (HCC) Continue to watch carbs in diet - POCT glycosylated hemoglobin (Hb A1C) - metFORMIN (GLUCOPHAGE) 1000 MG tablet; Take 1 tablet (1,000 mg total) by mouth 2 (two) times daily with a meal.  Dispense: 60 tablet; Refill: 5  4. Iron deficiency anemia Continue iron supplements - ferrous sulfate 325 (65 FE) MG tablet; Take 1 tablet (325 mg total) by mouth daily with breakfast.  Dispense: 30 tablet; Refill: 5   5. Hyperlipidemia Low fta diet - Lipid panel - atorvastatin (LIPITOR) 20 MG tablet; TAKE ONE TABLET BY MOUTH ONE  TIME DAILY  Dispense: 90 tablet; Refill: 1  6. BMI 26.0-26.9,adult Discussed diet and exercise for person with BMI >25 Will recheck weight in 3-6 months   7. Peripheral edema elevate legs when sitting - furosemide (LASIX) 20 MG tablet; Take 1 tablet (20 mg total) by mouth daily as needed.  Dispense: 30 tablet; Refill: 5    Labs pending Health maintenance reviewed Diet and exercise encouraged Continue all meds Follow up  In 3 month   Holly Hill, FNP

## 2016-02-16 ENCOUNTER — Other Ambulatory Visit: Payer: Self-pay | Admitting: Nurse Practitioner

## 2016-02-16 LAB — CMP14+EGFR
A/G RATIO: 1.3 (ref 1.1–2.5)
ALT: 7 IU/L (ref 0–32)
AST: 14 IU/L (ref 0–40)
Albumin: 3.9 g/dL (ref 3.5–4.8)
Alkaline Phosphatase: 70 IU/L (ref 39–117)
BILIRUBIN TOTAL: 0.3 mg/dL (ref 0.0–1.2)
BUN/Creatinine Ratio: 14 (ref 11–26)
BUN: 11 mg/dL (ref 8–27)
CALCIUM: 9.3 mg/dL (ref 8.7–10.3)
CHLORIDE: 95 mmol/L — AB (ref 96–106)
CO2: 27 mmol/L (ref 18–29)
Creatinine, Ser: 0.81 mg/dL (ref 0.57–1.00)
GFR calc Af Amer: 81 mL/min/{1.73_m2} (ref >59)
GFR calc non Af Amer: 70 mL/min/{1.73_m2} (ref >59)
Globulin, Total: 3 g/dL (ref 1.5–4.5)
Glucose: 120 mg/dL — ABNORMAL HIGH (ref 65–99)
POTASSIUM: 3.9 mmol/L (ref 3.5–5.2)
Sodium: 135 mmol/L (ref 134–144)
Total Protein: 6.9 g/dL (ref 6.0–8.5)

## 2016-02-16 LAB — LIPID PANEL
Chol/HDL Ratio: 2.1 ratio units (ref 0.0–4.4)
Cholesterol, Total: 122 mg/dL (ref 100–199)
HDL: 57 mg/dL (ref >39)
LDL Calculated: 55 mg/dL (ref 0–99)
TRIGLYCERIDES: 52 mg/dL (ref 0–149)
VLDL Cholesterol Cal: 10 mg/dL (ref 5–40)

## 2016-03-07 ENCOUNTER — Other Ambulatory Visit: Payer: Self-pay | Admitting: Nurse Practitioner

## 2016-03-09 DIAGNOSIS — H5210 Myopia, unspecified eye: Secondary | ICD-10-CM | POA: Diagnosis not present

## 2016-03-09 DIAGNOSIS — Z0100 Encounter for examination of eyes and vision without abnormal findings: Secondary | ICD-10-CM | POA: Diagnosis not present

## 2016-03-09 DIAGNOSIS — E119 Type 2 diabetes mellitus without complications: Secondary | ICD-10-CM | POA: Diagnosis not present

## 2016-03-09 DIAGNOSIS — H5200 Hypermetropia, unspecified eye: Secondary | ICD-10-CM | POA: Diagnosis not present

## 2016-03-09 LAB — HM DIABETES EYE EXAM

## 2016-04-03 ENCOUNTER — Encounter: Payer: Self-pay | Admitting: Cardiology

## 2016-04-03 ENCOUNTER — Ambulatory Visit (INDEPENDENT_AMBULATORY_CARE_PROVIDER_SITE_OTHER): Payer: Commercial Managed Care - HMO | Admitting: Cardiology

## 2016-04-03 VITALS — BP 135/75 | HR 76 | Ht 65.0 in | Wt 160.6 lb

## 2016-04-03 DIAGNOSIS — I350 Nonrheumatic aortic (valve) stenosis: Secondary | ICD-10-CM

## 2016-04-03 DIAGNOSIS — I10 Essential (primary) hypertension: Secondary | ICD-10-CM | POA: Diagnosis not present

## 2016-04-03 DIAGNOSIS — I429 Cardiomyopathy, unspecified: Secondary | ICD-10-CM

## 2016-04-03 DIAGNOSIS — E785 Hyperlipidemia, unspecified: Secondary | ICD-10-CM

## 2016-04-03 DIAGNOSIS — I2510 Atherosclerotic heart disease of native coronary artery without angina pectoris: Secondary | ICD-10-CM | POA: Diagnosis not present

## 2016-04-03 NOTE — Patient Instructions (Signed)
Your physician recommends that you continue on your current medications as directed. Please refer to the Current Medication list given to you today. Your physician has requested that you have an echocardiogram. Echocardiography is a painless test that uses sound waves to create images of your heart. It provides your doctor with information about the size and shape of your heart and how well your heart's chambers and valves are working. This procedure takes approximately one hour. There are no restrictions for this procedure. Your physician recommends that you schedule a follow-up appointment in: 1 year. You will receive a reminder letter in the mail in about 10 months reminding you to call and schedule your appointment. If you don't receive this letter, please contact our office. 

## 2016-04-03 NOTE — Progress Notes (Signed)
Cardiology Office Note  Date: 04/03/2016   ID: Emily Wall, DOB 12-Aug-1936, MRN [id number]  PCP: Chevis Pretty, FNP  Primary Cardiologist: Rozann Lesches, MD   Chief Complaint  Patient presents with  . Aortic valve disease  . History of cardiomyopathy    History of Present Illness: Emily Wall is a 80 y.o. female last seen in April 2016. She presents today for a follow-up visit. Reports NYHA class II dyspnea, no exertional angina. No orthopnea or PND. I reviewed her ECG today which shows sinus rhythm with occasional PAC, borderline increased voltage, nonspecific T-wave changes.  Echocardiogram from last year is reviewed below. She had evidence of moderate aortic valve stenosis at that time, LVEF was 45%. We discussed the results today and plan for a follow-up study this year.  We went over her medications which are outlined below. Cardiac regimen includes aspirin, Coreg, lisinopril, Lipitor, and Lasix.  Most recent lipid numbers are reviewed below, LDL well controlled 55.  Past Medical History  Diagnosis Date  . Aortic stenosis     Moderate  . Aortic regurgitation     Mild  . Nonischemic cardiomyopathy (HCC)     LVEF improved to 50-55%  . Type 2 diabetes mellitus ([place])   . Coronary atherosclerosis of native coronary artery     Nonobstructive 2007  . Essential hypertension, benign   . Osteoporosis   . Hypothyroidism   . Hyperlipidemia     Past Surgical History  Procedure Laterality Date  . Skin graft      Current Outpatient Prescriptions  Medication Sig Dispense Refill  . alendronate (FOSAMAX) 70 MG tablet TAKE 1 TABLET BY MOUTH ONCE A WEEK WITH A FULL GLASS OF WATER ON AN EM  PTY STOMACH 4 tablet 5  . aspirin 325 MG tablet Take 325 mg by mouth daily.      Marland Kitchen atorvastatin (LIPITOR) 20 MG tablet TAKE ONE TABLET BY MOUTH ONE  TIME DAILY 90 tablet 1  . Calcium Carbonate-Vitamin D (CALCIUM + D PO) Take 1 tablet by mouth daily.      . carvedilol  (COREG) 25 MG tablet Take 1 tablet (25 mg total) by mouth 2 (two) times daily with a meal. 60 tablet 5  . cholecalciferol (VITAMIN D) 1000 UNITS tablet Take 1,000 Units by mouth daily.     . ferrous sulfate 325 (65 FE) MG tablet Take 1 tablet (325 mg total) by mouth daily with breakfast. 30 tablet 5  . furosemide (LASIX) 20 MG tablet Take 1 tablet (20 mg total) by mouth daily as needed. 30 tablet 5  . glucose blood test strip     . JANUVIA 100 MG tablet TAKE ONE TABLET BY MOUTH ONE TIME DAILY 60 tablet 4  . LANCETS ULTRA THIN MISC     . lisinopril (PRINIVIL,ZESTRIL) 40 MG tablet Take 1 tablet (40 mg total) by mouth daily. 30 tablet 5  . metFORMIN (GLUCOPHAGE) 1000 MG tablet Take 1 tablet (1,000 mg total) by mouth 2 (two) times daily with a meal. 60 tablet 5  . triamcinolone cream (KENALOG) 0.1 % APPLY TOPICALLY ONCE DAILY 453 g 1   No current facility-administered medications for this visit.   Allergies:  Review of patient's allergies indicates no known allergies.   Social History: The patient  reports that she quit smoking about 27 years ago. Her smoking use included Cigarettes. She has a 15 pack-year smoking history. She has never used smokeless tobacco. She reports that she does not drink alcohol  or use illicit drugs.   ROS:  Please see the history of present illness. Otherwise, complete review of systems is positive for mild arthritic symptoms.  All other systems are reviewed and negative.   Physical Exam: VS:  BP 135/75 mmHg  Pulse 76  Ht 5\' 5"  (1.651 m)  Wt 160 lb 9.6 oz (72.848 kg)  BMI 26.73 kg/m2  SpO2 100%, BMI Body mass index is 26.73 kg/(m^2).  Wt Readings from Last 3 Encounters:  04/03/16 160 lb 9.6 oz (72.848 kg)  02/15/16 156 lb 6.4 oz (70.943 kg)  11/15/15 160 lb (72.576 kg)    Overweight woman in no acute distress.  HEENT: Conjunctiva and lids normal, oropharynx with moist mucosa.  Neck: Supple, no elevated JVP or bruits.  Lungs: Clear to auscultation,  nonlabored.  Cardiac: Regular rate and rhythm, 3/6 systolic murmur heard at the base, second heart sound preserved, no S3 gallop or pericardial rub, no loud diastolic murmur.  Abdomen: Soft, nontender, bowel sounds present.  Extremities: No pitting edema, distal pulses full.   ECG: I personally reviewed the prior tracing from 03/30/2015 which showed sinus rhythm with increased voltage and nonspecific ST-T changes.  Recent Labwork: 02/15/2016: ALT 7; AST 14; BUN 11; Creatinine, Ser 0.81; Potassium 3.9; Sodium 135     Component Value Date/Time   CHOL 122 02/15/2016 1027   CHOL 116 02/01/2015 0913   CHOL 119 06/02/2013 1318   TRIG 52 02/15/2016 1027   TRIG 50 02/01/2015 0913   TRIG 41 06/02/2013 1318   HDL 57 02/15/2016 1027   HDL 53 02/01/2015 0913   HDL 52 06/02/2013 1318   CHOLHDL 2.1 02/15/2016 1027   LDLCALC 55 02/15/2016 1027   LDLCALC 49 06/22/2014 1630   LDLCALC 59 06/02/2013 1318    Other Studies Reviewed Today:  Echocardiogram 04/15/2015: Study Conclusions  - Left ventricle: Technically difficult study. Hypokinesis of  base/mid inferolateral segments and base inferior segment. The EF  is 45% I think that wall motion may have been abnormal at the  time of last study. The cavity size was trivially increased. Wall  thickness was increased in a pattern of mild LVH. Doppler  parameters are consistent with abnormal left ventricular  relaxation (grade 1 diastolic dysfunction). - Aortic valve: Calcified valve. Gradients remain similar to prior  study. There is moderate AS considering all factors. There was  trivial regurgitation. Mean gradient (S): 19 mm Hg. Peak gradient  (S): 39 mm Hg. VTI ratio of LVOT to aortic valve: 0.32. - Mitral valve: Mildly calcified annulus. - Left atrium: The atrium was mildly dilated. - Right ventricle: The cavity size was normal. Systolic function  was normal. - Right atrium: The atrium was mildly dilated.  Assessment and  Plan:  1. Moderate aortic stenosis based on echocardiogram from last year. No significant change on examination or in stamina. We will plan a follow-up study for this year. Continue observation for now.  2. History of cardiomyopathy, last LVEF approximately 45%. She continues on Coreg and lisinopril. Plan follow-up LVEF with repeat echocardiogram.  3. Nonobstructive CAD based on previous workup, no active angina symptoms. Continue medical therapy which includes aspirin, beta blocker, and statin. ECG reviewed.  4. Essential hypertension, blood pressure is adequately controlled.  5. Hyperlipidemia, LDL 55 on Lipitor.  Current medicines were reviewed with the patient today.   Orders Placed This Encounter  Procedures  . EKG 12-Lead  . ECHOCARDIOGRAM COMPLETE    Disposition: FU with me in 1 year.  Signed, Satira Sark, MD, [facility] 04/03/2016 10:47 AM    [facility] [address], [place], [place] [address] Phone: [phone]; Fax: [phone]

## 2016-04-06 ENCOUNTER — Ambulatory Visit (INDEPENDENT_AMBULATORY_CARE_PROVIDER_SITE_OTHER): Payer: Commercial Managed Care - HMO

## 2016-04-06 ENCOUNTER — Other Ambulatory Visit: Payer: Self-pay

## 2016-04-06 DIAGNOSIS — I350 Nonrheumatic aortic (valve) stenosis: Secondary | ICD-10-CM

## 2016-04-11 ENCOUNTER — Telehealth: Payer: Self-pay | Admitting: *Deleted

## 2016-04-11 DIAGNOSIS — I359 Nonrheumatic aortic valve disorder, unspecified: Secondary | ICD-10-CM

## 2016-04-11 NOTE — Telephone Encounter (Signed)
LVEF is stable at 45%. Aortic stenosis is in the moderate to severe range. There has been some increase compared to previous study, so I would recommend a follow-up echocardiogram in 6 months instead of a year.

## 2016-04-17 NOTE — Telephone Encounter (Signed)
Patient informed and verbalized understanding of plan. 

## 2016-05-02 ENCOUNTER — Other Ambulatory Visit: Payer: Self-pay | Admitting: Nurse Practitioner

## 2016-06-05 ENCOUNTER — Other Ambulatory Visit: Payer: Self-pay | Admitting: Nurse Practitioner

## 2016-08-01 ENCOUNTER — Other Ambulatory Visit: Payer: Self-pay | Admitting: Nurse Practitioner

## 2016-08-01 DIAGNOSIS — I10 Essential (primary) hypertension: Secondary | ICD-10-CM

## 2016-08-01 DIAGNOSIS — R609 Edema, unspecified: Secondary | ICD-10-CM

## 2016-08-15 ENCOUNTER — Other Ambulatory Visit: Payer: Self-pay | Admitting: Nurse Practitioner

## 2016-08-15 DIAGNOSIS — I10 Essential (primary) hypertension: Secondary | ICD-10-CM

## 2016-08-28 ENCOUNTER — Other Ambulatory Visit: Payer: Self-pay | Admitting: Nurse Practitioner

## 2016-08-28 DIAGNOSIS — E785 Hyperlipidemia, unspecified: Secondary | ICD-10-CM

## 2016-08-29 NOTE — Telephone Encounter (Signed)
Patient NTBS for follow up and lab work  

## 2016-08-30 ENCOUNTER — Other Ambulatory Visit: Payer: Self-pay | Admitting: Nurse Practitioner

## 2016-08-30 DIAGNOSIS — E119 Type 2 diabetes mellitus without complications: Secondary | ICD-10-CM

## 2016-08-30 DIAGNOSIS — I10 Essential (primary) hypertension: Secondary | ICD-10-CM

## 2016-08-30 NOTE — Telephone Encounter (Signed)
Authorize 30 days only. Then contact the patient letting them know that they will need an appointment before any further prescriptions can be sent in. 

## 2016-08-30 NOTE — Telephone Encounter (Signed)
Pt aware refill sent to pharmacy & appt set up for first of Oct

## 2016-09-03 ENCOUNTER — Other Ambulatory Visit: Payer: Self-pay | Admitting: Nurse Practitioner

## 2016-09-03 DIAGNOSIS — R609 Edema, unspecified: Secondary | ICD-10-CM

## 2016-09-06 ENCOUNTER — Encounter: Payer: Self-pay | Admitting: Gastroenterology

## 2016-09-11 ENCOUNTER — Other Ambulatory Visit: Payer: Self-pay | Admitting: Nurse Practitioner

## 2016-09-11 DIAGNOSIS — I10 Essential (primary) hypertension: Secondary | ICD-10-CM

## 2016-09-25 ENCOUNTER — Ambulatory Visit (INDEPENDENT_AMBULATORY_CARE_PROVIDER_SITE_OTHER): Payer: Commercial Managed Care - HMO | Admitting: Nurse Practitioner

## 2016-09-25 ENCOUNTER — Encounter: Payer: Self-pay | Admitting: Nurse Practitioner

## 2016-09-25 VITALS — BP 142/76 | HR 80 | Temp 97.2°F | Ht 65.0 in | Wt 161.0 lb

## 2016-09-25 DIAGNOSIS — I10 Essential (primary) hypertension: Secondary | ICD-10-CM

## 2016-09-25 DIAGNOSIS — I359 Nonrheumatic aortic valve disorder, unspecified: Secondary | ICD-10-CM | POA: Diagnosis not present

## 2016-09-25 DIAGNOSIS — I429 Cardiomyopathy, unspecified: Secondary | ICD-10-CM

## 2016-09-25 DIAGNOSIS — E785 Hyperlipidemia, unspecified: Secondary | ICD-10-CM | POA: Diagnosis not present

## 2016-09-25 DIAGNOSIS — R609 Edema, unspecified: Secondary | ICD-10-CM | POA: Diagnosis not present

## 2016-09-25 DIAGNOSIS — D509 Iron deficiency anemia, unspecified: Secondary | ICD-10-CM | POA: Diagnosis not present

## 2016-09-25 DIAGNOSIS — Z6826 Body mass index (BMI) 26.0-26.9, adult: Secondary | ICD-10-CM

## 2016-09-25 DIAGNOSIS — E119 Type 2 diabetes mellitus without complications: Secondary | ICD-10-CM

## 2016-09-25 DIAGNOSIS — M810 Age-related osteoporosis without current pathological fracture: Secondary | ICD-10-CM

## 2016-09-25 LAB — BAYER DCA HB A1C WAIVED: HB A1C (BAYER DCA - WAIVED): 6.9 % (ref <7.0)

## 2016-09-25 MED ORDER — SITAGLIPTIN PHOSPHATE 100 MG PO TABS: 100.0000 mg | ORAL_TABLET | Freq: Every day | ORAL | 5 refills | Status: DC

## 2016-09-25 MED ORDER — ATORVASTATIN CALCIUM 20 MG PO TABS: 20.0000 mg | ORAL_TABLET | Freq: Every day | ORAL | 1 refills | Status: DC

## 2016-09-25 MED ORDER — FUROSEMIDE 20 MG PO TABS: 20.0000 mg | ORAL_TABLET | Freq: Every day | ORAL | 5 refills | Status: DC | PRN

## 2016-09-25 MED ORDER — CARVEDILOL 25 MG PO TABS: 25.0000 mg | ORAL_TABLET | Freq: Two times a day (BID) | ORAL | 5 refills | Status: DC

## 2016-09-25 MED ORDER — FERROUS SULFATE 325 (65 FE) MG PO TABS: 325.0000 mg | ORAL_TABLET | Freq: Every day | ORAL | 5 refills | Status: DC

## 2016-09-25 MED ORDER — TRIAMCINOLONE ACETONIDE 0.1 % EX CREA: TOPICAL_CREAM | Freq: Two times a day (BID) | CUTANEOUS | 1 refills | Status: DC

## 2016-09-25 MED ORDER — LISINOPRIL 40 MG PO TABS: 40.0000 mg | ORAL_TABLET | Freq: Every day | ORAL | 5 refills | Status: DC

## 2016-09-25 NOTE — Patient Instructions (Signed)
Bone Health Bones protect organs, store calcium, and anchor muscles. Good health habits, such as eating nutritious foods and exercising regularly, are important for maintaining healthy bones. They can also help to prevent a condition that causes bones to lose density and become weak and brittle (osteoporosis). WHY IS BONE MASS IMPORTANT? Bone mass refers to the amount of bone tissue that you have. The higher your bone mass, the stronger your bones. An important step toward having healthy bones throughout life is to have strong and dense bones during childhood. A young adult who has a high bone mass is more likely to have a high bone mass later in life. Bone mass at its greatest it is called peak bone mass. A large decline in bone mass occurs in older adults. In women, it occurs about the time of menopause. During this time, it is important to practice good health habits, because if more bone is lost than what is replaced, the bones will become less healthy and more likely to break (fracture). If you find that you have a low bone mass, you may be able to prevent osteoporosis or further bone loss by changing your diet and lifestyle. HOW CAN I FIND OUT IF MY BONE MASS IS LOW? Bone mass can be measured with an X-ray test that is called a bone mineral density (BMD) test. This test is recommended for all women who are age 65 or older. It may also be recommended for men who are age 70 or older, or for people who are more likely to develop osteoporosis due to:  Having bones that break easily.  Having a long-term disease that weakens bones, such as kidney disease or rheumatoid arthritis.  Having menopause earlier than normal.  Taking medicine that weakens bones, such as steroids, thyroid hormones, or hormone treatment for breast cancer or prostate cancer.  Smoking.  Drinking three or more alcoholic drinks each day. WHAT ARE THE NUTRITIONAL RECOMMENDATIONS FOR HEALTHY BONES? To have healthy bones, you need  to get enough of the right minerals and vitamins. Most nutrition experts recommend getting these nutrients from the foods that you eat. Nutritional recommendations vary from person to person. Ask your health care provider what is healthy for you. Here are some general guidelines. Calcium Recommendations Calcium is the most important (essential) mineral for bone health. Most people can get enough calcium from their diet, but supplements may be recommended for people who are at risk for osteoporosis. Good sources of calcium include:  Dairy products, such as low-fat or nonfat milk, cheese, and yogurt.  Dark green leafy vegetables, such as bok choy and broccoli.  Calcium-fortified foods, such as orange juice, cereal, bread, soy beverages, and tofu products.  Nuts, such as almonds. Follow these recommended amounts for daily calcium intake:  Children, age 1-3: 700 mg.  Children, age 4-8: 1,000 mg.  Children, age 9-13: 1,300 mg.  Teens, age 14-18: 1,300 mg.  Adults, age 19-50: 1,000 mg.  Adults, age 51-70:  Men: 1,000 mg.  Women: 1,200 mg.  Adults, age 71 or older: 1,200 mg.  Pregnant and breastfeeding females:  Teens: 1,300 mg.  Adults: 1,000 mg. Vitamin D Recommendations Vitamin D is the most essential vitamin for bone health. It helps the body to absorb calcium. Sunlight stimulates the skin to make vitamin D, so be sure to get enough sunlight. If you live in a cold climate or you do not get outside often, your health care provider may recommend that you take vitamin D supplements. Good   sources of vitamin D in your diet include:  Egg yolks.  Saltwater fish.  Milk and cereal fortified with vitamin D. Follow these recommended amounts for daily vitamin D intake:  Children and teens, age 1-18: 600 international units.  Adults, age 50 or younger: 400-800 international units.  Adults, age 51 or older: 800-1,000 international units. Other Nutrients Other nutrients for bone  health include:  Phosphorus. This mineral is found in meat, poultry, dairy foods, nuts, and legumes. The recommended daily intake for adult men and adult women is 700 mg.  Magnesium. This mineral is found in seeds, nuts, dark green vegetables, and legumes. The recommended daily intake for adult men is 400-420 mg. For adult women, it is 310-320 mg.  Vitamin K. This vitamin is found in green leafy vegetables. The recommended daily intake is 120 mg for adult men and 90 mg for adult women. WHAT TYPE OF PHYSICAL ACTIVITY IS BEST FOR BUILDING AND MAINTAINING HEALTHY BONES? Weight-bearing and strength-building activities are important for building and maintaining peak bone mass. Weight-bearing activities cause muscles and bones to work against gravity. Strength-building activities increases muscle strength that supports bones. Weight-bearing and muscle-building activities include:  Walking and hiking.  Jogging and running.  Dancing.  Gym exercises.  Lifting weights.  Tennis and racquetball.  Climbing stairs.  Aerobics. Adults should get at least 30 minutes of moderate physical activity on most days. Children should get at least 60 minutes of moderate physical activity on most days. Ask your health care provide what type of exercise is best for you. WHERE CAN I FIND MORE INFORMATION? For more information, check out the following websites:  National Osteoporosis Foundation: http://nof.org/learn/basics  National Institutes of Health: http://www.niams.nih.gov/Health_Info/Bone/Bone_Health/bone_health_for_life.asp   This information is not intended to replace advice given to you by your health care provider. Make sure you discuss any questions you have with your health care provider.   Document Released: 03/02/2004 Document Revised: 04/27/2015 Document Reviewed: 12/16/2014 [employer]vier Interactive Patient Education 2016 [employer]  

## 2016-09-25 NOTE — Progress Notes (Signed)
Subjective:    Patient ID: Emily Wall, female    DOB: 05-21-1936, 80 y.o.   MRN: [id number]  Patient here today for follow up of chronic medical problems. No changes since last visit. No complaints today.   Outpatient Encounter Prescriptions as of 09/25/2016  Medication Sig  . alendronate (FOSAMAX) 70 MG tablet TAKE 1 TABLET BY MOUTH ONCE A WEEK WITH A FULL GLASS OF WATER ON AN EMPTY STOMACH  . aspirin 325 MG tablet Take 325 mg by mouth daily.    Marland Kitchen atorvastatin (LIPITOR) 20 MG tablet TAKE ONE TABLET BY MOUTH ONE TIME DAILY  . Calcium Carbonate-Vitamin D (CALCIUM + D PO) Take 1 tablet by mouth daily.    . carvedilol (COREG) 25 MG tablet TAKE 1 TABLET (25 MG TOTAL) BY MOUTH 2 (TWO) TIMES DAILY WITH A MEAL.  . cholecalciferol (VITAMIN D) 1000 UNITS tablet Take 1,000 Units by mouth daily.   . ferrous sulfate 325 (65 FE) MG tablet Take 1 tablet (325 mg total) by mouth daily with breakfast.  . furosemide (LASIX) 20 MG tablet TAKE 1 TABLET (20 MG TOTAL) BY MOUTH DAILY AS NEEDED.  Marland Kitchen glucose blood test strip   . JANUVIA 100 MG tablet TAKE ONE TABLET BY MOUTH ONE TIME DAILY  . LANCETS ULTRA THIN MISC   . lisinopril (PRINIVIL,ZESTRIL) 40 MG tablet TAKE 1 TABLET (40 MG TOTAL) BY MOUTH DAILY.  . metFORMIN (GLUCOPHAGE) 1000 MG tablet TAKE 1 TABLET (1,000 MG TOTAL) BY MOUTH 2 (TWO) TIMES DAILY WITH A MEAL.  Marland Kitchen triamcinolone cream (KENALOG) 0.1 % APPLY TOPICALLY ONCE DAILY   No facility-administered encounter medications on file as of 09/25/2016.      Diabetes  She presents for her follow-up diabetic visit. She has type 2 diabetes mellitus. No MedicAlert identification noted. Pertinent negatives for hypoglycemia include no headaches. Pertinent negatives for diabetes include no chest pain, no polydipsia, no polyphagia, no polyuria, no visual change and no weakness. Risk factors for coronary artery disease include diabetes mellitus, dyslipidemia, hypertension and post-menopausal. Current diabetic  treatment includes oral agent (dual therapy). She is compliant with treatment all of the time. Her weight is stable. She is following a diabetic diet. When asked about meal planning, she reported none. She has not had a previous visit with a dietitian. She never participates in exercise. Her breakfast blood glucose is taken between 8-9 am. Her breakfast blood glucose range is generally 130-140 mg/dl. Her overall blood glucose range is 130-140 mg/dl. An ACE inhibitor/angiotensin II receptor blocker is being taken. She does not see a podiatrist.Eye exam is not current.  Hypertension  This is a chronic problem. The current episode started more than 1 year ago. Pertinent negatives include no chest pain, headaches, palpitations or shortness of breath. Risk factors for coronary artery disease include diabetes mellitus, dyslipidemia and post-menopausal state. Past treatments include diuretics. The current treatment provides moderate improvement.  Hyperlipidemia  This is a chronic problem. The current episode started more than 1 year ago. The problem is controlled. Recent lipid tests were reviewed and are high. Pertinent negatives include no chest pain, myalgias or shortness of breath. Current antihyperlipidemic treatment includes statins. The current treatment provides mild improvement of lipids. There are no compliance problems.  Risk factors for coronary artery disease include dyslipidemia, diabetes mellitus, hypertension and post-menopausal.  Peripheral edema Lasix daily- keeps swelling down. Anemia Ferrous sulfate daily Secondary cardiomyopathy Sees cardiologist every 6 months- no c/o fatigue or SOB, will see in this month  Review of  Systems  Constitutional: Negative.   HENT: Positive for rhinorrhea and sinus pressure.   Eyes: Negative.   Respiratory: Negative.  Negative for shortness of breath.   Cardiovascular: Negative.  Negative for chest pain and palpitations.  Gastrointestinal: Negative.    Endocrine: Negative.  Negative for polydipsia, polyphagia and polyuria.  Genitourinary: Negative.   Musculoskeletal: Negative.  Negative for myalgias.  Skin:       Eczema to left forearm itching  Neurological: Negative.  Negative for weakness and headaches.  Hematological: Negative.   Psychiatric/Behavioral: Negative.   All other systems reviewed and are negative.      Objective:   Physical Exam  Constitutional: She is oriented to person, place, and time. She appears well-developed and well-nourished.  HENT:  Head: Normocephalic.  Right Ear: External ear normal.  Left Ear: External ear normal.  Nose: Nose normal.  Mouth/Throat: Oropharynx is clear and moist.  Eyes: Conjunctivae and EOM are normal. Pupils are equal, round, and reactive to light.  Neck: Trachea normal, normal range of motion and full passive range of motion without pain. Neck supple. No JVD present. Carotid bruit is not present. No thyromegaly present.  Cardiovascular: Normal rate and intact distal pulses.  An irregular rhythm present. Exam reveals no gallop and no friction rub.   Murmur (4/6 systolic murmur) heard. Bilateral carotid bruits  Pulmonary/Chest: Effort normal and breath sounds normal.  Abdominal: Soft. Bowel sounds are normal. She exhibits no distension and no mass. There is no tenderness.  Musculoskeletal: Normal range of motion.  Lymphadenopathy:    She has no cervical adenopathy.  Neurological: She is alert and oriented to person, place, and time. She has normal reflexes.  Skin: Skin is warm and dry.  Eczema to left forearm  Psychiatric: She has a normal mood and affect. Her behavior is normal. Judgment and thought content normal.   BP (!) 142/76 (BP Location: Left Arm, Cuff Size: Normal)   Pulse 80   Temp 97.2 F (36.2 C) (Oral)   Ht 5' 5"  (1.651 m)   Wt 161 lb (73 kg)   BMI 26.79 kg/m    HgbA1c 6.9% up form 6.7% at last visit     Assessment & Plan:   1. Essential hypertension Do  not add salt to diet - CMP14+EGFR - lisinopril (PRINIVIL,ZESTRIL) 40 MG tablet; Take 1 tablet (40 mg total) by mouth daily.  Dispense: 30 tablet; Refill: 5 - carvedilol (COREG) 25 MG tablet; Take 1 tablet (25 mg total) by mouth 2 (two) times daily with a meal.  Dispense: 60 tablet; Refill: 5  2. Hyperlipidemia, unspecified hyperlipidemia type Low fat diet - Lipid panel - atorvastatin (LIPITOR) 20 MG tablet; Take 1 tablet (20 mg total) by mouth daily.  Dispense: 90 tablet; Refill: 1  3. Type 2 diabetes mellitus without complication, without long-term current use of insulin (HCC) Continue to watch carbsin diet - Bayer DCA Hb A1c Waived - sitaGLIPtin (JANUVIA) 100 MG tablet; Take 1 tablet (100 mg total) by mouth daily.  Dispense: 60 tablet; Refill: 5  4. BMI 26.0-26.9,adult Discussed diet and exercise for person with BMI >25 Will recheck weight in 3-6 months  5. Age-related osteoporosis without current pathological fracture Weight bearing exercises  6. Secondary cardiomyopathy ([place])  7. Iron deficiency anemia, unspecified iron deficiency anemia type - ferrous sulfate 325 (65 FE) MG tablet; Take 1 tablet (325 mg total) by mouth daily with breakfast.  Dispense: 30 tablet; Refill: 5  8. Aortic valve disorder  9. Peripheral edema  Low fat diet - furosemide (LASIX) 20 MG tablet; Take 1 tablet (20 mg total) by mouth daily as needed.  Dispense: 30 tablet; Refill: 5    Labs pending Health maintenance reviewed Diet and exercise encouraged Continue all meds Follow up  In 3 month    [place], FNP

## 2016-09-25 NOTE — Addendum Note (Signed)
Addended by: Chevis Pretty on: 09/25/2016 12:40 PM   Modules accepted: Orders

## 2016-09-26 ENCOUNTER — Other Ambulatory Visit: Payer: Self-pay | Admitting: *Deleted

## 2016-09-26 LAB — LIPID PANEL
CHOL/HDL RATIO: 2.2 ratio (ref 0.0–4.4)
CHOLESTEROL TOTAL: 107 mg/dL (ref 100–199)
HDL: 48 mg/dL (ref >39)
LDL CALC: 49 mg/dL (ref 0–99)
TRIGLYCERIDES: 48 mg/dL (ref 0–149)
VLDL Cholesterol Cal: 10 mg/dL (ref 5–40)

## 2016-09-26 LAB — CMP14+EGFR
ALK PHOS: 66 IU/L (ref 39–117)
ALT: 10 IU/L (ref 0–32)
AST: 13 IU/L (ref 0–40)
Albumin/Globulin Ratio: 1.1 — ABNORMAL LOW (ref 1.2–2.2)
Albumin: 3.6 g/dL (ref 3.5–4.8)
BUN/Creatinine Ratio: 16 (ref 12–28)
BUN: 11 mg/dL (ref 8–27)
Bilirubin Total: 0.3 mg/dL (ref 0.0–1.2)
CALCIUM: 9 mg/dL (ref 8.7–10.3)
CO2: 26 mmol/L (ref 18–29)
CREATININE: 0.68 mg/dL (ref 0.57–1.00)
Chloride: 95 mmol/L — ABNORMAL LOW (ref 96–106)
GFR calc Af Amer: 98 mL/min/{1.73_m2} (ref >59)
GFR, EST NON AFRICAN AMERICAN: 85 mL/min/{1.73_m2} (ref >59)
GLOBULIN, TOTAL: 3.2 g/dL (ref 1.5–4.5)
Glucose: 126 mg/dL — ABNORMAL HIGH (ref 65–99)
POTASSIUM: 3.9 mmol/L (ref 3.5–5.2)
SODIUM: 135 mmol/L (ref 134–144)
Total Protein: 6.8 g/dL (ref 6.0–8.5)

## 2016-09-27 ENCOUNTER — Other Ambulatory Visit: Payer: Self-pay

## 2016-09-27 DIAGNOSIS — R609 Edema, unspecified: Secondary | ICD-10-CM

## 2016-09-27 MED ORDER — FUROSEMIDE 20 MG PO TABS: 20.0000 mg | ORAL_TABLET | Freq: Every day | ORAL | 1 refills | Status: DC | PRN

## 2016-09-29 ENCOUNTER — Telehealth: Payer: Self-pay | Admitting: Nurse Practitioner

## 2016-09-29 NOTE — Telephone Encounter (Signed)
Samples are ready for pick up.  

## 2016-10-18 ENCOUNTER — Ambulatory Visit (INDEPENDENT_AMBULATORY_CARE_PROVIDER_SITE_OTHER): Payer: Commercial Managed Care - HMO

## 2016-10-18 ENCOUNTER — Other Ambulatory Visit: Payer: Self-pay

## 2016-10-18 DIAGNOSIS — I359 Nonrheumatic aortic valve disorder, unspecified: Secondary | ICD-10-CM

## 2016-10-20 ENCOUNTER — Telehealth: Payer: Self-pay | Admitting: *Deleted

## 2016-10-20 NOTE — Telephone Encounter (Signed)
Patient informed and copy sent to PCP. 

## 2016-10-20 NOTE — Telephone Encounter (Signed)
-----   Message from Satira Sark, MD sent at 10/18/2016  4:15 PM EDT ----- Results reviewed. Overall stable findings with LVEF 45% and moderate aortic stenosis. Continue with current medications and follow-up plan. A copy of this test should be forwarded to [facility], FNP.

## 2016-11-01 ENCOUNTER — Telehealth: Payer: Self-pay | Admitting: Nurse Practitioner

## 2016-11-01 NOTE — Telephone Encounter (Signed)
No samples, patient aware.

## 2016-11-10 NOTE — Telephone Encounter (Signed)
Patient notified that samples up front ready to pick up. Patient verbalized understanding

## 2016-11-10 NOTE — Telephone Encounter (Signed)
MMM - she wants know know what else she can use - since we do not have samples.

## 2016-11-10 NOTE — Telephone Encounter (Signed)
Can give her tradjenta 10mg  samples

## 2016-11-10 NOTE — Telephone Encounter (Signed)
Only samples of tradjenta 5 mg in closet. Given #28 and instructed to take 2 tablets daily

## 2016-11-25 ENCOUNTER — Other Ambulatory Visit: Payer: Self-pay | Admitting: Nurse Practitioner

## 2016-11-27 ENCOUNTER — Telehealth: Payer: Self-pay | Admitting: Nurse Practitioner

## 2016-11-27 NOTE — Telephone Encounter (Signed)
Pt requesting Tradjenta samples Okayed per MMM Samples to front for pt pick up

## 2016-12-02 ENCOUNTER — Other Ambulatory Visit: Payer: Self-pay | Admitting: Nurse Practitioner

## 2017-01-17 ENCOUNTER — Other Ambulatory Visit: Payer: Self-pay | Admitting: Nurse Practitioner

## 2017-01-17 DIAGNOSIS — I10 Essential (primary) hypertension: Secondary | ICD-10-CM

## 2017-01-17 DIAGNOSIS — E119 Type 2 diabetes mellitus without complications: Secondary | ICD-10-CM

## 2017-01-17 MED ORDER — METFORMIN HCL 1000 MG PO TABS: 1000.0000 mg | ORAL_TABLET | Freq: Two times a day (BID) | ORAL | 0 refills | Status: DC

## 2017-01-17 MED ORDER — LISINOPRIL 40 MG PO TABS: 40.0000 mg | ORAL_TABLET | Freq: Every day | ORAL | 0 refills | Status: DC

## 2017-01-22 ENCOUNTER — Telehealth: Payer: Self-pay

## 2017-01-22 ENCOUNTER — Other Ambulatory Visit: Payer: Self-pay | Admitting: Nurse Practitioner

## 2017-01-22 DIAGNOSIS — E041 Nontoxic single thyroid nodule: Secondary | ICD-10-CM

## 2017-01-22 NOTE — Progress Notes (Signed)
Reorder thyroid u/s

## 2017-01-24 NOTE — Telephone Encounter (Signed)
x

## 2017-01-26 ENCOUNTER — Ambulatory Visit (HOSPITAL_COMMUNITY): Payer: Commercial Managed Care - HMO

## 2017-01-30 ENCOUNTER — Ambulatory Visit (HOSPITAL_COMMUNITY)
Admission: RE | Admit: 2017-01-30 | Discharge: 2017-01-30 | Disposition: A | Discharge status: Home / Self Care | Payer: Medicare HMO | Source: Ambulatory Visit | Attending: Nurse Practitioner | Admitting: Nurse Practitioner

## 2017-01-30 DIAGNOSIS — E042 Nontoxic multinodular goiter: Secondary | ICD-10-CM | POA: Diagnosis not present

## 2017-01-30 DIAGNOSIS — E041 Nontoxic single thyroid nodule: Secondary | ICD-10-CM

## 2017-02-19 ENCOUNTER — Other Ambulatory Visit: Payer: Self-pay | Admitting: *Deleted

## 2017-02-19 MED ORDER — ALENDRONATE SODIUM 70 MG PO TABS: ORAL_TABLET | ORAL | 3 refills | Status: DC

## 2017-04-18 ENCOUNTER — Other Ambulatory Visit: Payer: Self-pay | Admitting: Nurse Practitioner

## 2017-04-18 DIAGNOSIS — R609 Edema, unspecified: Secondary | ICD-10-CM

## 2017-04-20 ENCOUNTER — Other Ambulatory Visit: Payer: Self-pay | Admitting: Nurse Practitioner

## 2017-04-20 DIAGNOSIS — R609 Edema, unspecified: Secondary | ICD-10-CM

## 2017-04-23 NOTE — Telephone Encounter (Signed)
Last refill without being seen 

## 2017-04-23 NOTE — Telephone Encounter (Signed)
NA-  Patient ntbs for any further refills

## 2017-04-25 NOTE — Progress Notes (Deleted)
Cardiology Office Note  Date: 04/25/2017   ID: Emily Wall, DOB 1936/11/29, MRN [id number]  PCP: Chevis Pretty, FNP  Primary Cardiologist: Rozann Lesches, MD   No chief complaint on file.   History of Present Illness: Emily Wall is a 81 y.o. female last seen in April 2017.  Follow-up echocardiogram from October of last year is outlined below, moderate aortic stenosis noted at that time.  Past Medical History:  Diagnosis Date  . Aortic regurgitation    Mild  . Aortic stenosis    Moderate  . Coronary atherosclerosis of native coronary artery    Nonobstructive 2007  . Essential hypertension, benign   . Hyperlipidemia   . Hypothyroidism   . Nonischemic cardiomyopathy (HCC)    LVEF improved to 50-55%  . Osteoporosis   . Type 2 diabetes mellitus ([place])     Past Surgical History:  Procedure Laterality Date  . SKIN GRAFT      Current Outpatient Prescriptions  Medication Sig Dispense Refill  . alendronate (FOSAMAX) 70 MG tablet TAKE 1 TABLET BY MOUTH ONCE A WEEK WITH A FULL GLASS OF WATER ON AN EMPTY STOMACH 12 tablet 3  . aspirin 325 MG tablet Take 325 mg by mouth daily.      Marland Kitchen atorvastatin (LIPITOR) 20 MG tablet Take 1 tablet (20 mg total) by mouth daily. 90 tablet 1  . Calcium Carbonate-Vitamin D (CALCIUM + D PO) Take 1 tablet by mouth daily.      . carvedilol (COREG) 25 MG tablet Take 1 tablet (25 mg total) by mouth 2 (two) times daily with a meal. 60 tablet 5  . cholecalciferol (VITAMIN D) 1000 UNITS tablet Take 1,000 Units by mouth daily.     . ferrous sulfate 325 (65 FE) MG tablet Take 1 tablet (325 mg total) by mouth daily with breakfast. 30 tablet 5  . furosemide (LASIX) 20 MG tablet TAKE 1 TABLET (20 MG TOTAL) BY MOUTH DAILY AS NEEDED. 90 tablet 0  . glucose blood test strip     . LANCETS ULTRA THIN MISC     . lisinopril (PRINIVIL,ZESTRIL) 40 MG tablet Take 1 tablet (40 mg total) by mouth daily. 90 tablet 0  . metFORMIN (GLUCOPHAGE) 1000 MG  tablet Take 1 tablet (1,000 mg total) by mouth 2 (two) times daily with a meal. 180 tablet 0  . sitaGLIPtin (JANUVIA) 100 MG tablet Take 1 tablet (100 mg total) by mouth daily. 60 tablet 5  . triamcinolone cream (KENALOG) 0.1 % APPLY TOPICALLY 2 (TWO) TIMES DAILY. 454 g 1   No current facility-administered medications for this visit.    Allergies:  Patient has no known allergies.   Social History: The patient  reports that she quit smoking about 28 years ago. Her smoking use included Cigarettes. She has a 15.00 pack-year smoking history. She has never used smokeless tobacco. She reports that she does not drink alcohol or use drugs.   Family History: The patient's family history includes Colon cancer in her brother and mother; Diabetes in her brother; Prostate cancer in her brother.   ROS:  Please see the history of present illness. Otherwise, complete review of systems is positive for {NONE DEFAULTED:18576::"none"}.  All other systems are reviewed and negative.   Physical Exam: VS:  There were no vitals taken for this visit., BMI There is no height or weight on file to calculate BMI.  Wt Readings from Last 3 Encounters:  09/25/16 161 lb (73 kg)  04/03/16 160 lb 9.6  oz (72.8 kg)  02/15/16 156 lb 6.4 oz (70.9 kg)    Overweight woman in no acute distress.  HEENT: Conjunctiva and lids normal, oropharynx with moist mucosa.  Neck: Supple, no elevated JVP or bruits.  Lungs: Clear to auscultation, nonlabored.  Cardiac: Regular rate and rhythm, 3/6 systolic murmur heard at the base, second heart sound preserved, no S3 gallop or pericardial rub, no loud diastolic murmur.  Abdomen: Soft, nontender, bowel sounds present.  Extremities: No pitting edema, distal pulses full.   ECG: I personally reviewed the tracing from 04/03/2016 which showed sinus rhythm with lead artifact and nonspecific T-wave changes.  Recent Labwork: 09/25/2016: ALT 10; AST 13; BUN 11; Creatinine, Ser 0.68; Potassium 3.9;  Sodium 135     Component Value Date/Time   CHOL 107 09/25/2016 1152   CHOL 119 06/02/2013 1318   TRIG 48 09/25/2016 1152   TRIG 50 02/01/2015 0913   TRIG 41 06/02/2013 1318   HDL 48 09/25/2016 1152   HDL 53 02/01/2015 0913   HDL 52 06/02/2013 1318   CHOLHDL 2.2 09/25/2016 1152   LDLCALC 49 09/25/2016 1152   LDLCALC 49 06/22/2014 1630   LDLCALC 59 06/02/2013 1318    Other Studies Reviewed Today:  Echocardiogram 10/18/2016: Study Conclusions  - Left ventricle: The cavity size was normal. Wall thickness was   increased in a pattern of mild LVH. Systolic function was mildly   reduced. The estimated ejection fraction was = 45%. Diffuse   hypokinesis. Doppler parameters are consistent with abnormal left   ventricular relaxation (grade 1 diastolic dysfunction). Doppler   parameters are consistent with high ventricular filling pressure. - Aortic valve: Moderately calcified annulus. Moderately thickened   leaflets. There was moderate stenosis. There was moderate   regurgitation. Mean gradient (S): 28 mm Hg. Valve area (VTI):   1.03 cm^2. - Mitral valve: Moderately calcified annulus. Moderately thickened   leaflets . There was mild regurgitation. - Left atrium: The atrium was severely dilated. - Right atrium: The atrium was mildly dilated. - Technically adequate study.  Assessment and Plan:    Current medicines were reviewed with the patient today.  No orders of the defined types were placed in this encounter.   Disposition:  Signed, Satira Sark, MD, [facility] 04/25/2017 9:51 AM    [facility] [place], [place], [place] [address] Phone: [phone]; Fax: [phone]

## 2017-04-27 ENCOUNTER — Ambulatory Visit: Payer: Commercial Managed Care - HMO | Admitting: Cardiology

## 2017-05-05 ENCOUNTER — Other Ambulatory Visit: Payer: Self-pay | Admitting: Nurse Practitioner

## 2017-05-05 DIAGNOSIS — I10 Essential (primary) hypertension: Secondary | ICD-10-CM

## 2017-05-07 NOTE — Telephone Encounter (Signed)
Last refill without being seen 

## 2017-05-07 NOTE — Telephone Encounter (Signed)
Pt aware appt made

## 2017-05-16 NOTE — Progress Notes (Signed)
Cardiology Office Note  Date: 05/17/2017   ID: Emily Wall, DOB 03-12-1936, MRN [id number]  PCP: Chevis Pretty, FNP  Primary Cardiologist: Rozann Lesches, MD   Chief Complaint  Patient presents with  . Aortic valve disease    History of Present Illness: Emily Wall is a 81 y.o. female last seen in April 2017. She is here today for a routine follow-up visit. She does not report any definite change in stamina. Has had intermittent upper respiratory tract infections over the winter and spring. Also trouble with the pollen. She does not endorse any exertional chest pain.  Follow-up echocardiogram from October 2017 is outlined below. LVEF is approximately 45%. Aortic stenosis is in the moderate range, mean gradient 28 mmHg.  I personally reviewed her ECG today which shows sinus rhythm with PVCs, decreased R wave progression area and nonspecific T-wave changes.  Current cardiac regimen includes aspirin, Coreg, Lasix, lisinopril, and Lipitor.  Past Medical History:  Diagnosis Date  . Aortic regurgitation    Mild  . Aortic stenosis    Moderate  . Coronary atherosclerosis of native coronary artery    Nonobstructive 2007  . Essential hypertension, benign   . Hyperlipidemia   . Hypothyroidism   . Nonischemic cardiomyopathy (HCC)    LVEF improved to 50-55%  . Osteoporosis   . Type 2 diabetes mellitus ([place])     Past Surgical History:  Procedure Laterality Date  . SKIN GRAFT      Current Outpatient Prescriptions  Medication Sig Dispense Refill  . alendronate (FOSAMAX) 70 MG tablet TAKE 1 TABLET BY MOUTH ONCE A WEEK WITH A FULL GLASS OF WATER ON AN EMPTY STOMACH 12 tablet 3  . aspirin 325 MG tablet Take 325 mg by mouth daily.      Marland Kitchen atorvastatin (LIPITOR) 20 MG tablet Take 1 tablet (20 mg total) by mouth daily. 90 tablet 1  . Calcium Carbonate-Vitamin D (CALCIUM + D PO) Take 1 tablet by mouth daily.      . carvedilol (COREG) 25 MG tablet TAKE 1 TABLET (25 MG  TOTAL) BY MOUTH 2 (TWO) TIMES DAILY WITH A MEAL. 60 tablet 0  . cholecalciferol (VITAMIN D) 1000 UNITS tablet Take 1,000 Units by mouth daily.     . ferrous sulfate 325 (65 FE) MG tablet Take 1 tablet (325 mg total) by mouth daily with breakfast. 30 tablet 5  . furosemide (LASIX) 20 MG tablet TAKE 1 TABLET (20 MG TOTAL) BY MOUTH DAILY AS NEEDED. 90 tablet 0  . glucose blood test strip     . LANCETS ULTRA THIN MISC     . lisinopril (PRINIVIL,ZESTRIL) 40 MG tablet Take 1 tablet (40 mg total) by mouth daily. 90 tablet 0  . metFORMIN (GLUCOPHAGE) 1000 MG tablet Take 1 tablet (1,000 mg total) by mouth 2 (two) times daily with a meal. 180 tablet 0  . sitaGLIPtin (JANUVIA) 100 MG tablet Take 1 tablet (100 mg total) by mouth daily. 60 tablet 5  . triamcinolone cream (KENALOG) 0.1 % APPLY TOPICALLY 2 (TWO) TIMES DAILY. 454 g 1   No current facility-administered medications for this visit.    Allergies:  Patient has no known allergies.   Social History: The patient  reports that she quit smoking about 28 years ago. Her smoking use included Cigarettes. She has a 15.00 pack-year smoking history. She has never used smokeless tobacco. She reports that she does not drink alcohol or use drugs.   ROS:  Please see the history of  present illness. Otherwise, complete review of systems is positive for none.  All other systems are reviewed and negative.   Physical Exam: VS:  BP 132/72   Pulse 75   Ht 5\' 5"  (1.651 m)   Wt 156 lb (70.8 kg)   SpO2 98%   BMI 25.96 kg/m , BMI Body mass index is 25.96 kg/m.  Wt Readings from Last 3 Encounters:  05/17/17 156 lb (70.8 kg)  09/25/16 161 lb (73 kg)  04/03/16 160 lb 9.6 oz (72.8 kg)    Overweight woman in no acute distress.  HEENT: Conjunctiva and lids normal, oropharynx with moist mucosa.  Neck: Supple, no elevated JVP or bruits.  Lungs: Clear to auscultation, nonlabored.  Cardiac: Regular rate and rhythm, 3/6 systolic murmur heard at the base, second  heart sound preserved, no S3 gallop or pericardial rub, no loud diastolic murmur.  Abdomen: Soft, nontender, bowel sounds present.  Extremities: No pitting edema, distal pulses full.  Skin: Warm and dry. Musculoskeletal: No kyphosis. Neuropsychiatric: Alert and oriented 3, affect appropriate.  ECG: I personally reviewed the tracing from 04/03/2016 which showed sinus rhythm with [place] artifact and nonspecific T-wave changes.  Recent Labwork: 09/25/2016: ALT 10; AST 13; BUN 11; Creatinine, Ser 0.68; Potassium 3.9; Sodium 135     Component Value Date/Time   CHOL 107 09/25/2016 1152   CHOL 119 06/02/2013 1318   TRIG 48 09/25/2016 1152   TRIG 50 02/01/2015 0913   TRIG 41 06/02/2013 1318   HDL 48 09/25/2016 1152   HDL 53 02/01/2015 0913   HDL 52 06/02/2013 1318   CHOLHDL 2.2 09/25/2016 1152   LDLCALC 49 09/25/2016 1152   LDLCALC 49 06/22/2014 1630   LDLCALC 59 06/02/2013 1318    Other Studies Reviewed Today:  Echocardiogram 10/18/2016: Study Conclusions  - Left ventricle: The cavity size was normal. Wall thickness was   increased in a pattern of mild LVH. Systolic function was mildly   reduced. The estimated ejection fraction was = 45%. Diffuse   hypokinesis. Doppler parameters are consistent with abnormal left   ventricular relaxation (grade 1 diastolic dysfunction). Doppler   parameters are consistent with high ventricular filling pressure. - Aortic valve: Moderately calcified annulus. Moderately thickened   leaflets. There was moderate stenosis. There was moderate   regurgitation. Mean gradient (S): 28 mm Hg. Valve area (VTI):   1.03 cm^2. - Mitral valve: Moderately calcified annulus. Moderately thickened   leaflets . There was mild regurgitation. - Left atrium: The atrium was severely dilated. - Right atrium: The atrium was mildly dilated. - Technically adequate study.  Assessment and Plan:  1. Aortic stenosis, moderate range by echocardiogram in October of last year.  We have discussed the natural history of progression. Follow-up echocardiogram will be obtained.  2. History of nonobstructive coronary atherosclerosis. No active angina symptoms. She continue on aspirin and statin.  3. Nonischemic cardiomyopathy, LVEF approximately 45% by last assessment. Continue beta blocker and ACE inhibitor with low-dose diuretic.  4. Essential hypertension, systolic blood pressure in the 130s today. No changes were made in medical therapy.  Current medicines were reviewed with the patient today.   Orders Placed This Encounter  Procedures  . EKG 12-Lead  . ECHOCARDIOGRAM COMPLETE    Disposition: Follow-up in 6 months.  Signed, Satira Sark, MD, [facility] 05/17/2017 11:38 AM    [facility] at [place], [place],  [address] Phone: [phone]; Fax: [phone]

## 2017-05-17 ENCOUNTER — Ambulatory Visit (INDEPENDENT_AMBULATORY_CARE_PROVIDER_SITE_OTHER): Payer: Medicare HMO | Admitting: Cardiology

## 2017-05-17 ENCOUNTER — Telehealth: Payer: Self-pay | Admitting: Cardiology

## 2017-05-17 ENCOUNTER — Encounter: Payer: Self-pay | Admitting: Cardiology

## 2017-05-17 VITALS — BP 132/72 | HR 75 | Ht 65.0 in | Wt 156.0 lb

## 2017-05-17 DIAGNOSIS — I10 Essential (primary) hypertension: Secondary | ICD-10-CM

## 2017-05-17 DIAGNOSIS — I2510 Atherosclerotic heart disease of native coronary artery without angina pectoris: Secondary | ICD-10-CM | POA: Diagnosis not present

## 2017-05-17 DIAGNOSIS — I429 Cardiomyopathy, unspecified: Secondary | ICD-10-CM | POA: Diagnosis not present

## 2017-05-17 DIAGNOSIS — I350 Nonrheumatic aortic (valve) stenosis: Secondary | ICD-10-CM | POA: Diagnosis not present

## 2017-05-17 NOTE — Telephone Encounter (Signed)
Pre-cert Verification for the following procedure    Echo scheduled for 06/13/17 at [facility]

## 2017-05-17 NOTE — Patient Instructions (Signed)

## 2017-05-29 ENCOUNTER — Encounter: Payer: Self-pay | Admitting: Nurse Practitioner

## 2017-05-29 ENCOUNTER — Ambulatory Visit (INDEPENDENT_AMBULATORY_CARE_PROVIDER_SITE_OTHER): Payer: Medicare HMO | Admitting: Nurse Practitioner

## 2017-05-29 VITALS — BP 149/75 | HR 78 | Temp 97.2°F | Ht 65.0 in | Wt 160.0 lb

## 2017-05-29 DIAGNOSIS — D509 Iron deficiency anemia, unspecified: Secondary | ICD-10-CM

## 2017-05-29 DIAGNOSIS — Z6826 Body mass index (BMI) 26.0-26.9, adult: Secondary | ICD-10-CM

## 2017-05-29 DIAGNOSIS — I359 Nonrheumatic aortic valve disorder, unspecified: Secondary | ICD-10-CM | POA: Diagnosis not present

## 2017-05-29 DIAGNOSIS — R609 Edema, unspecified: Secondary | ICD-10-CM

## 2017-05-29 DIAGNOSIS — E119 Type 2 diabetes mellitus without complications: Secondary | ICD-10-CM | POA: Diagnosis not present

## 2017-05-29 DIAGNOSIS — E785 Hyperlipidemia, unspecified: Secondary | ICD-10-CM

## 2017-05-29 DIAGNOSIS — I10 Essential (primary) hypertension: Secondary | ICD-10-CM

## 2017-05-29 DIAGNOSIS — M810 Age-related osteoporosis without current pathological fracture: Secondary | ICD-10-CM | POA: Diagnosis not present

## 2017-05-29 LAB — BAYER DCA HB A1C WAIVED: HB A1C: 7.2 % — AB (ref <7.0)

## 2017-05-29 MED ORDER — SITAGLIPTIN PHOSPHATE 100 MG PO TABS: 100.0000 mg | ORAL_TABLET | Freq: Every day | ORAL | 5 refills | Status: DC

## 2017-05-29 MED ORDER — CARVEDILOL 25 MG PO TABS: 25.0000 mg | ORAL_TABLET | Freq: Two times a day (BID) | ORAL | 0 refills | Status: DC

## 2017-05-29 MED ORDER — FERROUS SULFATE 325 (65 FE) MG PO TABS: 325.0000 mg | ORAL_TABLET | Freq: Every day | ORAL | 5 refills | Status: DC

## 2017-05-29 MED ORDER — ATORVASTATIN CALCIUM 20 MG PO TABS: 20.0000 mg | ORAL_TABLET | Freq: Every day | ORAL | 1 refills | Status: DC

## 2017-05-29 MED ORDER — FUROSEMIDE 20 MG PO TABS: 20.0000 mg | ORAL_TABLET | Freq: Every day | ORAL | 0 refills | Status: DC | PRN

## 2017-05-29 MED ORDER — LISINOPRIL 40 MG PO TABS: 40.0000 mg | ORAL_TABLET | Freq: Every day | ORAL | 0 refills | Status: DC

## 2017-05-29 MED ORDER — METFORMIN HCL 1000 MG PO TABS: 1000.0000 mg | ORAL_TABLET | Freq: Two times a day (BID) | ORAL | 0 refills | Status: DC

## 2017-05-29 NOTE — Progress Notes (Signed)
Subjective:    Patient ID: Emily Wall, female    DOB: [birth year], 81 y.o.   MRN: [id number]  HPI  Emily Wall is here today for follow up of chronic medical problem.  Outpatient Encounter Prescriptions as of 05/29/2017  Medication Sig  . alendronate (FOSAMAX) 70 MG tablet TAKE 1 TABLET BY MOUTH ONCE A WEEK WITH A FULL GLASS OF WATER ON AN EMPTY STOMACH  . aspirin 325 MG tablet Take 325 mg by mouth daily.    Marland Kitchen atorvastatin (LIPITOR) 20 MG tablet Take 1 tablet (20 mg total) by mouth daily.  . Calcium Carbonate-Vitamin D (CALCIUM + D PO) Take 1 tablet by mouth daily.    . carvedilol (COREG) 25 MG tablet TAKE 1 TABLET (25 MG TOTAL) BY MOUTH 2 (TWO) TIMES DAILY WITH A MEAL.  . cholecalciferol (VITAMIN D) 1000 UNITS tablet Take 1,000 Units by mouth daily.   . ferrous sulfate 325 (65 FE) MG tablet Take 1 tablet (325 mg total) by mouth daily with breakfast.  . furosemide (LASIX) 20 MG tablet TAKE 1 TABLET (20 MG TOTAL) BY MOUTH DAILY AS NEEDED.  Marland Kitchen glucose blood test strip   . LANCETS ULTRA THIN MISC   . lisinopril (PRINIVIL,ZESTRIL) 40 MG tablet Take 1 tablet (40 mg total) by mouth daily.  . metFORMIN (GLUCOPHAGE) 1000 MG tablet Take 1 tablet (1,000 mg total) by mouth 2 (two) times daily with a meal.  . sitaGLIPtin (JANUVIA) 100 MG tablet Take 1 tablet (100 mg total) by mouth daily.  Marland Kitchen triamcinolone cream (KENALOG) 0.1 % APPLY TOPICALLY 2 (TWO) TIMES DAILY.   No facility-administered encounter medications on file as of 05/29/2017.     1. Essential hypertension  No c/o chest pain ,SOB or HA- does not check blood pressure at home  2. Aortic valve disorder  Sees cardiology on regular basis  3. Type 2 diabetes mellitus without complication, without long-term current use of insulin (HCC) last HGBa1c was 6.9. Doe snot check blood sugars everyday- when does check fasting they avergae around 130's  4. Iron deficiency anemia, unspecified iron deficiency anemia type  No c/o fatigue  5.  Hyperlipidemia, unspecified hyperlipidemia type  Not watch diet and very little exercise  6. BMI 26.0-26.9,adult  No recent weight changes  7. Age-related osteoporosis without current pathological fracture   no c/o back pain  8. Peripheral edema  Does not have everyday- ususly will get better with elevation.    New complaints: None today     Review of Systems  Constitutional: Negative for diaphoresis.  Eyes: Negative for pain.  Respiratory: Negative for shortness of breath.   Cardiovascular: Negative for chest pain, palpitations and leg swelling.  Gastrointestinal: Negative for abdominal pain.  Endocrine: Negative for polydipsia.  Skin: Negative for rash.  Neurological: Negative for dizziness, weakness and headaches.  Hematological: Does not bruise/bleed easily.       Objective:   Physical Exam  Constitutional: She is oriented to person, place, and time. She appears well-developed and well-nourished.  HENT:  Nose: Nose normal.  Mouth/Throat: Oropharynx is clear and moist.  Eyes: EOM are normal.  Neck: Trachea normal, normal range of motion and full passive range of motion without pain. Neck supple. No JVD present. Carotid bruit is not present. No thyromegaly present.  Cardiovascular: Normal rate, regular rhythm and intact distal pulses.  Exam reveals no gallop and no friction rub.   Murmur (3/6 systolic murmur) heard. Pulmonary/Chest: Effort normal and breath sounds normal.  Abdominal: Soft. Bowel  sounds are normal. She exhibits no distension and no mass. There is no tenderness.  Musculoskeletal: Normal range of motion.  Lymphadenopathy:    She has no cervical adenopathy.  Neurological: She is alert and oriented to person, place, and time. She has normal reflexes.  Skin: Skin is warm and dry.  Psychiatric: She has a normal mood and affect. Her behavior is normal. Judgment and thought content normal.   BP (!) 149/75   Pulse 78   Temp 97.2 F (36.2 C) (Oral)   Ht 5'  5" (1.651 m)   Wt 160 lb (72.6 kg)   BMI 26.63 kg/m   hgba1c 7.2%     Assessment & Plan:  1. Essential hypertension Low sodium diet - CMP14+EGFR - lisinopril (PRINIVIL,ZESTRIL) 40 MG tablet; Take 1 tablet (40 mg total) by mouth daily.  Dispense: 90 tablet; Refill: 0 - carvedilol (COREG) 25 MG tablet; Take 1 tablet (25 mg total) by mouth 2 (two) times daily with a meal.  Dispense: 60 tablet; Refill: 0  2. Aortic valve disorder Keep follow up appointmnets with cardiology  3. Type 2 diabetes mellitus without complication, without long-term current use of insulin (HCC) carbs counting encouraged - Bayer DCA Hb A1c Waived - Microalbumin / creatinine urine ratio - sitaGLIPtin (JANUVIA) 100 MG tablet; Take 1 tablet (100 mg total) by mouth daily.  Dispense: 60 tablet; Refill: 5 - metFORMIN (GLUCOPHAGE) 1000 MG tablet; Take 1 tablet (1,000 mg total) by mouth 2 (two) times daily with a meal.  Dispense: 180 tablet; Refill: 0  4. Iron deficiency anemia, unspecified iron deficiency anemia type - ferrous sulfate 325 (65 FE) MG tablet; Take 1 tablet (325 mg total) by mouth daily with breakfast.  Dispense: 30 tablet; Refill: 5 - Anemia Profile B  5. Hyperlipidemia, unspecified hyperlipidemia type Low fta diet - Lipid panel - atorvastatin (LIPITOR) 20 MG tablet; Take 1 tablet (20 mg total) by mouth daily.  Dispense: 90 tablet; Refill: 1  6. BMI 26.0-26.9,adult Discussed diet and exercise for person with BMI >25 Will recheck weight in 3-6 months  7. Age-related osteoporosis without current pathological fracture Weight bearing exercises  8. Peripheral edema Elevate legs when sitting - furosemide (LASIX) 20 MG tablet; Take 1 tablet (20 mg total) by mouth daily as needed.  Dispense: 90 tablet; Refill: 0   Patient to schedule eye exam Labs pending Health maintenance reviewed Diet and exercise encouraged Continue all meds Follow up  In 3 months   [place], FNP

## 2017-05-29 NOTE — Patient Instructions (Signed)

## 2017-05-30 LAB — ANEMIA PROFILE B
BASOS ABS: 0 10*3/uL (ref 0.0–0.2)
Basos: 1 %
EOS (ABSOLUTE): 0.2 10*3/uL (ref 0.0–0.4)
EOS: 4 %
FOLATE: 7.6 ng/mL (ref >3.0)
Ferritin: 60 ng/mL (ref 15–150)
Hematocrit: 31.5 % — ABNORMAL LOW (ref 34.0–46.6)
Hemoglobin: 10.1 g/dL — ABNORMAL LOW (ref 11.1–15.9)
IMMATURE GRANULOCYTES: 0 %
IRON SATURATION: 19 % (ref 15–55)
IRON: 47 ug/dL (ref 27–139)
Immature Grans (Abs): 0 10*3/uL (ref 0.0–0.1)
LYMPHS ABS: 1.3 10*3/uL (ref 0.7–3.1)
Lymphs: 32 %
MCH: 29.8 pg (ref 26.6–33.0)
MCHC: 32.1 g/dL (ref 31.5–35.7)
MCV: 93 fL (ref 79–97)
MONOS ABS: 0.3 10*3/uL (ref 0.1–0.9)
Monocytes: 7 %
NEUTROS ABS: 2.2 10*3/uL (ref 1.4–7.0)
NEUTROS PCT: 56 %
PLATELETS: 202 10*3/uL (ref 150–379)
RBC: 3.39 x10E6/uL — ABNORMAL LOW (ref 3.77–5.28)
RDW: 15.4 % (ref 12.3–15.4)
Retic Ct Pct: 0.8 % (ref 0.6–2.6)
Total Iron Binding Capacity: 253 ug/dL (ref 250–450)
UIBC: 206 ug/dL (ref 118–369)
Vitamin B-12: 190 pg/mL — ABNORMAL LOW (ref 232–1245)
WBC: 4 10*3/uL (ref 3.4–10.8)

## 2017-05-30 LAB — LIPID PANEL
CHOLESTEROL TOTAL: 90 mg/dL — AB (ref 100–199)
Chol/HDL Ratio: 2.2 ratio (ref 0.0–4.4)
HDL: 41 mg/dL (ref >39)
LDL CALC: 38 mg/dL (ref 0–99)
Triglycerides: 57 mg/dL (ref 0–149)
VLDL Cholesterol Cal: 11 mg/dL (ref 5–40)

## 2017-05-30 LAB — CMP14+EGFR
ALBUMIN: 3.6 g/dL (ref 3.5–4.8)
ALK PHOS: 52 IU/L (ref 39–117)
ALT: 19 IU/L (ref 0–32)
AST: 17 IU/L (ref 0–40)
Albumin/Globulin Ratio: 1.4 (ref 1.2–2.2)
BILIRUBIN TOTAL: 0.3 mg/dL (ref 0.0–1.2)
BUN / CREAT RATIO: 13 (ref 12–28)
BUN: 8 mg/dL (ref 8–27)
CHLORIDE: 101 mmol/L (ref 96–106)
CO2: 27 mmol/L (ref 18–29)
Calcium: 8.5 mg/dL — ABNORMAL LOW (ref 8.7–10.3)
Creatinine, Ser: 0.62 mg/dL (ref 0.57–1.00)
GFR calc Af Amer: 100 mL/min/{1.73_m2} (ref >59)
GFR calc non Af Amer: 87 mL/min/{1.73_m2} (ref >59)
GLOBULIN, TOTAL: 2.6 g/dL (ref 1.5–4.5)
Glucose: 140 mg/dL — ABNORMAL HIGH (ref 65–99)
Potassium: 3.9 mmol/L (ref 3.5–5.2)
SODIUM: 139 mmol/L (ref 134–144)
Total Protein: 6.2 g/dL (ref 6.0–8.5)

## 2017-05-30 LAB — MICROALBUMIN / CREATININE URINE RATIO
CREATININE, UR: 9.9 mg/dL
MICROALBUM., U, RANDOM: 3.2 ug/mL

## 2017-06-01 ENCOUNTER — Telehealth: Payer: Self-pay | Admitting: *Deleted

## 2017-06-01 NOTE — Telephone Encounter (Signed)
Januvia is $300 per month Pt can not afford Please change to something else

## 2017-06-01 NOTE — Telephone Encounter (Signed)
Last hgba1c was 7.2 so as long as watch diet you can just do withouyt for now.

## 2017-06-04 ENCOUNTER — Other Ambulatory Visit: Payer: Self-pay | Admitting: Nurse Practitioner

## 2017-06-04 DIAGNOSIS — I10 Essential (primary) hypertension: Secondary | ICD-10-CM

## 2017-06-06 ENCOUNTER — Other Ambulatory Visit: Payer: Self-pay | Admitting: Nurse Practitioner

## 2017-06-13 ENCOUNTER — Ambulatory Visit (INDEPENDENT_AMBULATORY_CARE_PROVIDER_SITE_OTHER): Payer: Medicare HMO

## 2017-06-13 ENCOUNTER — Other Ambulatory Visit: Payer: Self-pay

## 2017-06-13 DIAGNOSIS — I350 Nonrheumatic aortic (valve) stenosis: Secondary | ICD-10-CM | POA: Diagnosis not present

## 2017-06-15 ENCOUNTER — Telehealth: Payer: Self-pay

## 2017-06-15 NOTE — Telephone Encounter (Signed)
Patient notified and stated BP has been running stable. Routed to PCP

## 2017-06-15 NOTE — Telephone Encounter (Signed)
-----   Message from Rogelia Mire, NP sent at 06/13/2017  5:47 PM EDT ----- Echo shows stable LV function (heart pumping function) and moderate aortic valve disease (both stiffness and leakiness).  Her heart remains stiff - so we just need to be sure that bp is under control (bp was fine when she saw Dr. Domenic Polite).  The mitral valve is also mildly to moderately leaky - also relatively stable.  Overall, this is similar to echo in October and reassuring.  F/u with Dr. Domenic Polite as planned.

## 2017-07-04 ENCOUNTER — Other Ambulatory Visit: Payer: Self-pay | Admitting: Nurse Practitioner

## 2017-07-04 DIAGNOSIS — E785 Hyperlipidemia, unspecified: Secondary | ICD-10-CM

## 2017-07-27 ENCOUNTER — Other Ambulatory Visit: Payer: Self-pay | Admitting: Nurse Practitioner

## 2017-09-15 ENCOUNTER — Other Ambulatory Visit: Payer: Self-pay | Admitting: Nurse Practitioner

## 2017-09-15 DIAGNOSIS — I10 Essential (primary) hypertension: Secondary | ICD-10-CM

## 2017-09-21 ENCOUNTER — Other Ambulatory Visit: Payer: Self-pay | Admitting: *Deleted

## 2017-09-21 DIAGNOSIS — R609 Edema, unspecified: Secondary | ICD-10-CM

## 2017-09-21 MED ORDER — FUROSEMIDE 20 MG PO TABS: 20.0000 mg | ORAL_TABLET | Freq: Every day | ORAL | 0 refills | Status: DC | PRN

## 2017-09-25 ENCOUNTER — Other Ambulatory Visit: Payer: Self-pay | Admitting: Nurse Practitioner

## 2017-09-25 DIAGNOSIS — E119 Type 2 diabetes mellitus without complications: Secondary | ICD-10-CM

## 2017-09-26 NOTE — Telephone Encounter (Signed)
Last seen 05/29/17 MMM  Requesting 90 day supply

## 2017-10-25 ENCOUNTER — Ambulatory Visit (INDEPENDENT_AMBULATORY_CARE_PROVIDER_SITE_OTHER): Payer: Medicare HMO | Admitting: Nurse Practitioner

## 2017-10-25 ENCOUNTER — Ambulatory Visit: Payer: Medicare HMO | Admitting: Nurse Practitioner

## 2017-10-25 ENCOUNTER — Encounter: Payer: Self-pay | Admitting: Nurse Practitioner

## 2017-10-25 VITALS — BP 146/84 | HR 75 | Temp 97.2°F | Ht 65.0 in | Wt 158.0 lb

## 2017-10-25 DIAGNOSIS — S81801A Unspecified open wound, right lower leg, initial encounter: Secondary | ICD-10-CM

## 2017-10-25 DIAGNOSIS — L03115 Cellulitis of right lower limb: Secondary | ICD-10-CM | POA: Diagnosis not present

## 2017-10-25 MED ORDER — CEFTRIAXONE SODIUM 1 G IJ SOLR
1.0000 g | Freq: Once | INTRAMUSCULAR | Status: AC
  Administered 2017-10-25: 1 g via INTRAMUSCULAR

## 2017-10-25 MED ORDER — CIPROFLOXACIN HCL 500 MG PO TABS: 500.0000 mg | ORAL_TABLET | Freq: Two times a day (BID) | ORAL | 0 refills | Status: DC

## 2017-10-25 NOTE — Progress Notes (Signed)
   Subjective:    Patient ID: Emily Wall, female    DOB: [birth year], 81 y.o.   MRN: [id number]  HPI Patient had surgery many years ago on her anterior right lower leg. Took months to heal. She broke out in a rash on the lateral side of lower leg and then surgical wound started getting red and inflamed. They has been using neosprin and triple antibiotic cream on it. Weeping lots of fluid all the way around right lower leg.    Review of Systems  Constitutional: Negative.   Respiratory: Negative.   Cardiovascular: Negative.   Gastrointestinal: Negative.   Skin: Wound: right lower leg.  Neurological: Negative.   Psychiatric/Behavioral: Negative.   All other systems reviewed and are negative.      Objective:   Physical Exam  Constitutional: She is oriented to person, place, and time. She appears well-developed and well-nourished. No distress.  Cardiovascular: Normal rate and regular rhythm.   Pulmonary/Chest: Effort normal and breath sounds normal.  Neurological: She is alert and oriented to person, place, and time.  Skin: Skin is warm.  Open draining oblong shape wound the size of a plum anterior portion right lower leg. Open draining foul smelling sores all around right lower leg.  Psychiatric: She has a normal mood and affect. Her behavior is normal. Judgment and thought content normal.    BP (!) 146/84   Pulse 75   Temp (!) 97.2 F (36.2 C) (Oral)   Ht 5\' 5"  (1.651 m)   Wt 158 lb (71.7 kg)   BMI 26.29 kg/m   Wound cleaned with NACL and xeroform and adb and cotton plus ace wrap    Assessment & Plan:   1. Cellulitis of right lower leg   2. Open wound of right lower leg, initial encounter    Meds ordered this encounter  Medications  . cefTRIAXone (ROCEPHIN) injection 1 g    Order Specific Question:   Antibiotic Indication:    Answer:   Cellulitis  . ciprofloxacin (CIPRO) 500 MG tablet    Sig: Take 1 tablet (500 mg total) by mouth 2 (two) times daily.   Dispense:  20 tablet    Refill:  0    Order Specific Question:   Supervising Provider    Answer:   Eustaquio Maize [[id number]]   Wound dressing applied Change dressing daily until sees wound care RTO prn  Mary-Margaret Hassell Done, FNP

## 2017-10-25 NOTE — Patient Instructions (Signed)
How to Change Your Dressing A dressing is a material that is placed in and over wounds. A dressing helps your wound to heal by protecting it from:  Bacteria.  Worse injury.  Being too dry or too wet.  What are the risks? The sticky (adhesive) tape that is used with a dressing may make your skin sore or irritated, or it may cause a rash. These are the most common problems. However, more serious problems can develop, such as:  Bleeding.  Infection.  How to change your dressing Getting Ready to Change Your Dressing   Take a shower before you do the first dressing change of the day. If your doctor does not want your wound to get wet and your dressing is not waterproof, you may need to put plastic leak-proof sealing wrap on your dressing to protect it.  If needed, take pain medicine as told by your doctor 30 minutes before you change your dressing.  Set up a clean station for wound care. You will need: ? A plastic trash bag that is open and ready to use. ? Hand sanitizer. ? Wound cleanser or salt-water solution (saline) as told by your doctor. ? New dressing material or bandages. Make sure to open the dressing package so the dressing stays on the inside of the package. You may also need these supplies in your clean station:  A box of vinyl gloves.  Tape.  Skin protectant. This may be a wipe, film, or spray.  Clean or germ-free (sterile) scissors.  A cotton-tipped applicator.  Taking Off Your Old Dressing  Wash your hands with soap and water. Dry your hands with a clean towel. If you cannot use soap and water, use hand sanitizer.  If you are using gloves, put on the gloves before you take off the dressing.  Gently take off any adhesive or tape by pulling it off in the direction of your hair growth. Only touch the outside edges of the dressing.  Take off the dressing. If the dressing sticks to your skin, wet the dressing with a germ-free salt-water solution. This helps it  come off more easily.  Take off any gauze or packing in your wound.  Throw the old dressing supplies into the ready trash bag.  Take off your gloves. To take off each glove, grab the cuff with your other hand and turn the glove inside out. Put the gloves in the trash right away.  Wash your hands with soap and water. Dry your hands with a clean towel. If you cannot use soap and water, use hand sanitizer. Cleaning Your Wound  Follow instructions from your doctor about how to clean your wound. This may include using a salt-water solution or recommended wound cleanser.  Do not use over-the-counter medicated or antiseptic creams, sprays, liquids, or dressings unless your doctor tells you to do that.  Use a clean gauze pad to clean the area fully with the salt-water solution or wound cleanser that your doctor recommends.  Throw the gauze pad into the trash bag.  Wash your hands with soap and water. Dry your hands with a clean towel. If you cannot use soap and water, use hand sanitizer. Putting on the Dressing  If your doctor recommended a skin protectant, put it on the skin around the wound.  Cover the wound with the recommended dressing, such as a nonstick gauze or bandage. Make sure to touch only the outside edges of the dressing. Do not touch the inside of the dressing.    Attach the dressing so all sides stay in place. You may do this with the attached medical adhesive, roll gauze, or tape. If you use tape, do not wrap the tape all the way around your arm or leg.  Take off your gloves. Put them in the trash bag with the old dressing. Tie the bag shut and throw it away.  Wash your hands with soap and water. Dry your hands with a clean towel. If you cannot use soap and water, use hand sanitizer. Get help if:   You have new pain.  You have irritation, a rash, or itching around the wound or dressing.  Changing your dressing is painful.  Changing your dressing causes a lot of  bleeding. Get help right away if:  You have very bad pain.  You have signs of infection, such as: ? More redness, swelling, or pain. ? More fluid or blood. ? Warmth. ? Pus or a bad smell. ? Red streaks leading from wound. ? A fever. This information is not intended to replace advice given to you by your health care provider. Make sure you discuss any questions you have with your health care provider. Document Released: 03/09/2009 Document Revised: 05/18/2016 Document Reviewed: 09/16/2015 Elsevier Interactive Patient Education  2018 [employer]  

## 2017-10-29 ENCOUNTER — Telehealth: Payer: Self-pay | Admitting: Nurse Practitioner

## 2017-10-29 NOTE — Telephone Encounter (Signed)
Patient wants to know what you recommend for her to clean her leg with. Patient states that she is not able to find normal saline otc. Please advise

## 2017-10-29 NOTE — Telephone Encounter (Signed)
Patient aware.

## 2017-10-29 NOTE — Telephone Encounter (Signed)
Dial soap is fine

## 2017-10-30 ENCOUNTER — Ambulatory Visit (INDEPENDENT_AMBULATORY_CARE_PROVIDER_SITE_OTHER): Payer: Medicare HMO | Admitting: Nurse Practitioner

## 2017-10-30 ENCOUNTER — Encounter: Payer: Self-pay | Admitting: Nurse Practitioner

## 2017-10-30 VITALS — BP 136/85 | HR 89 | Temp 97.5°F | Ht 65.0 in | Wt 156.0 lb

## 2017-10-30 DIAGNOSIS — L03115 Cellulitis of right lower limb: Secondary | ICD-10-CM

## 2017-10-30 MED ORDER — HYDROCODONE-ACETAMINOPHEN 5-325 MG PO TABS: 1.0000 | ORAL_TABLET | Freq: Four times a day (QID) | ORAL | 0 refills | Status: DC | PRN

## 2017-10-30 NOTE — Progress Notes (Addendum)
   Subjective:    Patient ID: Emily Wall, female    DOB: [birth year], 81 y.o.   MRN: [id number]  HPI Patient comes in today for recheck of right lower leg. She was seen on 10/25/17 with cellulitis of entire right lower ext. We leaned lega and wrapped. Started her  On cipro bid. She says it is getting better. Ordered wound care but no appointment has been made.    Review of Systems  Constitutional: Negative for activity change and appetite change.  HENT: Negative.   Eyes: Negative for pain.  Respiratory: Negative for shortness of breath.   Cardiovascular: Negative for chest pain, palpitations and leg swelling.  Gastrointestinal: Negative for abdominal pain.  Endocrine: Negative for polydipsia.  Genitourinary: Negative.   Skin: Negative for rash.  Neurological: Negative for dizziness, weakness and headaches.  Hematological: Does not bruise/bleed easily.  Psychiatric/Behavioral: Negative.   All other systems reviewed and are negative.      Objective:   Physical Exam  Constitutional: She appears well-developed and well-nourished. No distress.  Cardiovascular: Normal rate.  Pulmonary/Chest: Effort normal and breath sounds normal.  Neurological: She is alert.  Skin: Skin is warm.  Right lower ext Posterior wound has lots of yellowish necrotic tissue and is still draining yellowish excudate. No longer has foul smell,   BP 136/85   Pulse 89   Temp (!) 97.5 F (36.4 C) (Oral)   Ht 5\' 5"  (1.651 m)   Wt 156 lb (70.8 kg)   BMI 25.96 kg/m   Dressing change - nacl to clean- 4x4 and cottton wrap with ace applied     Assessment & Plan:   1. Cellulitis of right lower leg    Continue cipro till done Wound care appointment Thursday morning Change dressing daily RTO prn  Meds ordered this encounter  Medications  . HYDROcodone-acetaminophen (LORTAB) 5-325 MG tablet    Sig: Take 1 tablet every 6 (six) hours as needed by mouth for moderate pain.    Dispense:  40 tablet   Refill:  0    Order Specific Question:   Supervising Provider    Answer:   Eustaquio Maize [[id number]]     Mary-Margaret Hassell Done, FNP

## 2017-10-30 NOTE — Patient Instructions (Signed)
How to Change Your Dressing A dressing is a material that is placed in and over wounds. A dressing helps your wound to heal by protecting it from:  Bacteria.  Worse injury.  Being too dry or too wet.  What are the risks? The sticky (adhesive) tape that is used with a dressing may make your skin sore or irritated, or it may cause a rash. These are the most common problems. However, more serious problems can develop, such as:  Bleeding.  Infection.  How to change your dressing Getting Ready to Change Your Dressing   Take a shower before you do the first dressing change of the day. If your doctor does not want your wound to get wet and your dressing is not waterproof, you may need to put plastic leak-proof sealing wrap on your dressing to protect it.  If needed, take pain medicine as told by your doctor 30 minutes before you change your dressing.  Set up a clean station for wound care. You will need: ? A plastic trash bag that is open and ready to use. ? Hand sanitizer. ? Wound cleanser or salt-water solution (saline) as told by your doctor. ? New dressing material or bandages. Make sure to open the dressing package so the dressing stays on the inside of the package. You may also need these supplies in your clean station:  A box of vinyl gloves.  Tape.  Skin protectant. This may be a wipe, film, or spray.  Clean or germ-free (sterile) scissors.  A cotton-tipped applicator.  Taking Off Your Old Dressing  Wash your hands with soap and water. Dry your hands with a clean towel. If you cannot use soap and water, use hand sanitizer.  If you are using gloves, put on the gloves before you take off the dressing.  Gently take off any adhesive or tape by pulling it off in the direction of your hair growth. Only touch the outside edges of the dressing.  Take off the dressing. If the dressing sticks to your skin, wet the dressing with a germ-free salt-water solution. This helps it  come off more easily.  Take off any gauze or packing in your wound.  Throw the old dressing supplies into the ready trash bag.  Take off your gloves. To take off each glove, grab the cuff with your other hand and turn the glove inside out. Put the gloves in the trash right away.  Wash your hands with soap and water. Dry your hands with a clean towel. If you cannot use soap and water, use hand sanitizer. Cleaning Your Wound  Follow instructions from your doctor about how to clean your wound. This may include using a salt-water solution or recommended wound cleanser.  Do not use over-the-counter medicated or antiseptic creams, sprays, liquids, or dressings unless your doctor tells you to do that.  Use a clean gauze pad to clean the area fully with the salt-water solution or wound cleanser that your doctor recommends.  Throw the gauze pad into the trash bag.  Wash your hands with soap and water. Dry your hands with a clean towel. If you cannot use soap and water, use hand sanitizer. Putting on the Dressing  If your doctor recommended a skin protectant, put it on the skin around the wound.  Cover the wound with the recommended dressing, such as a nonstick gauze or bandage. Make sure to touch only the outside edges of the dressing. Do not touch the inside of the dressing.    Attach the dressing so all sides stay in place. You may do this with the attached medical adhesive, roll gauze, or tape. If you use tape, do not wrap the tape all the way around your arm or leg.  Take off your gloves. Put them in the trash bag with the old dressing. Tie the bag shut and throw it away.  Wash your hands with soap and water. Dry your hands with a clean towel. If you cannot use soap and water, use hand sanitizer. Get help if:   You have new pain.  You have irritation, a rash, or itching around the wound or dressing.  Changing your dressing is painful.  Changing your dressing causes a lot of  bleeding. Get help right away if:  You have very bad pain.  You have signs of infection, such as: ? More redness, swelling, or pain. ? More fluid or blood. ? Warmth. ? Pus or a bad smell. ? Red streaks leading from wound. ? A fever. This information is not intended to replace advice given to you by your health care provider. Make sure you discuss any questions you have with your health care provider. Document Released: 03/09/2009 Document Revised: 05/18/2016 Document Reviewed: 09/16/2015 Elsevier Interactive Patient Education  2018 [employer]  

## 2017-10-30 NOTE — Addendum Note (Signed)
Addended by: Chevis Pretty on: 10/30/2017 09:40 AM   Modules accepted: Orders

## 2017-11-01 ENCOUNTER — Other Ambulatory Visit: Payer: Self-pay

## 2017-11-01 ENCOUNTER — Ambulatory Visit (HOSPITAL_COMMUNITY): Payer: Medicare HMO | Attending: Nurse Practitioner | Admitting: Physical Therapy

## 2017-11-01 ENCOUNTER — Telehealth: Payer: Self-pay

## 2017-11-01 ENCOUNTER — Encounter (HOSPITAL_COMMUNITY): Payer: Self-pay | Admitting: Physical Therapy

## 2017-11-01 DIAGNOSIS — R262 Difficulty in walking, not elsewhere classified: Secondary | ICD-10-CM | POA: Insufficient documentation

## 2017-11-01 DIAGNOSIS — S81801D Unspecified open wound, right lower leg, subsequent encounter: Secondary | ICD-10-CM | POA: Diagnosis not present

## 2017-11-01 DIAGNOSIS — L03115 Cellulitis of right lower limb: Secondary | ICD-10-CM

## 2017-11-01 DIAGNOSIS — M79661 Pain in right lower leg: Secondary | ICD-10-CM

## 2017-11-01 DIAGNOSIS — X58XXXD Exposure to other specified factors, subsequent encounter: Secondary | ICD-10-CM | POA: Insufficient documentation

## 2017-11-01 DIAGNOSIS — S81802D Unspecified open wound, left lower leg, subsequent encounter: Secondary | ICD-10-CM

## 2017-11-01 MED ORDER — CIPROFLOXACIN HCL 500 MG PO TABS: 500.0000 mg | ORAL_TABLET | Freq: Two times a day (BID) | ORAL | 0 refills | Status: DC

## 2017-11-01 NOTE — Telephone Encounter (Signed)
Spoke with patient and advised that a referral had been made for a general surgeon for her leg. Advised our referral dept would contact her in the AM with appt and time. Patient verbalized understanding.

## 2017-11-01 NOTE — Therapy (Signed)
Emily Wall, [place], [address] Phone: [phone]   Fax:  [phone]  Wound Care Evaluation  Patient Details  Name: Emily Wall MRN: [id number] Date of Birth: 05-Jan-1936 Referring Provider: Chevis Wall   Encounter Date: 11/01/2017  PT End of Session - 11/01/17 1645    Visit Number  1    Number of Visits  12    Date for PT Re-Evaluation  12/01/17    Authorization Type  [employer] medicare    Authorization - Visit Number  1    Authorization - Number of Visits  10    PT Start Time  0945    PT Stop Time  1025    PT Time Calculation (min)  40 min    Activity Tolerance  Patient limited by pain    Behavior During Therapy  Emily Wall for tasks assessed/performed       Past Medical History:  Diagnosis Date  . Aortic regurgitation    Mild  . Aortic stenosis    Moderate  . Coronary atherosclerosis of native coronary artery    Nonobstructive 2007  . Essential hypertension, benign   . Hyperlipidemia   . Hypothyroidism   . Nonischemic cardiomyopathy (HCC)    LVEF improved to 50-55%  . Osteoporosis   . Type 2 diabetes mellitus ([place])     Past Surgical History:  Procedure Laterality Date  . SKIN GRAFT      There were no vitals filed for this visit.    Emily Wall PT Assessment - 11/01/17 0001      Assessment   Medical Diagnosis  non-healing wound    Referring Provider  Emily Wall    Onset Date/Surgical Date  10/08/17    Next MD Visit  unknown    Prior Therapy  self care, antibiotics,       Precautions   Precautions  None      Restrictions   Weight Bearing Restrictions  No      Balance Screen   Has the patient fallen in the past 6 months  No    Has the patient had a decrease in activity level because of a fear of falling?   No    Is the patient reluctant to leave their home because of a fear of falling?   No      Home Environment   Living Environment  Private residence      Prior  Function   Level of Independence  Independent      Cognition   Overall Cognitive Status  Within Functional Limits for tasks assessed      Wound Therapy - 11/01/17 1630    Subjective  [name] states that she had a skin graft on her right LE years ago.  She has had no difficulty until lately when a blister appeared on the anterior aspect of her Rt leg which continues to spread to the back of her leg.      Patient and Family Stated Goals  wounds to heal    Date of Onset  10/08/17    Prior Treatments  antibiotics, self drtessing     Pain Assessment  0-10    Pain Score  5     Pain Type  Acute pain    Pain Location  Leg    Pain Orientation  Right;Posterior    Pain Descriptors / Indicators  Aching    Pain Onset  On-going    Patients Stated Pain Goal  0    Pain Intervention(s)  Emotional support    Evaluation and Treatment Procedures Explained to Patient/Family  Yes    Evaluation and Treatment Procedures  agreed to    Wound Properties Date First Assessed: 11/01/17 Time First Assessed: 1200   Dressing Type  Gauze (Comment)    Dressing Changed  Changed    Dressing Status  Old drainage    Dressing Change Frequency  PRN    % Wound base Red or Granulating  5%    % Wound base Yellow/Fibrinous Exudate  15%    % Wound base Black/Eschar  80%    Peri-wound Assessment  Edema;Erythema (blanchable)    Wound Length (cm)  12 cm    Wound Width (cm)  12 cm    Wound Depth (cm)  -- unknown     Wound Surface Area (cm^2)  144 cm^2    Drainage Amount  Copious    Drainage Description  Purulent;No odor    Treatment  Cleansed;Debridement (Selective)    Selective Debridement - Location  bladed wound    Selective Debridement - Tools Used  Forceps;Scalpel    Selective Debridement - Tissue Removed  slough/eschar     Wound Therapy - Clinical Statement  [name] is unsure what started her wound.  She was initially referred to the wound [facility] in [place] but there was no response thererfore her MD office  referred her to physical therapy.  Upon examination of the wound her gastrocnemius tendon is exposed  7cm x 5cm just superior to this she has a wound measuring 12 x 12 that is mainly eschar.  There are a few small openings along the anterior of her leg.  The therapist explained to the pt that she really needed to see a surgeon due to her tendon being exposed.  The wound [facility] in [place] was contacted and they no longer have a Psychologist, sport and exercise working there.  The therapist attempted to call [facility] to suggest referring the pt to the wound [facility] in [place] but there was no answer.   We will attempth this again tomorrow.  [name] will need medical care until she can get in to see a surgical consult; therefore we will continue to see her three times a week until she is seen by the surgeon.     Wound Therapy - Functional Problem List  difficulty walking due to pain,  difficult lying her leg down on a bed due to pain.      Factors Delaying/Impairing Wound Healing  Altered sensation;Diabetes Mellitus;Infection - systemic/local;Polypharmacy;Vascular compromise    Hydrotherapy Plan  Debridement;Dressing change;Patient/family education    Wound Therapy - Frequency  3X / week x 4 weeks     Wound Therapy - Current Recommendations  PT;Surgery consult    Wound Plan  debridement and dressing change; assess how pt responds to profore lite dressing    Dressing   hydrogel and 4x4 to achilles tendon;    Dressing  silverhydrofiber to posterior aspect of LE     Dressing  xeroform to anterior aspect of LE  profore lite to LE              Objective measurements completed on examination: See above findings.            PT Education - 11/01/17 1644    Education provided  Yes    Education Details  if dressing becomes saturated remove; cleanse wound and redress.     Person(s) Educated  Patient  Methods  Explanation    Comprehension  Verbalized understanding       PT Short Term Goals - 2017/11/22  1648      PT SHORT TERM GOAL #1   Title  Pt wound drainage to decrease to moderate to demonstrate decreased infection     Time  2    Period  Weeks    Status  New    Target Date  11/15/17      PT SHORT TERM GOAL #2   Title  PT to have 40% granulation to decrease risk of cellulitis     Time  2    Period  Weeks    Status  New      PT SHORT TERM GOAL #3   Title  Pt to verbalize the need to keep exposed tendon moist and covered    Time  1    Period  Weeks    Status  New    Target Date  11/08/17        PT Long Term Goals - 2017-11-22 1650      PT LONG TERM GOAL #1   Title  PT to have been to seen a surgical consult    Time  4    Period  Weeks    Status  New    Target Date  11/29/17      PT LONG TERM GOAL #2   Title  Pt drainage to be minimal to reduce risk of infection     Time  4    Period  Weeks    Status  New      PT LONG TERM GOAL #3   Title  Pt pain level to be no greater than a 2/10 to allow pt to walk for 10 minutes in comfort     Time  4    Period  Weeks    Status  New      PT LONG TERM GOAL #4   Title  Pt wound to be 60% granulated to reduce risk of infection     Time  4    Period  Weeks    Status  New           Plan - 11-22-17 1646    Clinical Impression Statement  as above     Clinical Presentation  Stable    Rehab Potential  Fair refer to surgeon.     PT Frequency  3x / week    PT Duration  4 weeks    PT Treatment/Interventions  Other (comment) debridement and dressing change        Patient will benefit from skilled therapeutic intervention in order to improve the following deficits and impairments:  Other (comment), Pain, Difficulty walking(non healing wound with tendon exposure )  Visit Diagnosis: Pain in right lower leg  Difficulty in walking, not elsewhere classified  Leg wound, right, subsequent encounter  G-Codes - 11-22-2017 1653    Functional Assessment Tool Used (Outpatient Only)  % granulation     Functional Limitation  Other  PT primary    Other PT Primary Current Status (S5681)  At least 80 percent but less than 100 percent impaired, limited or restricted    Other PT Primary Goal Status (E7517)  At least 40 percent but less than 60 percent impaired, limited or restricted       Problem List Patient Active Problem List   Diagnosis Date Noted  . BMI 26.0-26.9,adult 11/15/2015  . Family history of malignant  neoplasm of gastrointestinal tract 06/30/2013  . Hyperlipidemia 06/02/2013  . Diabetes mellitus ([place]) 03/29/2011  . Osteoporosis 03/29/2011  . Iron deficiency anemia 12/29/2010  . Essential hypertension 09/08/2009  . Aortic valve disorder 09/08/2009  . Secondary cardiomyopathy [facility]) 09/08/2009    Rayetta Humphrey, PT CLT [phone] 11/01/2017, 4:54 PM  [facility] [address] [place], [place], [address] Phone: [phone]   Fax:  [phone]  Name: Emily Wall MRN: [id number] Date of Birth: 1936-05-30

## 2017-11-02 ENCOUNTER — Inpatient Hospital Stay (HOSPITAL_COMMUNITY)
Admission: EM | Admit: 2017-11-02 | Discharge: 2017-11-14 | DRG: 264 | Disposition: A | Discharge status: Home Health | Payer: Medicare HMO | Attending: Internal Medicine | Admitting: Internal Medicine

## 2017-11-02 ENCOUNTER — Telehealth: Payer: Self-pay | Admitting: Pediatrics

## 2017-11-02 ENCOUNTER — Other Ambulatory Visit: Payer: Self-pay

## 2017-11-02 ENCOUNTER — Encounter (HOSPITAL_COMMUNITY): Payer: Self-pay

## 2017-11-02 DIAGNOSIS — Z7982 Long term (current) use of aspirin: Secondary | ICD-10-CM

## 2017-11-02 DIAGNOSIS — I359 Nonrheumatic aortic valve disorder, unspecified: Secondary | ICD-10-CM | POA: Diagnosis present

## 2017-11-02 DIAGNOSIS — E1152 Type 2 diabetes mellitus with diabetic peripheral angiopathy with gangrene: Secondary | ICD-10-CM | POA: Diagnosis not present

## 2017-11-02 DIAGNOSIS — L97315 Non-pressure chronic ulcer of right ankle with muscle involvement without evidence of necrosis: Secondary | ICD-10-CM | POA: Diagnosis not present

## 2017-11-02 DIAGNOSIS — E8809 Other disorders of plasma-protein metabolism, not elsewhere classified: Secondary | ICD-10-CM | POA: Diagnosis not present

## 2017-11-02 DIAGNOSIS — I70238 Atherosclerosis of native arteries of right leg with ulceration of other part of lower right leg: Secondary | ICD-10-CM | POA: Diagnosis not present

## 2017-11-02 DIAGNOSIS — I10 Essential (primary) hypertension: Secondary | ICD-10-CM | POA: Diagnosis not present

## 2017-11-02 DIAGNOSIS — E440 Moderate protein-calorie malnutrition: Secondary | ICD-10-CM | POA: Diagnosis present

## 2017-11-02 DIAGNOSIS — Z87891 Personal history of nicotine dependence: Secondary | ICD-10-CM | POA: Diagnosis not present

## 2017-11-02 DIAGNOSIS — E11622 Type 2 diabetes mellitus with other skin ulcer: Secondary | ICD-10-CM | POA: Diagnosis present

## 2017-11-02 DIAGNOSIS — I2510 Atherosclerotic heart disease of native coronary artery without angina pectoris: Secondary | ICD-10-CM | POA: Diagnosis present

## 2017-11-02 DIAGNOSIS — E785 Hyperlipidemia, unspecified: Secondary | ICD-10-CM | POA: Diagnosis not present

## 2017-11-02 DIAGNOSIS — L97219 Non-pressure chronic ulcer of right calf with unspecified severity: Secondary | ICD-10-CM | POA: Diagnosis present

## 2017-11-02 DIAGNOSIS — L97308 Non-pressure chronic ulcer of unspecified ankle with other specified severity: Secondary | ICD-10-CM | POA: Diagnosis not present

## 2017-11-02 DIAGNOSIS — S91001A Unspecified open wound, right ankle, initial encounter: Secondary | ICD-10-CM | POA: Diagnosis not present

## 2017-11-02 DIAGNOSIS — I70261 Atherosclerosis of native arteries of extremities with gangrene, right leg: Secondary | ICD-10-CM

## 2017-11-02 DIAGNOSIS — T148XXA Other injury of unspecified body region, initial encounter: Secondary | ICD-10-CM

## 2017-11-02 DIAGNOSIS — Z79899 Other long term (current) drug therapy: Secondary | ICD-10-CM

## 2017-11-02 DIAGNOSIS — L03115 Cellulitis of right lower limb: Secondary | ICD-10-CM | POA: Diagnosis not present

## 2017-11-02 DIAGNOSIS — I352 Nonrheumatic aortic (valve) stenosis with insufficiency: Secondary | ICD-10-CM | POA: Diagnosis present

## 2017-11-02 DIAGNOSIS — T8149XA Infection following a procedure, other surgical site, initial encounter: Secondary | ICD-10-CM | POA: Diagnosis not present

## 2017-11-02 DIAGNOSIS — I83009 Varicose veins of unspecified lower extremity with ulcer of unspecified site: Secondary | ICD-10-CM | POA: Diagnosis present

## 2017-11-02 DIAGNOSIS — Z6825 Body mass index (BMI) 25.0-25.9, adult: Secondary | ICD-10-CM

## 2017-11-02 DIAGNOSIS — M810 Age-related osteoporosis without current pathological fracture: Secondary | ICD-10-CM | POA: Diagnosis present

## 2017-11-02 DIAGNOSIS — E119 Type 2 diabetes mellitus without complications: Secondary | ICD-10-CM | POA: Diagnosis not present

## 2017-11-02 DIAGNOSIS — L97309 Non-pressure chronic ulcer of unspecified ankle with unspecified severity: Secondary | ICD-10-CM | POA: Diagnosis not present

## 2017-11-02 DIAGNOSIS — E11621 Type 2 diabetes mellitus with foot ulcer: Secondary | ICD-10-CM | POA: Diagnosis present

## 2017-11-02 DIAGNOSIS — I428 Other cardiomyopathies: Secondary | ICD-10-CM | POA: Diagnosis not present

## 2017-11-02 DIAGNOSIS — I5042 Chronic combined systolic (congestive) and diastolic (congestive) heart failure: Secondary | ICD-10-CM | POA: Diagnosis present

## 2017-11-02 DIAGNOSIS — D638 Anemia in other chronic diseases classified elsewhere: Secondary | ICD-10-CM | POA: Diagnosis present

## 2017-11-02 DIAGNOSIS — Z794 Long term (current) use of insulin: Secondary | ICD-10-CM | POA: Diagnosis not present

## 2017-11-02 DIAGNOSIS — L97319 Non-pressure chronic ulcer of right ankle with unspecified severity: Secondary | ICD-10-CM | POA: Diagnosis not present

## 2017-11-02 DIAGNOSIS — L97909 Non-pressure chronic ulcer of unspecified part of unspecified lower leg with unspecified severity: Secondary | ICD-10-CM | POA: Diagnosis not present

## 2017-11-02 DIAGNOSIS — L089 Local infection of the skin and subcutaneous tissue, unspecified: Secondary | ICD-10-CM | POA: Diagnosis present

## 2017-11-02 DIAGNOSIS — I110 Hypertensive heart disease with heart failure: Secondary | ICD-10-CM | POA: Diagnosis present

## 2017-11-02 DIAGNOSIS — L03119 Cellulitis of unspecified part of limb: Secondary | ICD-10-CM | POA: Diagnosis not present

## 2017-11-02 DIAGNOSIS — I83013 Varicose veins of right lower extremity with ulcer of ankle: Secondary | ICD-10-CM | POA: Diagnosis not present

## 2017-11-02 DIAGNOSIS — S91009A Unspecified open wound, unspecified ankle, initial encounter: Secondary | ICD-10-CM

## 2017-11-02 DIAGNOSIS — E039 Hypothyroidism, unspecified: Secondary | ICD-10-CM | POA: Diagnosis present

## 2017-11-02 DIAGNOSIS — IMO0002 Reserved for concepts with insufficient information to code with codable children: Secondary | ICD-10-CM

## 2017-11-02 DIAGNOSIS — S81801A Unspecified open wound, right lower leg, initial encounter: Secondary | ICD-10-CM | POA: Diagnosis not present

## 2017-11-02 DIAGNOSIS — I83003 Varicose veins of unspecified lower extremity with ulcer of ankle: Secondary | ICD-10-CM | POA: Diagnosis not present

## 2017-11-02 DIAGNOSIS — D509 Iron deficiency anemia, unspecified: Secondary | ICD-10-CM | POA: Diagnosis not present

## 2017-11-02 LAB — COMPREHENSIVE METABOLIC PANEL
ALT: 10 U/L — AB (ref 14–54)
AST: 15 U/L (ref 15–41)
Albumin: 2.6 g/dL — ABNORMAL LOW (ref 3.5–5.0)
Alkaline Phosphatase: 54 U/L (ref 38–126)
Anion gap: 9 (ref 5–15)
BUN: 7 mg/dL (ref 6–20)
CHLORIDE: 99 mmol/L — AB (ref 101–111)
CO2: 23 mmol/L (ref 22–32)
CREATININE: 0.71 mg/dL (ref 0.44–1.00)
Calcium: 8.1 mg/dL — ABNORMAL LOW (ref 8.9–10.3)
Glucose, Bld: 165 mg/dL — ABNORMAL HIGH (ref 65–99)
Potassium: 3.5 mmol/L (ref 3.5–5.1)
Sodium: 131 mmol/L — ABNORMAL LOW (ref 135–145)
Total Bilirubin: 0.6 mg/dL (ref 0.3–1.2)
Total Protein: 6.1 g/dL — ABNORMAL LOW (ref 6.5–8.1)

## 2017-11-02 LAB — CBC WITH DIFFERENTIAL/PLATELET
BASOS ABS: 0 10*3/uL (ref 0.0–0.1)
BASOS PCT: 0 %
EOS ABS: 0.4 10*3/uL (ref 0.0–0.7)
Eosinophils Relative: 7 %
HCT: 31.9 % — ABNORMAL LOW (ref 36.0–46.0)
HEMOGLOBIN: 10.4 g/dL — AB (ref 12.0–15.0)
LYMPHS ABS: 1.2 10*3/uL (ref 0.7–4.0)
Lymphocytes Relative: 23 %
MCH: 29.8 pg (ref 26.0–34.0)
MCHC: 32.6 g/dL (ref 30.0–36.0)
MCV: 91.4 fL (ref 78.0–100.0)
Monocytes Absolute: 0.4 10*3/uL (ref 0.1–1.0)
Monocytes Relative: 9 %
NEUTROS PCT: 61 %
Neutro Abs: 3 10*3/uL (ref 1.7–7.7)
Platelets: 202 10*3/uL (ref 150–400)
RBC: 3.49 MIL/uL — AB (ref 3.87–5.11)
RDW: 14.6 % (ref 11.5–15.5)
WBC: 5 10*3/uL (ref 4.0–10.5)

## 2017-11-02 MED ORDER — VANCOMYCIN HCL IN DEXTROSE 1-5 GM/200ML-% IV SOLN
1000.0000 mg | Freq: Once | INTRAVENOUS | Status: AC
  Administered 2017-11-03: 1000 mg via INTRAVENOUS
  Filled 2017-11-02: qty 200

## 2017-11-02 NOTE — ED Triage Notes (Signed)
Pt sent here by wound clinic for possible "torn tendon in the back of my right leg" pt has been on antibiotics for wound on right lower leg since last week. VSS. Afebrile.

## 2017-11-02 NOTE — Telephone Encounter (Signed)
Called [facility] and spoke to Dr. Evette Doffing re therapist feels that Ms. Behan needs to be seen by a Psychologist, sport and exercise.  Dr. Evette Doffing stated that she would refer pt and attempt to get them seen as soon as possible.    Rayetta Humphrey, [facility] [facility] CLT [phone]

## 2017-11-02 NOTE — Telephone Encounter (Signed)
PT called, surgical referral already in

## 2017-11-02 NOTE — Telephone Encounter (Signed)
Per scheduling with [facility], will need to be seen in ED for [facility] to do evaluation, not able to do the evaluation in office. Discussed with daughter, will take her to [facility] or [facility] for surgical eval.

## 2017-11-03 ENCOUNTER — Inpatient Hospital Stay (HOSPITAL_COMMUNITY): Payer: Medicare HMO

## 2017-11-03 ENCOUNTER — Other Ambulatory Visit: Payer: Self-pay

## 2017-11-03 DIAGNOSIS — I83003 Varicose veins of unspecified lower extremity with ulcer of ankle: Secondary | ICD-10-CM | POA: Diagnosis not present

## 2017-11-03 DIAGNOSIS — E11621 Type 2 diabetes mellitus with foot ulcer: Secondary | ICD-10-CM | POA: Diagnosis present

## 2017-11-03 DIAGNOSIS — E11622 Type 2 diabetes mellitus with other skin ulcer: Secondary | ICD-10-CM

## 2017-11-03 DIAGNOSIS — Z7982 Long term (current) use of aspirin: Secondary | ICD-10-CM | POA: Diagnosis not present

## 2017-11-03 DIAGNOSIS — T8149XA Infection following a procedure, other surgical site, initial encounter: Secondary | ICD-10-CM | POA: Diagnosis not present

## 2017-11-03 DIAGNOSIS — I70238 Atherosclerosis of native arteries of right leg with ulceration of other part of lower right leg: Secondary | ICD-10-CM | POA: Diagnosis not present

## 2017-11-03 DIAGNOSIS — L03119 Cellulitis of unspecified part of limb: Secondary | ICD-10-CM

## 2017-11-03 DIAGNOSIS — T148XXA Other injury of unspecified body region, initial encounter: Secondary | ICD-10-CM | POA: Diagnosis not present

## 2017-11-03 DIAGNOSIS — L97309 Non-pressure chronic ulcer of unspecified ankle with unspecified severity: Secondary | ICD-10-CM

## 2017-11-03 DIAGNOSIS — L97219 Non-pressure chronic ulcer of right calf with unspecified severity: Secondary | ICD-10-CM | POA: Diagnosis present

## 2017-11-03 DIAGNOSIS — I83009 Varicose veins of unspecified lower extremity with ulcer of unspecified site: Secondary | ICD-10-CM | POA: Diagnosis present

## 2017-11-03 DIAGNOSIS — I2510 Atherosclerotic heart disease of native coronary artery without angina pectoris: Secondary | ICD-10-CM | POA: Diagnosis present

## 2017-11-03 DIAGNOSIS — L089 Local infection of the skin and subcutaneous tissue, unspecified: Secondary | ICD-10-CM | POA: Diagnosis present

## 2017-11-03 DIAGNOSIS — I359 Nonrheumatic aortic valve disorder, unspecified: Secondary | ICD-10-CM

## 2017-11-03 DIAGNOSIS — L97308 Non-pressure chronic ulcer of unspecified ankle with other specified severity: Secondary | ICD-10-CM

## 2017-11-03 DIAGNOSIS — E119 Type 2 diabetes mellitus without complications: Secondary | ICD-10-CM

## 2017-11-03 DIAGNOSIS — D638 Anemia in other chronic diseases classified elsewhere: Secondary | ICD-10-CM | POA: Diagnosis present

## 2017-11-03 DIAGNOSIS — L97909 Non-pressure chronic ulcer of unspecified part of unspecified lower leg with unspecified severity: Secondary | ICD-10-CM

## 2017-11-03 DIAGNOSIS — E440 Moderate protein-calorie malnutrition: Secondary | ICD-10-CM | POA: Diagnosis present

## 2017-11-03 DIAGNOSIS — L97315 Non-pressure chronic ulcer of right ankle with muscle involvement without evidence of necrosis: Secondary | ICD-10-CM | POA: Diagnosis not present

## 2017-11-03 DIAGNOSIS — Z794 Long term (current) use of insulin: Secondary | ICD-10-CM | POA: Diagnosis not present

## 2017-11-03 DIAGNOSIS — E1152 Type 2 diabetes mellitus with diabetic peripheral angiopathy with gangrene: Secondary | ICD-10-CM | POA: Diagnosis present

## 2017-11-03 DIAGNOSIS — Z87891 Personal history of nicotine dependence: Secondary | ICD-10-CM | POA: Diagnosis not present

## 2017-11-03 DIAGNOSIS — I428 Other cardiomyopathies: Secondary | ICD-10-CM | POA: Diagnosis present

## 2017-11-03 DIAGNOSIS — E8809 Other disorders of plasma-protein metabolism, not elsewhere classified: Secondary | ICD-10-CM | POA: Diagnosis present

## 2017-11-03 DIAGNOSIS — L03115 Cellulitis of right lower limb: Secondary | ICD-10-CM | POA: Diagnosis present

## 2017-11-03 DIAGNOSIS — I10 Essential (primary) hypertension: Secondary | ICD-10-CM | POA: Diagnosis not present

## 2017-11-03 DIAGNOSIS — I70261 Atherosclerosis of native arteries of extremities with gangrene, right leg: Secondary | ICD-10-CM | POA: Diagnosis present

## 2017-11-03 DIAGNOSIS — L97319 Non-pressure chronic ulcer of right ankle with unspecified severity: Secondary | ICD-10-CM | POA: Diagnosis present

## 2017-11-03 DIAGNOSIS — Z79899 Other long term (current) drug therapy: Secondary | ICD-10-CM | POA: Diagnosis not present

## 2017-11-03 DIAGNOSIS — E785 Hyperlipidemia, unspecified: Secondary | ICD-10-CM | POA: Diagnosis present

## 2017-11-03 DIAGNOSIS — E039 Hypothyroidism, unspecified: Secondary | ICD-10-CM | POA: Diagnosis present

## 2017-11-03 DIAGNOSIS — M810 Age-related osteoporosis without current pathological fracture: Secondary | ICD-10-CM | POA: Diagnosis present

## 2017-11-03 DIAGNOSIS — I5042 Chronic combined systolic (congestive) and diastolic (congestive) heart failure: Secondary | ICD-10-CM | POA: Diagnosis present

## 2017-11-03 DIAGNOSIS — I352 Nonrheumatic aortic (valve) stenosis with insufficiency: Secondary | ICD-10-CM | POA: Diagnosis present

## 2017-11-03 DIAGNOSIS — I83013 Varicose veins of right lower extremity with ulcer of ankle: Secondary | ICD-10-CM | POA: Diagnosis not present

## 2017-11-03 DIAGNOSIS — Z6825 Body mass index (BMI) 25.0-25.9, adult: Secondary | ICD-10-CM | POA: Diagnosis not present

## 2017-11-03 DIAGNOSIS — I110 Hypertensive heart disease with heart failure: Secondary | ICD-10-CM | POA: Diagnosis present

## 2017-11-03 LAB — CBC WITH DIFFERENTIAL/PLATELET
BASOS PCT: 0 %
Basophils Absolute: 0 10*3/uL (ref 0.0–0.1)
EOS ABS: 0.3 10*3/uL (ref 0.0–0.7)
Eosinophils Relative: 5 %
HCT: 31.6 % — ABNORMAL LOW (ref 36.0–46.0)
HEMOGLOBIN: 10.2 g/dL — AB (ref 12.0–15.0)
Lymphocytes Relative: 21 %
Lymphs Abs: 1 10*3/uL (ref 0.7–4.0)
MCH: 29.7 pg (ref 26.0–34.0)
MCHC: 32.3 g/dL (ref 30.0–36.0)
MCV: 91.9 fL (ref 78.0–100.0)
Monocytes Absolute: 0.4 10*3/uL (ref 0.1–1.0)
Monocytes Relative: 8 %
NEUTROS PCT: 66 %
Neutro Abs: 3.1 10*3/uL (ref 1.7–7.7)
Platelets: 208 10*3/uL (ref 150–400)
RBC: 3.44 MIL/uL — AB (ref 3.87–5.11)
RDW: 14.5 % (ref 11.5–15.5)
WBC: 4.8 10*3/uL (ref 4.0–10.5)

## 2017-11-03 LAB — GLUCOSE, CAPILLARY
Glucose-Capillary: 133 mg/dL — ABNORMAL HIGH (ref 65–99)
Glucose-Capillary: 136 mg/dL — ABNORMAL HIGH (ref 65–99)
Glucose-Capillary: 143 mg/dL — ABNORMAL HIGH (ref 65–99)
Glucose-Capillary: 143 mg/dL — ABNORMAL HIGH (ref 65–99)

## 2017-11-03 LAB — SEDIMENTATION RATE: Sed Rate: 26 mm/h — ABNORMAL HIGH (ref 0–22)

## 2017-11-03 LAB — C-REACTIVE PROTEIN: CRP: 1.8 mg/dL — AB (ref <1.0)

## 2017-11-03 LAB — COMPREHENSIVE METABOLIC PANEL WITH GFR
ALT: 10 U/L — ABNORMAL LOW (ref 14–54)
AST: 14 U/L — ABNORMAL LOW (ref 15–41)
Albumin: 2.4 g/dL — ABNORMAL LOW (ref 3.5–5.0)
Alkaline Phosphatase: 48 U/L (ref 38–126)
Anion gap: 5 (ref 5–15)
BUN: 6 mg/dL (ref 6–20)
CO2: 28 mmol/L (ref 22–32)
Calcium: 8 mg/dL — ABNORMAL LOW (ref 8.9–10.3)
Chloride: 100 mmol/L — ABNORMAL LOW (ref 101–111)
Creatinine, Ser: 0.63 mg/dL (ref 0.44–1.00)
GFR calc Af Amer: >60 mL/min
GFR calc non Af Amer: >60 mL/min
Glucose, Bld: 136 mg/dL — ABNORMAL HIGH (ref 65–99)
Potassium: 3.8 mmol/L (ref 3.5–5.1)
Sodium: 133 mmol/L — ABNORMAL LOW (ref 135–145)
Total Bilirubin: 0.5 mg/dL (ref 0.3–1.2)
Total Protein: 5.6 g/dL — ABNORMAL LOW (ref 6.5–8.1)

## 2017-11-03 LAB — PREALBUMIN: Prealbumin: 6.1 mg/dL — ABNORMAL LOW (ref 18–38)

## 2017-11-03 LAB — HEMOGLOBIN A1C
Hgb A1c MFr Bld: 8.1 % — ABNORMAL HIGH (ref 4.8–5.6)
Mean Plasma Glucose: 185.77 mg/dL

## 2017-11-03 LAB — MAGNESIUM: Magnesium: 1.5 mg/dL — ABNORMAL LOW (ref 1.7–2.4)

## 2017-11-03 LAB — PHOSPHORUS: Phosphorus: 3 mg/dL (ref 2.5–4.6)

## 2017-11-03 MED ORDER — FERROUS SULFATE 325 (65 FE) MG PO TABS
325.0000 mg | ORAL_TABLET | Freq: Every day | ORAL | Status: DC
  Administered 2017-11-03 – 2017-11-14 (×11): 325 mg via ORAL
  Filled 2017-11-03 (×11): qty 1

## 2017-11-03 MED ORDER — FUROSEMIDE 20 MG PO TABS
20.0000 mg | ORAL_TABLET | Freq: Every day | ORAL | Status: DC
  Administered 2017-11-03 – 2017-11-07 (×5): 20 mg via ORAL
  Filled 2017-11-03 (×6): qty 1

## 2017-11-03 MED ORDER — DEXTROSE 5 % IV SOLN
1.0000 g | Freq: Three times a day (TID) | INTRAVENOUS | Status: DC
  Administered 2017-11-03 – 2017-11-11 (×24): 1 g via INTRAVENOUS
  Filled 2017-11-03 (×25): qty 1

## 2017-11-03 MED ORDER — ENOXAPARIN SODIUM 40 MG/0.4ML ~~LOC~~ SOLN
40.0000 mg | SUBCUTANEOUS | Status: DC
  Administered 2017-11-03 – 2017-11-14 (×10): 40 mg via SUBCUTANEOUS
  Filled 2017-11-03 (×10): qty 0.4

## 2017-11-03 MED ORDER — INSULIN ASPART 100 UNIT/ML ~~LOC~~ SOLN
0.0000 [IU] | Freq: Every day | SUBCUTANEOUS | Status: DC
Start: 2017-11-03 — End: 2017-11-14
  Administered 2017-11-07: 2 [IU] via SUBCUTANEOUS

## 2017-11-03 MED ORDER — ATORVASTATIN CALCIUM 20 MG PO TABS
20.0000 mg | ORAL_TABLET | Freq: Every day | ORAL | Status: DC
  Administered 2017-11-03 – 2017-11-14 (×11): 20 mg via ORAL
  Filled 2017-11-03 (×11): qty 1

## 2017-11-03 MED ORDER — PRO-STAT SUGAR FREE PO LIQD
30.0000 mL | Freq: Two times a day (BID) | ORAL | Status: DC
  Administered 2017-11-04 – 2017-11-14 (×20): 30 mL via ORAL
  Filled 2017-11-03 (×20): qty 30

## 2017-11-03 MED ORDER — INSULIN ASPART 100 UNIT/ML ~~LOC~~ SOLN
0.0000 [IU] | Freq: Three times a day (TID) | SUBCUTANEOUS | Status: DC
  Administered 2017-11-03 – 2017-11-05 (×5): 1 [IU] via SUBCUTANEOUS
  Administered 2017-11-06: 2 [IU] via SUBCUTANEOUS
  Administered 2017-11-06 – 2017-11-07 (×2): 1 [IU] via SUBCUTANEOUS
  Administered 2017-11-07 – 2017-11-08 (×3): 2 [IU] via SUBCUTANEOUS
  Administered 2017-11-08: 1 [IU] via SUBCUTANEOUS
  Administered 2017-11-09 (×2): 2 [IU] via SUBCUTANEOUS
  Administered 2017-11-09: 1 [IU] via SUBCUTANEOUS
  Administered 2017-11-10: 3 [IU] via SUBCUTANEOUS
  Administered 2017-11-10 – 2017-11-11 (×3): 2 [IU] via SUBCUTANEOUS
  Administered 2017-11-11: 1 [IU] via SUBCUTANEOUS
  Administered 2017-11-11 – 2017-11-12 (×2): 2 [IU] via SUBCUTANEOUS
  Administered 2017-11-12: 1 [IU] via SUBCUTANEOUS
  Administered 2017-11-12: 2 [IU] via SUBCUTANEOUS
  Administered 2017-11-13: 1 [IU] via SUBCUTANEOUS
  Administered 2017-11-14: 2 [IU] via SUBCUTANEOUS

## 2017-11-03 MED ORDER — LISINOPRIL 20 MG PO TABS
40.0000 mg | ORAL_TABLET | Freq: Every day | ORAL | Status: DC
  Administered 2017-11-03 – 2017-11-07 (×5): 40 mg via ORAL
  Filled 2017-11-03 (×6): qty 2

## 2017-11-03 MED ORDER — ASPIRIN 325 MG PO TABS
325.0000 mg | ORAL_TABLET | Freq: Every day | ORAL | Status: DC
  Administered 2017-11-03 – 2017-11-14 (×11): 325 mg via ORAL
  Filled 2017-11-03 (×11): qty 1

## 2017-11-03 MED ORDER — CEFEPIME HCL 2 G IJ SOLR
2.0000 g | Freq: Once | INTRAMUSCULAR | Status: AC
  Administered 2017-11-03: 2 g via INTRAVENOUS
  Filled 2017-11-03: qty 2

## 2017-11-03 MED ORDER — JUVEN PO PACK
1.0000 | PACK | Freq: Two times a day (BID) | ORAL | Status: DC
  Administered 2017-11-04 – 2017-11-14 (×15): 1 via ORAL
  Filled 2017-11-03 (×24): qty 1

## 2017-11-03 MED ORDER — MAGNESIUM SULFATE 2 GM/50ML IV SOLN
2.0000 g | Freq: Once | INTRAVENOUS | Status: AC
  Administered 2017-11-03: 2 g via INTRAVENOUS
  Filled 2017-11-03: qty 50

## 2017-11-03 MED ORDER — HYDROCODONE-ACETAMINOPHEN 5-325 MG PO TABS
1.0000 | ORAL_TABLET | ORAL | Status: DC | PRN
  Administered 2017-11-03 – 2017-11-14 (×21): 1 via ORAL
  Filled 2017-11-03 (×23): qty 1

## 2017-11-03 MED ORDER — FUROSEMIDE 20 MG PO TABS: 20.0000 mg | ORAL_TABLET | Freq: Every day | ORAL | Status: DC | PRN

## 2017-11-03 MED ORDER — CARVEDILOL 25 MG PO TABS
25.0000 mg | ORAL_TABLET | Freq: Two times a day (BID) | ORAL | Status: DC
  Administered 2017-11-03 – 2017-11-14 (×22): 25 mg via ORAL
  Filled 2017-11-03: qty 2
  Filled 2017-11-03 (×2): qty 1
  Filled 2017-11-03: qty 2
  Filled 2017-11-03: qty 1
  Filled 2017-11-03 (×2): qty 2
  Filled 2017-11-03: qty 1
  Filled 2017-11-03: qty 2
  Filled 2017-11-03 (×3): qty 1
  Filled 2017-11-03: qty 2
  Filled 2017-11-03: qty 1
  Filled 2017-11-03 (×2): qty 2
  Filled 2017-11-03: qty 1
  Filled 2017-11-03 (×5): qty 2

## 2017-11-03 MED ORDER — SODIUM CHLORIDE 0.9 % IV SOLN
1250.0000 mg | INTRAVENOUS | Status: DC
  Administered 2017-11-03 – 2017-11-10 (×8): 1250 mg via INTRAVENOUS
  Filled 2017-11-03 (×8): qty 1250

## 2017-11-03 MED ORDER — ONDANSETRON HCL 4 MG/2ML IJ SOLN
4.0000 mg | Freq: Four times a day (QID) | INTRAMUSCULAR | Status: DC | PRN
  Filled 2017-11-03: qty 2

## 2017-11-03 MED ORDER — METRONIDAZOLE IN NACL 5-0.79 MG/ML-% IV SOLN
500.0000 mg | Freq: Three times a day (TID) | INTRAVENOUS | Status: DC
  Administered 2017-11-03 – 2017-11-11 (×26): 500 mg via INTRAVENOUS
  Filled 2017-11-03 (×27): qty 100

## 2017-11-03 MED ORDER — HYDROCODONE-ACETAMINOPHEN 5-325 MG PO TABS
1.0000 | ORAL_TABLET | Freq: Four times a day (QID) | ORAL | Status: DC | PRN
  Administered 2017-11-03 (×2): 1 via ORAL
  Filled 2017-11-03 (×2): qty 1

## 2017-11-03 MED ORDER — ONDANSETRON HCL 4 MG PO TABS: 4.0000 mg | ORAL_TABLET | Freq: Four times a day (QID) | ORAL | Status: DC | PRN

## 2017-11-03 NOTE — ED Notes (Signed)
Admitting MD at bedside.

## 2017-11-03 NOTE — Progress Notes (Signed)
Initial Nutrition Assessment  DOCUMENTATION CODES:  Not applicable  INTERVENTION:  Will order 30 mL Prostat BID, each supplement provides 100 kcal and 15 grams of protein.  Juven modular to provide Arginine, glutamine, HMB to support wound healing.   NUTRITION DIAGNOSIS:  Increased nutrient needs related to wound healing as evidenced by estimated nutritional requirements for this condition  GOAL:  Patient will meet greater than or equal to 90% of their needs  MONITOR:  PO intake, Supplement acceptance, Labs, Weight trends, I & O's, Skin  REASON FOR ASSESSMENT:  Consult Wound healing  ASSESSMENT:  81 y/o female PMHx DM2, Aortic Stenosis, Nonischemic cardiomyopathy, chronic venous stasis ulcers, HLD, HTN, CAD. Presented for evaluation of R leg wound after being seen at wound clinic and found to have infection. Wound has been presented for many weeks. Admitted to hospital for management and RD consulted for wound healing.   Patient reports that even though she has had wound and related infection for a few weeks, she had not hay many related side effects. She denies any loss of appetite, n/v/c/d. She has been eating her normal amount. She says she tries to follow a DM diet at home. She reports her A1C has never been as high as it is now and is typically 6-7. She takes a calcium and Vit D supplement Regularly. She says her UBW is 157-158 lbs and this has been stable.   Per objective chart information, her weight has been stable at 156-160 lbs for the past 2 years. Her clothes fit the same. She does not feel like she has lost any weight.   Patient was agreeable to wound healing supplements. She says her appetite is good and that she had eaten well for breakfast. RD showed menu.   Labs: A1C: 8.1, CRP: 1.8, Prealbumin: 1.8, Albumin: 2.4, Mag:1.5, BGs in 130s Meds: Lasix, iron, insulin, IV abx, prn opiate medication  Recent Labs  Lab 11/02/17 2011 11/03/17 0825  NA 131* 133*  K 3.5 3.8   CL 99* 100*  CO2 23 28  BUN 7 6  CREATININE 0.71 0.63  CALCIUM 8.1* 8.0*  MG  --  1.5*  PHOS  --  3.0  GLUCOSE 165* 136*   NUTRITION - FOCUSED PHYSICAL EXAM: Deferred  Diet Order:  Diet heart healthy/carb modified Room service appropriate? Yes; Fluid consistency: Thin  EDUCATION NEEDS:  Not appropriate for education at this time  Skin:  Weeping, Cellulitis, Venous Stasis Ulcer to LLE  Last BM:  Unknown  Height:  Ht Readings from Last 1 Encounters:  11/02/17 5\' 5"  (1.651 m)   Weight:  Wt Readings from Last 1 Encounters:  11/02/17 156 lb (70.8 kg)   Wt Readings from Last 10 Encounters:  11/02/17 156 lb (70.8 kg)  10/30/17 156 lb (70.8 kg)  10/25/17 158 lb (71.7 kg)  05/29/17 160 lb (72.6 kg)  05/17/17 156 lb (70.8 kg)  09/25/16 161 lb (73 kg)  04/03/16 160 lb 9.6 oz (72.8 kg)  02/15/16 156 lb 6.4 oz (70.9 kg)  11/15/15 160 lb (72.6 kg)  10/06/15 161 lb (73 kg)   Ideal Body Weight:  56.82 kg  BMI:  Body mass index is 25.96 kg/m.  Estimated Nutritional Needs:  Kcal:  1750-2000 kcals (25-28 kcal/kg bw) Protein:  78-92g Pro (1.1-1.3 g/kg bw) Fluid:  >1.8 L fluid (25 ml/kg bw)  Burtis Junes RD, LDN, CNSC Clinical Nutrition Pager: [phone] 11/03/2017 12:12 PM

## 2017-11-03 NOTE — Progress Notes (Addendum)
Pharmacy Antibiotic Note  Emily Wall is a 81 y.o. female admitted on 11/02/2017 with right leg wounds.  Pharmacy has been consulted for Vancomycin/Cefepime dosing. WBC WNL. Renal function good.   Plan: Vancomycin 1250 mg IV q24h Cefepime 1g IV q8h Trend WBC, temp, renal function  F/U infectious work-up Drug levels as indicated   Height: 5\' 5"  (165.1 cm) Weight: 156 lb (70.8 kg) IBW/kg (Calculated) : 57  Temp (24hrs), Avg:98 F (36.7 C), Min:98 F (36.7 C), Max:98 F (36.7 C)  Recent Labs  Lab 11/02/17 2011  WBC 5.0  CREATININE 0.71    Estimated Creatinine Clearance: 57.2 mL/min (by C-G formula based on SCr of 0.71 mg/dL).    No Known Allergies  Narda Bonds 11/03/2017 1:09 AM

## 2017-11-03 NOTE — Progress Notes (Signed)
The patient was admitted early this AM after midnight and H and P has been reviewed and I am in current agreement with the Assessment and Plan done by Dr. Tamala Julian. Additional changes to the plan of care have been made accordingly. The patient is a 81 yo Female with a PMH of Diabetes Mellitus Type 2, Non-ischemic Cardiomyopathy, Aortic Stenosis, Chronic Venous Stasis, HTN, HLD, Hypothyroidism, Osteoporosis and other comorbids who presented to ED at recommendation from a [facility] for a Right Leg Wound Infection. She was placed on Antibiotics for a Leg Wound Infection with Cellulitis and placed on IV Vancomycin, IV Cefepime and IV Metronidazole. Case was discussed with Orthopedic Surgery Dr. Erlinda Hong who reviewed admission picture and recommended evaluation by Dr. Sharol Given and will happen, Monday 11/12. In the interm will C/w IV Abx and await WOC nurse consultation. Patient's Magnesium was low at 1.5 and will replete. Will continue to monitor patient's clinical response to intervention and repeat blood work in the AM.

## 2017-11-03 NOTE — ED Provider Notes (Signed)
[facility] EMERGENCY DEPARTMENT Provider Note   CSN: [id number] Arrival date & time: 11/02/17  1957     History   Chief Complaint Chief Complaint  Patient presents with  . Leg Pain  . Wound Infection    HPI Emily Wall is a 81 y.o. female.  Patient is a 81 year old female with past medical history of diabetes, aortic stenosis, nonischemic cardiomyopathy, and chronic venous stasis ulcers.  She presents today for evaluation of a right leg wound.  This is apparently been present for many weeks.  She was seen at wound care clinic yesterday, then told to come to the ER to see a surgeon and have IV antibiotics.  She denies to me she is experiencing any fevers, nausea, vomiting.   The history is provided by the patient.  Leg Pain   This is a new problem. Episode onset: Several weeks ago. The problem occurs constantly. The problem has been gradually worsening. Pain location: Right lower leg. The pain is mild. Pertinent negatives include no numbness. She has tried nothing for the symptoms. The treatment provided no relief.    Past Medical History:  Diagnosis Date  . Aortic regurgitation    Mild  . Aortic stenosis    Moderate  . Coronary atherosclerosis of native coronary artery    Nonobstructive 2007  . Essential hypertension, benign   . Hyperlipidemia   . Hypothyroidism   . Nonischemic cardiomyopathy (HCC)    LVEF improved to 50-55%  . Osteoporosis   . Type 2 diabetes mellitus [facility])     Patient Active Problem List   Diagnosis Date Noted  . BMI 26.0-26.9,adult 11/15/2015  . Family history of malignant neoplasm of gastrointestinal tract 06/30/2013  . Hyperlipidemia 06/02/2013  . Diabetes mellitus ([place]) 03/29/2011  . Osteoporosis 03/29/2011  . Iron deficiency anemia 12/29/2010  . Essential hypertension 09/08/2009  . Aortic valve disorder 09/08/2009  . Secondary cardiomyopathy ([place]) 09/08/2009    Past Surgical History:  Procedure Laterality Date    . SKIN GRAFT      OB History    No data available       Home Medications    Prior to Admission medications   Medication Sig Start Date End Date Taking? Authorizing Provider  alendronate (FOSAMAX) 70 MG tablet TAKE 1 TABLET BY MOUTH ONCE A WEEK WITH A FULL GLASS OF WATER ON AN EMPTY STOMACH 02/19/17   Hassell Done, Mary-Margaret, FNP  aspirin 325 MG tablet Take 325 mg by mouth daily.      [provider]  atorvastatin (LIPITOR) 20 MG tablet Take 1 tablet (20 mg total) by mouth daily. 05/29/17   Hassell Done, Mary-Margaret, FNP  Calcium Carbonate-Vitamin D (CALCIUM + D PO) Take 1 tablet by mouth daily.      [provider]  carvedilol (COREG) 25 MG tablet TAKE 1 TABLET (25 MG TOTAL) BY MOUTH 2 (TWO) TIMES DAILY WITH A MEAL. 06/04/17   Hassell Done Mary-Margaret, FNP  cholecalciferol (VITAMIN D) 1000 UNITS tablet Take 1,000 Units by mouth daily.     [provider]  ciprofloxacin (CIPRO) 500 MG tablet Take 1 tablet (500 mg total) 2 (two) times daily by mouth. 11/01/17   Hassell Done Mary-Margaret, FNP  ferrous sulfate 325 (65 FE) MG tablet Take 1 tablet (325 mg total) by mouth daily with breakfast. 05/29/17   Hassell Done, Mary-Margaret, FNP  furosemide (LASIX) 20 MG tablet Take 1 tablet (20 mg total) by mouth daily as needed. 09/21/17   Hassell Done Mary-Margaret, FNP  glucose blood  test strip  05/19/13   [provider]  HYDROcodone-acetaminophen (LORTAB) 5-325 MG tablet Take 1 tablet every 6 (six) hours as needed by mouth for moderate pain. 10/30/17   Chevis Pretty, FNP  LANCETS ULTRA THIN [place]  05/19/13   [provider]  lisinopril (PRINIVIL,ZESTRIL) 40 MG tablet TAKE 1 TABLET BY MOUTH EVERY DAY 09/17/17   Hassell Done, Mary-Margaret, FNP  metFORMIN (GLUCOPHAGE) 1000 MG tablet TAKE 1 TABLET (1,000 MG TOTAL) BY MOUTH 2 (TWO) TIMES DAILY WITH A MEAL. 09/26/17   Janora Norlander, DO  triamcinolone cream (KENALOG) 0.1 % APPLY TO AFFECTED AREA TWICE A DAY 07/30/17   Chevis Pretty,  FNP    Family History Family History  Problem Relation Age of Onset  . Colon cancer Mother   . Colon cancer Brother   . Diabetes Brother   . Prostate cancer Brother     Social History Social History   Tobacco Use  . Smoking status: Former Smoker    Packs/day: 0.50    Years: 30.00    Pack years: 15.00    Types: Cigarettes    Last attempt to quit: 12/25/1988    Years since quitting: 28.8  . Smokeless tobacco: Never Used  Substance Use Topics  . Alcohol use: No    Alcohol/week: 0.0 oz  . Drug use: No     Allergies   Patient has no known allergies.   Review of Systems Review of Systems  Neurological: Negative for numbness.  All other systems reviewed and are negative.    Physical Exam Updated Vital Signs BP 133/63   Pulse 70   Temp 98 F (36.7 C) (Oral)   Resp 16   Ht 5\' 5"  (1.651 m)   Wt 70.8 kg (156 lb)   SpO2 98%   BMI 25.96 kg/m   Physical Exam  Constitutional: She is oriented to person, place, and time. She appears well-developed and well-nourished. No distress.  HENT:  Head: Normocephalic and atraumatic.  Neck: Normal range of motion. Neck supple.  Cardiovascular: Normal rate and regular rhythm. Exam reveals no gallop and no friction rub.  Murmur heard. There is a 3+ systolic ejection murmur heard best at the right upper sternal border.  Pulmonary/Chest: Effort normal and breath sounds normal. No respiratory distress. She has no wheezes.  Abdominal: Soft. Bowel sounds are normal. She exhibits no distension. There is no tenderness.  Musculoskeletal: Normal range of motion.  There is a large, full-thickness, ulcer to the posterior aspect of the right lower leg overlying the Achilles tendon.  There is foul-smelling drainage along with surrounding erythema.  DP pulse is unable to be palpated in either foot.  Neurological: She is alert and oriented to person, place, and time.  Skin: Skin is warm and dry. She is not diaphoretic.  Nursing note and vitals  reviewed.    ED Treatments / Results  Labs (all labs ordered are listed, but only abnormal results are displayed) Labs Reviewed  COMPREHENSIVE METABOLIC PANEL - Abnormal; Notable for the following components:      Result Value   Sodium 131 (*)    Chloride 99 (*)    Glucose, Bld 165 (*)    Calcium 8.1 (*)    Total Protein 6.1 (*)    Albumin 2.6 (*)    ALT 10 (*)    All other components within normal limits  CBC WITH DIFFERENTIAL/PLATELET - Abnormal; Notable for the following components:   RBC 3.49 (*)    Hemoglobin 10.4 (*)  HCT 31.9 (*)    All other components within normal limits    EKG  EKG Interpretation None       Radiology No results found.  Procedures Procedures (including critical care time)  Medications Ordered in ED Medications  vancomycin (VANCOCIN) IVPB 1000 mg/200 mL premix (not administered)     Initial Impression / Assessment and Plan / ED Course  I have reviewed the triage vital signs and the nursing notes.  Pertinent labs & imaging results that were available during my care of the patient were reviewed by me and considered in my medical decision making (see chart for details).  Patient with significant diabetic ulcers of the right lower extremity.  I feels that she will require intravenous antibiotics and likely surgical consultation.  She has been given vancomycin in the ER.  I have spoken with Dr. Tamala Julian from the hospitalist service who agrees to admit.  Final Clinical Impressions(s) / ED Diagnoses   Final diagnoses:  None    ED Discharge Orders    None       Veryl Speak, MD 11/03/17 0020

## 2017-11-03 NOTE — H&P (Addendum)
History and Physical    Emily Wall GNF:[id number] DOB: [birth year] DOA: 11/02/2017  Referring MD/NP/PA: Dr. Veryl Speak PCP: Chevis Pretty, [place]  Patient coming from: home  Chief Complaint: Right leg wound infection  HPI: Emily Wall is a 81 y.o. female with medical history significant of DM type 2, NICM, aortic stenosis, chronic venous stasis ulcers; who presents at the advisement of the wound care center for a right leg wound infection.  Patient notes that she has ulcerations present on both lower extremities, but the right leg specifically had become red and swollen with drainage over the last 3 weeks.  She complains of associated pain, but has been taking hydrocodone with adequate relief.  She had been started on ciprofloxacin approximately 1 week ago and reports that the lower extremity redness and swelling had improved.  She was seen in wound care clinic in [place] and because of the severity of the wounds advised her to come in for further evaluation.  Patient notes that she previously had skin grafts of the right lower extremity due to these venous stasis ulcers.  ED Course: Upon admission into the emergency department patient was noted to have vital signs relatively within normal limits.  Labs revealed WBC 5, hemoglobin 10.4, sodium 131, and glucose 165.  Patient was initially started on vancomycin for the lower extremity wounds. [facility] called to admit.  Review of Systems  Constitutional: Negative for chills, fever, malaise/fatigue and weight loss.  HENT: Negative for ear discharge and nosebleeds.   Eyes: Negative for photophobia and pain.  Respiratory: Negative for shortness of breath.   Cardiovascular: Positive for leg swelling. Negative for chest pain.  Gastrointestinal: Negative for abdominal pain, nausea and vomiting.  Genitourinary: Negative for dysuria and frequency.  Musculoskeletal: Positive for myalgias. Negative for falls.  Skin:       Positive for lower  extremity wounds  Neurological: Negative for speech change and focal weakness.  Psychiatric/Behavioral: Negative for substance abuse. The patient is not nervous/anxious.     Past Medical History:  Diagnosis Date  . Aortic regurgitation    Mild  . Aortic stenosis    Moderate  . Coronary atherosclerosis of native coronary artery    Nonobstructive 2007  . Essential hypertension, benign   . Hyperlipidemia   . Hypothyroidism   . Nonischemic cardiomyopathy (HCC)    LVEF improved to 50-55%  . Osteoporosis   . Type 2 diabetes mellitus ([place])     Past Surgical History:  Procedure Laterality Date  . SKIN GRAFT       reports that she quit smoking about 28 years ago. Her smoking use included cigarettes. She has a 15.00 pack-year smoking history. she has never used smokeless tobacco. She reports that she does not drink alcohol or use drugs.  No Known Allergies  Family History  Problem Relation Age of Onset  . Colon cancer Mother   . Colon cancer Brother   . Diabetes Brother   . Prostate cancer Brother     Prior to Admission medications   Medication Sig Start Date End Date Taking? Authorizing Provider  alendronate (FOSAMAX) 70 MG tablet TAKE 1 TABLET BY MOUTH ONCE A WEEK WITH A FULL GLASS OF WATER ON AN EMPTY STOMACH 02/19/17   Hassell Done, Mary-Margaret, FNP  aspirin 325 MG tablet Take 325 mg by mouth daily.      [provider]  atorvastatin (LIPITOR) 20 MG tablet Take 1 tablet (20 mg total) by mouth daily. 05/29/17   Chevis Pretty, [place]  Calcium Carbonate-Vitamin D (CALCIUM + D PO) Take 1 tablet by mouth daily.      [provider]  carvedilol (COREG) 25 MG tablet TAKE 1 TABLET (25 MG TOTAL) BY MOUTH 2 (TWO) TIMES DAILY WITH A MEAL. 06/04/17   Hassell Done Mary-Margaret, FNP  cholecalciferol (VITAMIN D) 1000 UNITS tablet Take 1,000 Units by mouth daily.     [provider]  ciprofloxacin (CIPRO) 500 MG tablet Take 1 tablet (500 mg total) 2 (two) times daily by  mouth. 11/01/17   Hassell Done Mary-Margaret, FNP  ferrous sulfate 325 (65 FE) MG tablet Take 1 tablet (325 mg total) by mouth daily with breakfast. 05/29/17   Hassell Done, Mary-Margaret, FNP  furosemide (LASIX) 20 MG tablet Take 1 tablet (20 mg total) by mouth daily as needed. 09/21/17   Chevis Pretty, FNP  glucose blood test strip  05/19/13   [provider]  HYDROcodone-acetaminophen (LORTAB) 5-325 MG tablet Take 1 tablet every 6 (six) hours as needed by mouth for moderate pain. 10/30/17   Chevis Pretty, FNP  LANCETS ULTRA THIN [place]  05/19/13   [provider]  lisinopril (PRINIVIL,ZESTRIL) 40 MG tablet TAKE 1 TABLET BY MOUTH EVERY DAY 09/17/17   Hassell Done, Mary-Margaret, FNP  metFORMIN (GLUCOPHAGE) 1000 MG tablet TAKE 1 TABLET (1,000 MG TOTAL) BY MOUTH 2 (TWO) TIMES DAILY WITH A MEAL. 09/26/17   Janora Norlander, DO  triamcinolone cream (KENALOG) 0.1 % APPLY TO AFFECTED AREA TWICE A DAY 07/30/17   Chevis Pretty, FNP    Physical Exam:  Constitutional: Elderly female in NAD, calm, comfortable Vitals:   11/02/17 2007 11/02/17 2330 11/02/17 2345 11/03/17 0000  BP:  134/74 131/71 133/63  Pulse:  80 83 70  Resp:    16  Temp:      TempSrc:      SpO2:  100% 99% 98%  Weight: 70.8 kg (156 lb)     Height: 5' 5"  (1.651 m)      Eyes: PERRL, lids and conjunctivae normal ENMT: Mucous membranes are moist. Posterior pharynx clear of any exudate or lesions.Normal dentition.  Neck: normal, supple, no masses, no thyromegaly Respiratory: clear to auscultation bilaterally, no wheezing, no crackles. Normal respiratory effort. No accessory muscle use.  Cardiovascular: Regular rate and rhythm with positive systolic ejection murmur 3 out of 6 . +1 pitting edema. No carotid bruits.  Abdomen: no tenderness, no masses palpated. No hepatosplenomegaly. Bowel sounds positive.  Musculoskeletal: no clubbing / cyanosis. No joint deformity upper and lower extremities. Good ROM, no contractures.  Normal muscle tone.  Skin: Patient has bilateral lower extremity ulcerations with purulent drainage and surrounding erythema, but the right lower extremity ulceration appears to have exposed Achilles tendon.   Neurologic: CN 2-12 grossly intact. Sensation abnormal, DTR normal. Strength 5/5 in all 4.  Psychiatric: Normal judgment and insight. Alert and oriented x 3. Normal mood.     Labs on Admission: I have personally reviewed following labs and imaging studies  CBC: Recent Labs  Lab 11/02/17 2011  WBC 5.0  NEUTROABS 3.0  HGB 10.4*  HCT 31.9*  MCV 91.4  PLT 496   Basic Metabolic Panel: Recent Labs  Lab 11/02/17 2011  NA 131*  K 3.5  CL 99*  CO2 23  GLUCOSE 165*  BUN 7  CREATININE 0.71  CALCIUM 8.1*   GFR: Estimated Creatinine Clearance: 57.2 mL/min (by C-G formula based on SCr of 0.71 mg/dL). Liver Function Tests: Recent Labs  Lab 11/02/17 2011  AST 15  ALT 10*  ALKPHOS 54  BILITOT 0.6  PROT 6.1*  ALBUMIN 2.6*   No results for input(s): LIPASE, AMYLASE in the last 168 hours. No results for input(s): AMMONIA in the last 168 hours. Coagulation Profile: No results for input(s): INR, PROTIME in the last 168 hours. Cardiac Enzymes: No results for input(s): CKTOTAL, CKMB, CKMBINDEX, TROPONINI in the last 168 hours. BNP (last 3 results) No results for input(s): PROBNP in the last 8760 hours. HbA1C: No results for input(s): HGBA1C in the last 72 hours. CBG: No results for input(s): GLUCAP in the last 168 hours. Lipid Profile: No results for input(s): CHOL, HDL, LDLCALC, TRIG, CHOLHDL, LDLDIRECT in the last 72 hours. Thyroid Function Tests: No results for input(s): TSH, T4TOTAL, FREET4, T3FREE, THYROIDAB in the last 72 hours. Anemia Panel: No results for input(s): VITAMINB12, FOLATE, FERRITIN, TIBC, IRON, RETICCTPCT in the last 72 hours. Urine analysis: No results found for: COLORURINE, APPEARANCEUR, LABSPEC, PHURINE, GLUCOSEU, HGBUR, BILIRUBINUR, KETONESUR,  PROTEINUR, UROBILINOGEN, NITRITE, LEUKOCYTESUR Sepsis Labs: No results found for this or any previous visit (from the past 240 hour(s)).   Radiological Exams on Admission: No results found.    Assessment/Plan Leg wound infection with cellulitis 2/2 venous stasis ulcers in diabetic: Acute.  Patient presents with worsening lower extremity wounds.  Recently treated for cellulitis with some improvement on ciprofloxacin.  Advised to come in for further evaluation due to location of wounds. - Admit to have his medsurg bed - Lower extremity order set initiated - Follow-up blood cultures - Add on ESR and CRP - Continue empiric antibiotics of vancomycin, broaden to include cefepime, and metronidazole IV - Wound care consult -  May warrant orthopedic consultation in a.m. given exposed Achilles tendon  Anemia of chronic disease: Patient's hemoglobin appears to be 10.4 which is similar to previous.  No reported complaints of bleeding. - Recheck CBC in a.m. - Continue iron supplementation  Essential hypertension - Continue Coreg, lisinopril, and Lasix   Diabetes mellitus type 2: Patient is supposed to be on metformin and Januvia, but notes that the Januvia prescription is too expensive for her to afford and has not started this. - check hemoglobin A1c in a.m. - Hypoglycemic protocols - Hold metformin and Januvia - CBGs q. before meals and at bedtime with sensitive SSI  Combined systolic and diastolic CHF aortic stenosis :No signs of the patient being fluid overloaded. Last EF noted to be reported 45% with grade 2 diastolic dysfunction moderate aortic stenosis in 05/2017. - Monitor ins and outs  Hyperlipidemia - Continue atorvastatin  Hypoalbuminemia - Follow-up prealbumin  DVT prophylaxis:  lovenox   Code Status: Full  Family Communication: Discussed plan of care with the patient family present at bedside Disposition Plan: TBD Consults called: none Admission status:  Inpatient  Norval Morton MD [facility] Pager [phone]   If 7PM-7AM, please contact night-coverage [email or web address] Password [email or web address]  11/03/2017, 12:15 AM

## 2017-11-03 NOTE — Progress Notes (Signed)
VASCULAR LAB PRELIMINARY  ARTERIAL  ABI completed:Right ABI not ascertained secondary to pain with compression. However, waveforms and great toe pressure indicates inadequate flow.  Left ABI not ascertained secondary to non compressible vessels.  Posterior tibial signal most likely venous flow.  Great toe pressure is abnormal.       RIGHT    LEFT    PRESSURE WAVEFORM  PRESSURE WAVEFORM  BRACHIAL 111 T BRACHIAL 109 T  DP   DP 300   AT  M AT    PT  M PT 34 DM  PER   PER    GREAT TOE 21 NA GREAT TOE 57 NA    RIGHT LEFT  ABI  Non compressible     Trey Gulbranson, RVT 11/03/2017, 4:18 PM

## 2017-11-04 LAB — CBC WITH DIFFERENTIAL/PLATELET
BASOS ABS: 0 10*3/uL (ref 0.0–0.1)
BASOS PCT: 0 %
EOS PCT: 6 %
Eosinophils Absolute: 0.2 10*3/uL (ref 0.0–0.7)
HEMATOCRIT: 29.9 % — AB (ref 36.0–46.0)
Hemoglobin: 9.7 g/dL — ABNORMAL LOW (ref 12.0–15.0)
Lymphocytes Relative: 22 %
Lymphs Abs: 0.9 10*3/uL (ref 0.7–4.0)
MCH: 29.7 pg (ref 26.0–34.0)
MCHC: 32.4 g/dL (ref 30.0–36.0)
MCV: 91.4 fL (ref 78.0–100.0)
MONO ABS: 0.4 10*3/uL (ref 0.1–1.0)
Monocytes Relative: 10 %
NEUTROS ABS: 2.4 10*3/uL (ref 1.7–7.7)
Neutrophils Relative %: 62 %
PLATELETS: 202 10*3/uL (ref 150–400)
RBC: 3.27 MIL/uL — AB (ref 3.87–5.11)
RDW: 14.7 % (ref 11.5–15.5)
WBC: 3.9 10*3/uL — AB (ref 4.0–10.5)

## 2017-11-04 LAB — COMPREHENSIVE METABOLIC PANEL
ALBUMIN: 2.2 g/dL — AB (ref 3.5–5.0)
ALT: 9 U/L — AB (ref 14–54)
AST: 11 U/L — ABNORMAL LOW (ref 15–41)
Alkaline Phosphatase: 43 U/L (ref 38–126)
Anion gap: 5 (ref 5–15)
CHLORIDE: 103 mmol/L (ref 101–111)
CO2: 28 mmol/L (ref 22–32)
CREATININE: 0.62 mg/dL (ref 0.44–1.00)
Calcium: 7.7 mg/dL — ABNORMAL LOW (ref 8.9–10.3)
GFR calc Af Amer: >60 mL/min (ref >60)
GFR calc non Af Amer: >60 mL/min (ref >60)
GLUCOSE: 151 mg/dL — AB (ref 65–99)
POTASSIUM: 3.5 mmol/L (ref 3.5–5.1)
Sodium: 136 mmol/L (ref 135–145)
Total Bilirubin: 0.5 mg/dL (ref 0.3–1.2)
Total Protein: 5.6 g/dL — ABNORMAL LOW (ref 6.5–8.1)

## 2017-11-04 LAB — MAGNESIUM: Magnesium: 1.8 mg/dL (ref 1.7–2.4)

## 2017-11-04 LAB — PHOSPHORUS: Phosphorus: 3.4 mg/dL (ref 2.5–4.6)

## 2017-11-04 LAB — GLUCOSE, CAPILLARY
GLUCOSE-CAPILLARY: 146 mg/dL — AB (ref 65–99)
Glucose-Capillary: 113 mg/dL — ABNORMAL HIGH (ref 65–99)
Glucose-Capillary: 110 mg/dL — ABNORMAL HIGH (ref 65–99)
Glucose-Capillary: 157 mg/dL — ABNORMAL HIGH (ref 65–99)

## 2017-11-04 NOTE — Progress Notes (Signed)
PROGRESS NOTE    Emily Wall  QZE:[id number] DOB: [birth year] DOA: 11/02/2017 PCP: Chevis Pretty, FNP  Brief Narrative:  The patient is a 80 yo Female with a PMH of Diabetes Mellitus Type 2, Non-ischemic Cardiomyopathy, Aortic Stenosis, Chronic Venous Stasis, HTN, HLD, Hypothyroidism, Osteoporosis and other comorbids who presented to ED at recommendation from a [place] for a Right Leg Wound Infection. She was placed on Antibiotics for a Leg Wound Infection with Cellulitis and placed on IV Vancomycin, IV Cefepime and IV Metronidazole. Case was discussed with Orthopedic Surgery Dr. Erlinda Hong who reviewed admission picture and recommended evaluation by Dr. Sharol Given and will happen, Monday 11/12. In the interm will C/w IV Abx and await WOC nurse consultation. WOC will order topical care until Dr. Sharol Given evaluates the patient as she has exposed tendon to maintain tendon viability. Case was discussed with Dr. Oneida Alar and will hold off on formally consulting Vascular Surgery until Orthopedic Evaluation.   Assessment & Plan:   Principal Problem:   Wound infection Active Problems:   Essential hypertension   Aortic valve disorder   Diabetes mellitus (HCC)   Hypoalbuminemia   Venous stasis ulcer (HCC)  Leg Wound Infection with cellulitis 2/2 Venous Stasis ulcers in diabetic  -Acute. Patient presents with worsening lower extremity wounds.   -Recently treated for cellulitis with some improvement on ciprofloxacin.  Advised to come in for further evaluation due to location of wounds. - Admitted to Med Surg - Lower extremity order set initiated - Follow-up Blood Cultures; Blood Cx x 2 show NGTD at 1 day - ESR was 26 and CRP was 1.8  - Continue Empiric antibiotics of Vancomycin, Cefepime, and metronidazole IV - Wound Care Consult recommends topical care with moisture to maintain tendon viability  - ABI's ordered but Right ABI not done 2/2 to Pain with Compression however wave forms and great toe  pressure indicates inadequate flow. Left ABI not done 2/2 to Non compressible vessels. Posterior Tibial Signal Most likely venous flow. Great Toe pressure is abnormal. - Discussed with Vascular Surgery Dr. Oneida Alar and will hold of formal consultation until after evaluated by Orthopedic Surgery Dr. Sharol Given evaluates; Per my conversation with Dr. Oneida Alar, Dr. Keturah Barre - Consulted Orthopedic Dr. Erlinda Hong for evaluation however he recommended that she be evaluated by Dr. Sharol Given which will happen on Monday 11/05/17.  - C/w Abx in the Interm   Anemia of Chronic Disease - Patient's Hb was 10.4 on Admission.   - No reported complaints of bleeding. - Hb/Hct went from 10.4/31.9 -> 10.2/31.6 -> 9.7/29.9 - Continue Iron Supplementation with Ferrous Sulfate 325 mg po Daily - Repeat CBC in AM   Essential Hypertension - Controlled  - C/w Carvedilol 25 mg po BID, Furosemide 20 mg po Daily, and with Lisinopril 40 mg po Daily    Diabetes Mellitus Type 2 - Patient is supposed to be on Metformin and Januvia, but notes that the Januvia prescription is too expensive for her to afford and has not started this. - Checked Hemoglobin A1c and was 8.1 - Hypoglycemic protocols - Hold Metformin and Januvia - Started patient on Sensitive Novolog SSI  - CBG's ranging from 113-146   Combined Systolic and Diastolic CHF / Aortic Stenosis - No signs of the patient being fluid overloaded.  - Last EF noted to be reported 45% with Grade 2 Diastolic Dysfunction and Moderate aortic Stenosis in 05/2017. - Strict I's and O's; Daily Weights ordered - Patient is +1.370 Liters; - Continue to Monitor Volume Status  Hyperlipidemia - Check Lipid Panel in AM - Continue Atorvastatin 20 mg po Daily  Hypoalbuminemia - Prealbumin was 6.1; Albumin was 2.2 this AM - Nutritionist consulted for Evaluation and Management  Hypomagnesemia -Patient's Mag Level was 1.5 and improved to 1.8 -Replete with IV Mag Sulfate 2 grams yesterday -Continue to  Monitor and Replete as Necessary -Repeat Mag Level in AM  DVT prophylaxis: Enoxaparin 40 mg sq q24h Code Status: FULL CODE Family Communication: No family present at bedside  Disposition Plan: Remain Inpatient for evaluation   Consultants:   Orthopedic Surgery  Dietary   Discussed Case with Vascular Surgery Dr. Oneida Alar    Procedures:  ABI ABI completed:Right ABI not ascertained secondary to pain with compression. However, waveforms and great toe pressure indicates inadequate flow.  Left ABI not ascertained secondary to non compressible vessels.  Posterior tibial signal most likely venous flow.  Great toe pressure is abnormal.       RIGHT    LEFT    PRESSURE WAVEFORM  PRESSURE WAVEFORM  BRACHIAL 111 T BRACHIAL 109 T  DP   DP 300   AT  M AT    PT  M PT 34 DM  PER   PER    GREAT TOE 21 NA GREAT TOE 57 NA    RIGHT LEFT  ABI  Non compressible    Antimicrobials: Anti-infectives (From admission, onward)   Start     Dose/Rate Route Frequency Ordered Stop   11/03/17 2200  vancomycin (VANCOCIN) 1,250 mg in sodium chloride 0.9 % 250 mL IVPB     1,250 mg 166.7 mL/hr over 90 Minutes Intravenous Every 24 hours 11/03/17 0110     11/03/17 1400  ceFEPIme (MAXIPIME) 1 g in dextrose 5 % 50 mL IVPB     1 g 100 mL/hr over 30 Minutes Intravenous Every 8 hours 11/03/17 0145     11/03/17 0200  metroNIDAZOLE (FLAGYL) IVPB 500 mg     500 mg 100 mL/hr over 60 Minutes Intravenous Every 8 hours 11/03/17 0137     11/03/17 0200  ceFEPIme (MAXIPIME) 2 g in dextrose 5 % 50 mL IVPB     2 g 100 mL/hr over 30 Minutes Intravenous  Once 11/03/17 0146 11/03/17 0503   11/03/17 0000  vancomycin (VANCOCIN) IVPB 1000 mg/200 mL premix     1,000 mg 200 mL/hr over 60 Minutes Intravenous  Once 11/02/17 2355 11/03/17 0216     Subjective: Seen and examined at bedside and she was sitting a chair. No problems or complaints at this time and states leg is hurting some.    Objective: Vitals:   11/03/17 0257 11/03/17 1340 11/03/17 2240 11/04/17 0609  BP: (!) 147/64 (!) 108/52 (!) 121/46 126/64  Pulse: 83 79 80 84  Resp:  _0 Temp: 97.9 F (36.6 C) 98.2 F (36.8 C) 98.4 F (36.9 C) 98.2 F (36.8 C)  TempSrc: Oral Oral Oral Oral  SpO2: 96% 93% 100% 100%  Weight:      Height:        Intake/Output Summary (Last 24 hours) at 11/04/2017 0820 Last data filed at 11/04/2017 0602 Gross per 24 hour  Intake 820 ml  Output -  Net 820 ml   Filed Weights   11/02/17 2007  Weight: 70.8 kg (156 lb)   Examination: Physical Exam:  Constitutional: WN/WD AAF in NAD and appears calm and comfortable Eyes: Lids and conjunctivae normal, sclerae anicteric  ENMT: External Ears, Nose appear normal. Grossly normal hearing.  Mucous membranes are moist.  Neck: Appears normal, supple, no cervical masses, normal ROM, no appreciable thyromegaly, no JVD Respiratory: Clear to auscultation bilaterally, no wheezing, rales, rhonchi or crackles. Normal respiratory effort and patient is not tachypenic. No accessory muscle use.  Cardiovascular: RRR; Has a 3/6 systolic murmur. S1 and S2 auscultated. Mild extremity edema. Abdomen: Soft, non-tender, non-distended. No masses palpated. No appreciable hepatosplenomegaly. Bowel sounds positive x4.  GU: Deferred. Musculoskeletal: No clubbing / cyanosis of digits/nails. No joint deformity upper and lower extremities. Good ROM, no contractures. Skin: Has bilateral LE ulcerations with Right worse than left with purulent and foul smelling drainage. There is some erythema and Right posterior ulceration has exposed Achilles tendo. No induration; Warm and dry.  Neurologic: CN 2-12 grossly intact with no appreciable focal deficits. Romberg sign and cerebellar reflexes not assessed.  Psychiatric: Normal judgment and insight. Alert and oriented x 3. Normal mood and appropriate affect.   Data Reviewed: I have personally reviewed following labs  and imaging studies  CBC: Recent Labs  Lab 11/02/17 2011 11/03/17 0825 11/04/17 0317  WBC 5.0 4.8 3.9*  NEUTROABS 3.0 3.1 2.4  HGB 10.4* 10.2* 9.7*  HCT 31.9* 31.6* 29.9*  MCV 91.4 91.9 91.4  PLT 202 208 681   Basic Metabolic Panel: Recent Labs  Lab 11/02/17 2011 11/03/17 0825 11/04/17 0317  NA 131* 133* 136  K 3.5 3.8 3.5  CL 99* 100* 103  CO2 _0 GLUCOSE 165* 136* 151*  BUN 7 6 <5*  CREATININE 0.71 0.63 0.62  CALCIUM 8.1* 8.0* 7.7*  MG  --  1.5* 1.8  PHOS  --  3.0 3.4   GFR: Estimated Creatinine Clearance: 57.2 mL/min (by C-G formula based on SCr of 0.62 mg/dL). Liver Function Tests: Recent Labs  Lab 11/02/17 2011 11/03/17 0825 11/04/17 0317  AST 15 14* 11*  ALT 10* 10* 9*  ALKPHOS 54 48 43  BILITOT 0.6 0.5 0.5  PROT 6.1* 5.6* 5.6*  ALBUMIN 2.6* 2.4* 2.2*   No results for input(s): LIPASE, AMYLASE in the last 168 hours. No results for input(s): AMMONIA in the last 168 hours. Coagulation Profile: No results for input(s): INR, PROTIME in the last 168 hours. Cardiac Enzymes: No results for input(s): CKTOTAL, CKMB, CKMBINDEX, TROPONINI in the last 168 hours. BNP (last 3 results) No results for input(s): PROBNP in the last 8760 hours. HbA1C: Recent Labs    11/03/17 0602  HGBA1C 8.1*   CBG: Recent Labs  Lab 11/03/17 0548 11/03/17 1138 11/03/17 1647 11/03/17 2243 11/04/17 0709  GLUCAP 133* 136* 143* 143* 113*   Lipid Profile: No results for input(s): CHOL, HDL, LDLCALC, TRIG, CHOLHDL, LDLDIRECT in the last 72 hours. Thyroid Function Tests: No results for input(s): TSH, T4TOTAL, FREET4, T3FREE, THYROIDAB in the last 72 hours. Anemia Panel: No results for input(s): VITAMINB12, FOLATE, FERRITIN, TIBC, IRON, RETICCTPCT in the last 72 hours. Sepsis Labs: No results for input(s): PROCALCITON, LATICACIDVEN in the last 168 hours.  No results found for this or any previous visit (from the past 240 hour(s)).   Radiology Studies: Dg  Tibia/fibula Right Port  Result Date: 11/03/2017 CLINICAL DATA:  Wound.  Abscess. EXAM: PORTABLE RIGHT TIBIA AND FIBULA - 2 VIEW COMPARISON:  Right ankle radiographs - earlier same day FINDINGS: No fracture or dislocation. Limited visualization of the adjacent knee and ankle is normal given obliquity and large field of view. Extensive vascular calcifications within the lower leg. Mild soft tissue swelling about the lower leg. No  foci of subcutaneous emphysema. No radiopaque foreign body. No discrete areas of osteolysis to suggest osteomyelitis. IMPRESSION: No definitive radiographic correlate for reported area of lower leg wound. No radiographic evidence of osteomyelitis. Electronically Signed   By: Sandi Mariscal M.D.   On: 11/03/2017 11:11   Dg Ankle Right Port  Result Date: 11/03/2017 CLINICAL DATA:  Wound/abscess EXAM: PORTABLE RIGHT ANKLE - 2 VIEW COMPARISON:  Right tibia and fibular radiographs - earlier same day FINDINGS: No fracture or dislocation. Joint spaces appear preserved. The ankle mortise appears preserved. No definite ankle joint effusion. Potential minimal amount of soft tissue swelling about the anterolateral aspect of the ankle. No associated radiopaque foreign body or subcutaneous emphysema. No discrete areas of osteolysis to suggest osteomyelitis. Vascular calcifications.  Small plantar calcaneal spur IMPRESSION: Potential minimal amount of soft tissue swelling about the anterolateral aspect of the ankle without associated fracture, radiopaque foreign body or radiographic evidence of osteomyelitis. Electronically Signed   By: Sandi Mariscal M.D.   On: 11/03/2017 11:12   Scheduled Meds: . aspirin  325 mg Oral Daily  . atorvastatin  20 mg Oral Daily  . carvedilol  25 mg Oral BID WC  . enoxaparin (LOVENOX) injection  40 mg Subcutaneous Q24H  . feeding supplement (PRO-STAT SUGAR FREE 64)  30 mL Oral BID  . ferrous sulfate  325 mg Oral Q breakfast  . furosemide  20 mg Oral Daily  .  insulin aspart  0-5 Units Subcutaneous QHS  . insulin aspart  0-9 Units Subcutaneous TID WC  . lisinopril  40 mg Oral Daily  . nutrition supplement (JUVEN)  1 packet Oral BID BM   Continuous Infusions: . ceFEPime (MAXIPIME) IV 1 g (11/04/17 0601)  . metronidazole Stopped (11/04/17 0231)  . vancomycin Stopped (11/03/17 2310)     LOS: 1 day   Kerney Elbe, DO [facility] Pager [phone]  If 7PM-7AM, please contact night-coverage [email or web address] Password [email or web address] 11/04/2017, 8:20 AM

## 2017-11-04 NOTE — Consult Note (Signed)
[place] consulted, reviewed records.  Patient is pending orthopedic evaluation. Will order topical care until Dr. Sharol Given evaluates patient.  Exposed tendon on the RLE.  Will need moisture to maintain tendon viability.   Orders entered, will follow up after orthopedic evaluation.  [place], [place], [place]

## 2017-11-05 DIAGNOSIS — L089 Local infection of the skin and subcutaneous tissue, unspecified: Secondary | ICD-10-CM

## 2017-11-05 DIAGNOSIS — T8149XA Infection following a procedure, other surgical site, initial encounter: Secondary | ICD-10-CM

## 2017-11-05 DIAGNOSIS — I83013 Varicose veins of right lower extremity with ulcer of ankle: Secondary | ICD-10-CM

## 2017-11-05 DIAGNOSIS — L97909 Non-pressure chronic ulcer of unspecified part of unspecified lower leg with unspecified severity: Secondary | ICD-10-CM

## 2017-11-05 DIAGNOSIS — L97315 Non-pressure chronic ulcer of right ankle with muscle involvement without evidence of necrosis: Secondary | ICD-10-CM

## 2017-11-05 DIAGNOSIS — I70261 Atherosclerosis of native arteries of extremities with gangrene, right leg: Secondary | ICD-10-CM

## 2017-11-05 DIAGNOSIS — T148XXA Other injury of unspecified body region, initial encounter: Secondary | ICD-10-CM

## 2017-11-05 DIAGNOSIS — E11622 Type 2 diabetes mellitus with other skin ulcer: Secondary | ICD-10-CM

## 2017-11-05 LAB — COMPREHENSIVE METABOLIC PANEL
ALBUMIN: 2.4 g/dL — AB (ref 3.5–5.0)
ALK PHOS: 46 U/L (ref 38–126)
ALT: 9 U/L — ABNORMAL LOW (ref 14–54)
ANION GAP: 6 (ref 5–15)
AST: 14 U/L — ABNORMAL LOW (ref 15–41)
BILIRUBIN TOTAL: 0.8 mg/dL (ref 0.3–1.2)
BUN: 13 mg/dL (ref 6–20)
CALCIUM: 7.9 mg/dL — AB (ref 8.9–10.3)
CO2: 28 mmol/L (ref 22–32)
Chloride: 102 mmol/L (ref 101–111)
Creatinine, Ser: 0.59 mg/dL (ref 0.44–1.00)
GLUCOSE: 139 mg/dL — AB (ref 65–99)
Potassium: 3.7 mmol/L (ref 3.5–5.1)
Sodium: 136 mmol/L (ref 135–145)
TOTAL PROTEIN: 5.6 g/dL — AB (ref 6.5–8.1)

## 2017-11-05 LAB — CBC WITH DIFFERENTIAL/PLATELET
Basophils Absolute: 0 10*3/uL (ref 0.0–0.1)
Basophils Relative: 1 %
Eosinophils Absolute: 0.2 10*3/uL (ref 0.0–0.7)
Eosinophils Relative: 6 %
HEMATOCRIT: 30.7 % — AB (ref 36.0–46.0)
HEMOGLOBIN: 10.3 g/dL — AB (ref 12.0–15.0)
LYMPHS ABS: 1 10*3/uL (ref 0.7–4.0)
Lymphocytes Relative: 28 %
MCH: 31.3 pg (ref 26.0–34.0)
MCHC: 33.6 g/dL (ref 30.0–36.0)
MCV: 93.3 fL (ref 78.0–100.0)
MONO ABS: 0.4 10*3/uL (ref 0.1–1.0)
MONOS PCT: 10 %
NEUTROS PCT: 55 %
Neutro Abs: 2.1 10*3/uL (ref 1.7–7.7)
Platelets: 211 10*3/uL (ref 150–400)
RBC: 3.29 MIL/uL — ABNORMAL LOW (ref 3.87–5.11)
RDW: 15 % (ref 11.5–15.5)
WBC: 3.7 10*3/uL — AB (ref 4.0–10.5)

## 2017-11-05 LAB — GLUCOSE, CAPILLARY
GLUCOSE-CAPILLARY: 113 mg/dL — AB (ref 65–99)
GLUCOSE-CAPILLARY: 155 mg/dL — AB (ref 65–99)
Glucose-Capillary: 173 mg/dL — ABNORMAL HIGH (ref 65–99)
Glucose-Capillary: 112 mg/dL — ABNORMAL HIGH (ref 65–99)
Glucose-Capillary: 132 mg/dL — ABNORMAL HIGH (ref 65–99)

## 2017-11-05 LAB — PHOSPHORUS: PHOSPHORUS: 2.9 mg/dL (ref 2.5–4.6)

## 2017-11-05 LAB — LIPID PANEL
CHOL/HDL RATIO: 2.1 ratio
Cholesterol: 70 mg/dL (ref 0–200)
HDL: 33 mg/dL — AB (ref >40)
LDL CALC: 30 mg/dL (ref 0–99)
Triglycerides: 35 mg/dL (ref <150)
VLDL: 7 mg/dL (ref 0–40)

## 2017-11-05 LAB — MAGNESIUM: Magnesium: 1.7 mg/dL (ref 1.7–2.4)

## 2017-11-05 NOTE — Progress Notes (Signed)
Nutrition Follow-up  DOCUMENTATION CODES:   Not applicable  INTERVENTION:   - Continue Prostat BID, each supplement provides 100 kcal and 15 grams of protein.  - Continue Juven packet BID to support wound healing  NUTRITION DIAGNOSIS:   Increased nutrient needs related to wound healing as evidenced by estimated needs. Ongoing  GOAL:   Patient will meet greater than or equal to 90% of their needs Met  MONITOR:   PO intake, Supplement acceptance, Labs, Weight trends, I & O's, Skin  REASON FOR ASSESSMENT:   Consult Wound healing  ASSESSMENT:   81 y/o female PMHx DM2, Aortic Stenosis, Nonischemic cardiomyopathy, chronic venous stasis ulcers, HLD, HTN, CAD. Presented for evaluation of R leg wound after being seen at wound clinic and found to have infection. Wound has been presented for many weeks. Admitted to hospital for management and RD consulted for wound healing.   Pt reported to RD previously on 11/10 that she had not experienced loss of appetite and had been eating normal amounts of food for meals. She continues to deny loss of appetite along with n/v/c/d. Pt says that she has been eating all of her meals since admission.  Pt endorses a UBW of 157 lbs and is not aware of any recent weight changes. Per chart, pts weight has remained stable for the past two years.   She has been consuming Prostat BID along with Juven packets BID since ordered on 11/10. She says that she is happy to do anything that will aid in her wound healing.   Medications- Lovenox, Lasix, Novolog  Labs- CBGs: 110-157-112, Ca 7.9 (L)  NUTRITION - FOCUSED PHYSICAL EXAM:    Most Recent Value  Orbital Region  No depletion  Upper Arm Region  Mild depletion  Thoracic and Lumbar Region  No depletion  Buccal Region  No depletion  Temple Region  Moderate depletion  Clavicle Bone Region  Moderate depletion  Clavicle and Acromion Bone Region  Mild depletion  Scapular Bone Region  Mild depletion   Dorsal Hand  Mild depletion  Patellar Region  No depletion  Anterior Thigh Region  No depletion  Posterior Calf Region  No depletion  Edema (RD Assessment)  Mild [RLE, LLE mild pitting]  Hair  Reviewed  Eyes  Reviewed  Mouth  Reviewed  Skin  Reviewed  Nails  Reviewed       Diet Order:  Diet heart healthy/carb modified Room service appropriate? Yes; Fluid consistency: Thin  EDUCATION NEEDS:   Not appropriate for education at this time  Skin:  Skin Assessment: Skin Integrity Issues: Skin Integrity Issues:: Diabetic Ulcer Diabetic Ulcer: RLE, wound on LLE  Last BM:  11/11  Height:   Ht Readings from Last 1 Encounters:  11/02/17 5' 5"  (1.651 m)    Weight:   Wt Readings from Last 1 Encounters:  11/02/17 156 lb (70.8 kg)    Ideal Body Weight:  56.82 kg  BMI:  Body mass index is 25.96 kg/m.  Estimated Nutritional Needs:   Kcal:  1800-2000 kcals  Protein:  85-95 grams  Fluid:  1.8-2 [place] [facility] Intern Pager: [phone] 11/05/2017 3:50 PM

## 2017-11-05 NOTE — Care Management Note (Addendum)
Case Management Note  Patient Details  Name: Terilyn Sano MRN: [id number] Date of Birth: 1936-09-12  Subjective/Objective:    Left wound infection with cellulitis,  DM, HTN, CHF               Action/Plan: Discharge Planning: NCM spoke to pt and she lives at home with dtr, Neoma Laming. States she had HH in the past with [facility]. She states she does not have any DME at home. Waiting PT evaluation. Pt may benefit from RW with seat. Will need HHRN, PT and aide orders with F2F.   [facility] delivered RW with seat to room.  PCP Chevis Pretty MD  Expected Discharge Date:                  Expected Discharge Plan:  [place]  In-House Referral:  NA  Discharge planning Services  CM Consult  Post Acute Care Choice:  Home Health Choice offered to:  Patient  DME Arranged:  [place] with Seat DME Agency:  [facility]  HH Arranged:  PT, RN, aide [place] Agency:  [place]  Status of Service:  In process, will continue to follow  If discussed at Long Length of Stay Meetings, dates discussed:  11/06/2017  Additional Comments:  11/06/2017 1119 Contacted [facility] with new referral for [facility] and RW with seat.    Erenest Rasher, RN 11/05/2017, 11:20 AM [phone]

## 2017-11-05 NOTE — Plan of Care (Signed)
  Clinical Measurements: Ability to maintain clinical measurements within normal limits will improve 11/05/2017 1118 - Progressing by Williams Che, RN   Activity: Risk for activity intolerance will decrease 11/05/2017 1118 - Progressing by Williams Che, RN   Nutrition: Adequate nutrition will be maintained 11/05/2017 1118 - Progressing by Williams Che, RN   Coping: Level of anxiety will decrease 11/05/2017 1118 - Progressing by Williams Che, RN

## 2017-11-05 NOTE — Progress Notes (Signed)
PROGRESS NOTE    Emily Wall  MVE:[id number] DOB: [birth year] DOA: 11/02/2017 PCP: Emily Pretty, FNP  Brief Narrative:  The patient is a 81 yo Female with a PMH of Diabetes Mellitus Type 2, Non-ischemic Cardiomyopathy, Aortic Stenosis, Chronic Venous Stasis, HTN, HLD, Hypothyroidism, Osteoporosis and other comorbids who presented to ED at recommendation from a [place] for a Right Leg Wound Infection. She was placed on Antibiotics for a Leg Wound Infection with Cellulitis and placed on IV Vancomycin, IV Cefepime and IV Metronidazole. Case was discussed with Orthopedic Surgery Dr. Erlinda Hong who reviewed admission picture and recommended evaluation by Dr. Sharol Given and will happen, Monday 11/12. In the interm will C/w IV Abx and await WOC nurse consultation. WOC will order topical care until Dr. Sharol Given evaluates the patient as she has exposed tendon to maintain tendon viability. Case was discussed with Dr. Oneida Alar and will hold off on formally consulting Vascular Surgery until Orthopedic Evaluation. Dr. Sharol Given evaluated and requesting [place] nurse to apply a Profore wrap to both legs to try to decrease swelling to improve arterial flow and have it changed Wednesday. Plan is for debridement of posterior calf necrotic ulcer and Achilles tendon on Right with application of split-thickness skin graft and a wound VAC on Friday 11/16.   Assessment & Plan:   Principal Problem:   Wound infection Active Problems:   Essential hypertension   Aortic valve disorder   Diabetes mellitus (HCC)   Hypoalbuminemia   Venous stasis ulcer (HCC)   Diabetic leg ulcer (HCC)   Atherosclerosis of native arteries of extremities with gangrene, right leg (HCC)  Leg Wound Infection with cellulitis 2/2 Venous Stasis ulcers in diabetic  -Acute. Patient presents with worsening lower extremity wounds.   -Recently treated for cellulitis with some improvement on ciprofloxacin.  Advised to come in for further evaluation due to  location of wounds. - Admitted to Med Surg - Lower extremity order set initiated - Follow-up Blood Cultures; Blood Cx x 2 show NGTD at 1 day - ESR was 26 and CRP was 1.8  - Continue Empiric antibiotics of Vancomycin, Cefepime, and metronidazole IV - Wound Care Consult recommends topical care with moisture to maintain tendon viability  - ABI's ordered but Right ABI not done 2/2 to Pain with Compression however wave forms and great toe pressure indicates inadequate flow. Left ABI not done 2/2 to Non compressible vessels. Posterior Tibial Signal Most likely venous flow. Great Toe pressure is abnormal. - Discussed with Vascular Surgery Dr. Oneida Alar and will hold of formal consultation until after evaluated by Orthopedic Surgery Dr. Sharol Given evaluates;  - Consulted Orthopedic Dr. Erlinda Hong for evaluation however he recommended that she be evaluated by Dr. Sharol Given which will happen on Monday 11/05/17.  - C/w Abx in the Interm  - Dr. Sharol Given evaluated and  requesting [place] nurse to apply a Profore wrap to both legs to try to decrease swelling to improve arterial flow and have it changed Wednesday.  -Plan is for debridement of posterior calf necrotic ulcer and Achilles tendon on Right with application of split-thickness skin graft and a wound VAC.  -Per Ortho will apply a PRAFO on the Right to unload pressure from posterior calf. -Nutritionist consulted for hypoalbuminemia as patient has poor wound healing and they are recommending Prostat BID and Juven Packet BID  Anemia of Chronic Disease - Patient's Hb was 10.4 on Admission.   - No reported complaints of bleeding. - Hb/Hct went from 10.4/31.9 -> 10.2/31.6 -> 9.7/29.9 -> 10.3/30.7 - Continue  Iron Supplementation with Ferrous Sulfate 325 mg po Daily - Repeat CBC in AM   Essential Hypertension - Controlled  - C/w Carvedilol 25 mg po BID, Furosemide 20 mg po Daily, and with Lisinopril 40 mg po Daily    Diabetes Mellitus Type 2 - Patient is supposed to be on  Metformin and Januvia, but notes that the Januvia prescription is too expensive for her to afford and has not started this. - Checked Hemoglobin A1c and was 8.1 - Hypoglycemic protocols - Hold Metformin and Januvia - Started patient on Sensitive Novolog SSI  - CBG's ranging from 112-132   Combined Systolic and Diastolic CHF / Aortic Stenosis - No signs of the patient being fluid overloaded.  - Last EF noted to be reported 45% with Grade 2 Diastolic Dysfunction and Moderate aortic Stenosis in 05/2017. - C/w Carvedilol 25 mg po BID, Furosemide 20 mg po Daily, and with Lisinopril 40 mg po Daily  - Strict I's and O's; Daily Weights ordered - Patient is +1.520 Liters; - Continue to Monitor Volume Status   Hyperlipidemia - Lipid Panel showed Cholesterol of 70, HDL of 33, LDL of 30, TG of 35, and VLDL of 7 - Continue Atorvastatin 20 mg po Daily  Hypoalbuminemia - Prealbumin was 6.1; Albumin was 2.4 this AM - Nutritionist consulted for Evaluation and Management - Appreciate Recc's and they recommend Prostat BID and Juven Packet BID  Hypomagnesemia -Patient's Mag Level was 1.5 and improved to 1.7 -Continue to Monitor and Replete as Necessary -Repeat Mag Level in AM  DVT prophylaxis: Enoxaparin 40 mg sq q24h Code Status: FULL CODE Family Communication: No family present at bedside  Disposition Plan: Remain Inpatient for debridement for on Friday  Consultants:   Orthopedic Surgery  Dietary/Nutritionist   Discussed Case with Vascular Surgery Dr. Oneida Alar    Procedures:  ABI ABI completed:Right ABI not ascertained secondary to pain with compression. However, waveforms and great toe pressure indicates inadequate flow.  Left ABI not ascertained secondary to non compressible vessels.  Posterior tibial signal most likely venous flow.  Great toe pressure is abnormal.       RIGHT    LEFT    PRESSURE WAVEFORM  PRESSURE WAVEFORM  BRACHIAL 111 T BRACHIAL 109 T  DP   DP 300     AT  M AT    PT  M PT 34 DM  PER   PER    GREAT TOE 21 NA GREAT TOE 57 NA    RIGHT LEFT  ABI  Non compressible    Antimicrobials: Anti-infectives (From admission, onward)   Start     Dose/Rate Route Frequency Ordered Stop   11/03/17 2200  vancomycin (VANCOCIN) 1,250 mg in sodium chloride 0.9 % 250 mL IVPB     1,250 mg 166.7 mL/hr over 90 Minutes Intravenous Every 24 hours 11/03/17 0110     11/03/17 1400  ceFEPIme (MAXIPIME) 1 g in dextrose 5 % 50 mL IVPB     1 g 100 mL/hr over 30 Minutes Intravenous Every 8 hours 11/03/17 0145     11/03/17 0200  metroNIDAZOLE (FLAGYL) IVPB 500 mg     500 mg 100 mL/hr over 60 Minutes Intravenous Every 8 hours 11/03/17 0137     11/03/17 0200  ceFEPIme (MAXIPIME) 2 g in dextrose 5 % 50 mL IVPB     2 g 100 mL/hr over 30 Minutes Intravenous  Once 11/03/17 0146 11/03/17 0503   11/03/17 0000  vancomycin (VANCOCIN) IVPB 1000 mg/200 mL  premix     1,000 mg 200 mL/hr over 60 Minutes Intravenous  Once 11/02/17 2355 11/03/17 0216     Subjective: Seen and examined at bedside and was sitting in chair bedside and was doing ok. Complained a little about pain. No Nausea or vomiting. No other concerns or complaints at this time.   Objective: Vitals:   11/04/17 1054 11/04/17 1407 11/04/17 2152 11/05/17 0848  BP: 124/76 117/63 132/66 (!) 155/74  Pulse: 82 82 82 93  Resp:  16 16   Temp:  98.8 F (37.1 C) 98.8 F (37.1 C)   TempSrc:  Oral Oral   SpO2:  100% 97%   Weight:      Height:        Intake/Output Summary (Last 24 hours) at 11/05/2017 1327 Last data filed at 11/04/2017 1535 Gross per 24 hour  Intake 50 ml  Output -  Net 50 ml   Filed Weights   11/02/17 2007  Weight: 70.8 kg (156 lb)   Examination: Physical Exam:  Constitutional: Pleasant AAF in NAD sitting in chair bedside Eyes: Sclerae anicteric. Lids normal.  ENMT: External ears and nose appear normal. MMM Neck: Supple with no JVD Respiratory: CTAB; No  wheezing/rales/rhonchi or crackles. Unlabored breathing Cardiovascular: RRR; Has a 3/6 Systolic Murmur. Minimal LE exdema Abdomen: Soft, NT, ND. Bowel sounds present GU: Deferred Musculoskeletal: No contractures. No cyanosis Skin: Bilateral LE ulcerations with Right worse than left with some purulence and foul smelling drainage. There is erythema and Right posterior ulceration with an exposed Achilles tendon.  Neurologic: CN 2-12 grossly intact. No appreciable focal deficits Psychiatric: Normal mood and affect. Intact judgement and insight  Data Reviewed: I have personally reviewed following labs and imaging studies  CBC: Recent Labs  Lab 11/02/17 2011 11/03/17 0825 11/04/17 0317 11/05/17 0539  WBC 5.0 4.8 3.9* 3.7*  NEUTROABS 3.0 3.1 2.4 2.1  HGB 10.4* 10.2* 9.7* 10.3*  HCT 31.9* 31.6* 29.9* 30.7*  MCV 91.4 91.9 91.4 93.3  PLT 202 208 202 016   Basic Metabolic Panel: Recent Labs  Lab 11/02/17 2011 11/03/17 0825 11/04/17 0317 11/05/17 0539  NA 131* 133* 136 136  K 3.5 3.8 3.5 3.7  CL 99* 100* 103 102  CO2 23 28 28 28   GLUCOSE 165* 136* 151* 139*  BUN 7 6 <5* 13  CREATININE 0.71 0.63 0.62 0.59  CALCIUM 8.1* 8.0* 7.7* 7.9*  MG  --  1.5* 1.8 1.7  PHOS  --  3.0 3.4 2.9   GFR: Estimated Creatinine Clearance: 57.2 mL/min (by C-G formula based on SCr of 0.59 mg/dL). Liver Function Tests: Recent Labs  Lab 11/02/17 2011 11/03/17 0825 11/04/17 0317 11/05/17 0539  AST 15 14* 11* 14*  ALT 10* 10* 9* 9*  ALKPHOS 54 48 43 46  BILITOT 0.6 0.5 0.5 0.8  PROT 6.1* 5.6* 5.6* 5.6*  ALBUMIN 2.6* 2.4* 2.2* 2.4*   No results for input(s): LIPASE, AMYLASE in the last 168 hours. No results for input(s): AMMONIA in the last 168 hours. Coagulation Profile: No results for input(s): INR, PROTIME in the last 168 hours. Cardiac Enzymes: No results for input(s): CKTOTAL, CKMB, CKMBINDEX, TROPONINI in the last 168 hours. BNP (last 3 results) No results for input(s): PROBNP in the  last 8760 hours. HbA1C: Recent Labs    11/03/17 0602  HGBA1C 8.1*   CBG: Recent Labs  Lab 11/04/17 1136 11/04/17 1719 11/04/17 2150 11/05/17 0716 11/05/17 1204  GLUCAP 146* 110* 157* 112* 132*   Lipid  Profile: Recent Labs    11/05/17 0539  CHOL 70  HDL 33*  LDLCALC 30  TRIG 35  CHOLHDL 2.1   Thyroid Function Tests: No results for input(s): TSH, T4TOTAL, FREET4, T3FREE, THYROIDAB in the last 72 hours. Anemia Panel: No results for input(s): VITAMINB12, FOLATE, FERRITIN, TIBC, IRON, RETICCTPCT in the last 72 hours. Sepsis Labs: No results for input(s): PROCALCITON, LATICACIDVEN in the last 168 hours.  Recent Results (from the past 240 hour(s))  Blood Cultures x 2 sites     Status: None (Preliminary result)   Collection Time: 11/03/17  1:10 AM  Result Value Ref Range Status   Specimen Description BLOOD RIGHT ANTECUBITAL  Final   Special Requests   Final    BOTTLES DRAWN AEROBIC AND ANAEROBIC Blood Culture adequate volume   Culture NO GROWTH 1 DAY  Final   Report Status PENDING  Incomplete  Blood Cultures x 2 sites     Status: None (Preliminary result)   Collection Time: 11/03/17  1:15 AM  Result Value Ref Range Status   Specimen Description BLOOD LEFT ANTECUBITAL  Final   Special Requests   Final    BOTTLES DRAWN AEROBIC AND ANAEROBIC Blood Culture results may not be optimal due to an excessive volume of blood received in culture bottles   Culture NO GROWTH 1 DAY  Final   Report Status PENDING  Incomplete     Radiology Studies: No results found. Scheduled Meds: . aspirin  325 mg Oral Daily  . atorvastatin  20 mg Oral Daily  . carvedilol  25 mg Oral BID WC  . enoxaparin (LOVENOX) injection  40 mg Subcutaneous Q24H  . feeding supplement (PRO-STAT SUGAR FREE 64)  30 mL Oral BID  . ferrous sulfate  325 mg Oral Q breakfast  . furosemide  20 mg Oral Daily  . insulin aspart  0-5 Units Subcutaneous QHS  . insulin aspart  0-9 Units Subcutaneous TID WC  . lisinopril   40 mg Oral Daily  . nutrition supplement (JUVEN)  1 packet Oral BID BM   Continuous Infusions: . ceFEPime (MAXIPIME) IV Stopped (11/05/17 0551)  . metronidazole Stopped (11/05/17 1048)  . vancomycin Stopped (11/04/17 2320)    LOS: 2 days   Kerney Elbe, DO [facility] Pager [phone]  If 7PM-7AM, please contact night-coverage [email or web address] Password [email or web address] 11/05/2017, 1:27 PM

## 2017-11-05 NOTE — H&P (View-Only) (Signed)
ORTHOPAEDIC CONSULTATION  REQUESTING PHYSICIAN: Kerney Elbe, DO  Chief Complaint: Presents complaining of chronic painful ulcers both lower extremities  HPI: Emily Wall is a 81 y.o. female who presents with massive necrotic ulcer posterior right ankle and calf with venous insufficiency swelling both lower extremities with bilateral arterial insufficiency right worse than left.  Most recent hemoglobin A1c 8.1.  Patient lives at home.  She is status post split thickness skin graft to the dorsum of the right ankle several years ago.  Past Medical History:  Diagnosis Date  . Aortic regurgitation    Mild  . Aortic stenosis    Moderate  . Coronary atherosclerosis of native coronary artery    Nonobstructive 2007  . Essential hypertension, benign   . Hyperlipidemia   . Hypothyroidism   . Nonischemic cardiomyopathy (HCC)    LVEF improved to 50-55%  . Osteoporosis   . Type 2 diabetes mellitus ([place])    Past Surgical History:  Procedure Laterality Date  . SKIN GRAFT     Social History   Socioeconomic History  . Marital status: Widowed    Spouse name: None  . Number of children: 2  . Years of education: None  . Highest education level: None  Social Needs  . Financial resource strain: None  . Food insecurity - worry: None  . Food insecurity - inability: None  . Transportation needs - medical: None  . Transportation needs - non-medical: None  Occupational History    Employer: RETIRED  Tobacco Use  . Smoking status: Former Smoker    Packs/day: 0.50    Years: 30.00    Pack years: 15.00    Types: Cigarettes    Last attempt to quit: 12/25/1988    Years since quitting: 28.8  . Smokeless tobacco: Never Used  Substance and Sexual Activity  . Alcohol use: No    Alcohol/week: 0.0 oz  . Drug use: No  . Sexual activity: No  Other Topics Concern  . None  Social History Narrative  . None   Family History  Problem Relation Age of Onset  . Colon cancer Mother   .  Colon cancer Brother   . Diabetes Brother   . Prostate cancer Brother    - negative except otherwise stated in the family history section No Known Allergies Prior to Admission medications   Medication Sig Start Date End Date Taking? Authorizing Provider  alendronate (FOSAMAX) 70 MG tablet TAKE 1 TABLET BY MOUTH ONCE A WEEK WITH A FULL GLASS OF WATER ON AN EMPTY STOMACH 02/19/17  Yes Hassell Done, Mary-Margaret, FNP  aspirin 325 MG tablet Take 325 mg by mouth daily.     Yes [provider]  atorvastatin (LIPITOR) 20 MG tablet Take 1 tablet (20 mg total) by mouth daily. 05/29/17  Yes Martin, Mary-Margaret, FNP  Calcium Carbonate-Vitamin D (CALCIUM + D PO) Take 1 tablet by mouth daily.     Yes [provider]  carvedilol (COREG) 25 MG tablet TAKE 1 TABLET (25 MG TOTAL) BY MOUTH 2 (TWO) TIMES DAILY WITH A MEAL. 06/04/17  Yes Martin, Mary-Margaret, FNP  cholecalciferol (VITAMIN D) 1000 UNITS tablet Take 1,000 Units by mouth daily.    Yes [provider]  ciprofloxacin (CIPRO) 500 MG tablet Take 1 tablet (500 mg total) 2 (two) times daily by mouth. 11/01/17  Yes Hassell Done, Mary-Margaret, FNP  ferrous sulfate 325 (65 FE) MG tablet Take 1 tablet (325 mg total) by mouth daily with breakfast. 05/29/17  Yes Hassell Done, Mary-Margaret,  FNP  furosemide (LASIX) 20 MG tablet Take 1 tablet (20 mg total) by mouth daily as needed. Patient taking differently: Take 20 mg daily as needed by mouth for fluid.  09/21/17  Yes Hassell Done, Mary-Margaret, FNP  HYDROcodone-acetaminophen (LORTAB) 5-325 MG tablet Take 1 tablet every 6 (six) hours as needed by mouth for moderate pain. 10/30/17  Yes Martin, Mary-Margaret, FNP  lisinopril (PRINIVIL,ZESTRIL) 40 MG tablet TAKE 1 TABLET BY MOUTH EVERY DAY 09/17/17  Yes Hassell Done, Mary-Margaret, FNP  metFORMIN (GLUCOPHAGE) 1000 MG tablet TAKE 1 TABLET (1,000 MG TOTAL) BY MOUTH 2 (TWO) TIMES DAILY WITH A MEAL. 09/26/17  Yes Gottschalk, Ashly M, DO  sitaGLIPtin (JANUVIA) 100 MG tablet Take  100 mg daily by mouth.   Yes [provider]  triamcinolone cream (KENALOG) 0.1 % APPLY TO AFFECTED AREA TWICE A DAY Patient taking differently: APPLY TO AFFECTED AREA TWICE A DAY AS NEEDED FOR RASH 07/30/17  Yes Chevis Pretty, FNP   Dg Tibia/fibula Right Port  Result Date: 11/03/2017 CLINICAL DATA:  Wound.  Abscess. EXAM: PORTABLE RIGHT TIBIA AND FIBULA - 2 VIEW COMPARISON:  Right ankle radiographs - earlier same day FINDINGS: No fracture or dislocation. Limited visualization of the adjacent knee and ankle is normal given obliquity and large field of view. Extensive vascular calcifications within the lower leg. Mild soft tissue swelling about the lower leg. No foci of subcutaneous emphysema. No radiopaque foreign body. No discrete areas of osteolysis to suggest osteomyelitis. IMPRESSION: No definitive radiographic correlate for reported area of lower leg wound. No radiographic evidence of osteomyelitis. Electronically Signed   By: Sandi Mariscal M.D.   On: 11/03/2017 11:11   Dg Ankle Right Port  Result Date: 11/03/2017 CLINICAL DATA:  Wound/abscess EXAM: PORTABLE RIGHT ANKLE - 2 VIEW COMPARISON:  Right tibia and fibular radiographs - earlier same day FINDINGS: No fracture or dislocation. Joint spaces appear preserved. The ankle mortise appears preserved. No definite ankle joint effusion. Potential minimal amount of soft tissue swelling about the anterolateral aspect of the ankle. No associated radiopaque foreign body or subcutaneous emphysema. No discrete areas of osteolysis to suggest osteomyelitis. Vascular calcifications.  Small plantar calcaneal spur IMPRESSION: Potential minimal amount of soft tissue swelling about the anterolateral aspect of the ankle without associated fracture, radiopaque foreign body or radiographic evidence of osteomyelitis. Electronically Signed   By: Sandi Mariscal M.D.   On: 11/03/2017 11:12   - pertinent xrays, CT, MRI studies were reviewed and independently  interpreted  Positive ROS: All other systems have been reviewed and were otherwise negative with the exception of those mentioned in the HPI and as above.  Physical Exam: General: Alert, no acute distress Psychiatric: Patient is competent for consent with normal mood and affect Lymphatic: No axillary or cervical lymphadenopathy Cardiovascular: Edema bilateral lower extremities. Respiratory: No cyanosis, no use of accessory musculature GI: No organomegaly, abdomen is soft and non-tender  Skin: Patient has a massive ulcer over the Achilles tendon with exposed Tendon.  The ulcer is 3 cm wide and 6 cm in length.  Proximal to this she has a large ischemic posterior calf ulcer which appears superficial.  Patient has no decubitus heel ulcers bilaterally.   Neurologic: Patient does not have protective sensation bilateral lower extremities.   MUSCULOSKELETAL:  Review of the ankle brachial indices show arterial circulation worse on the right than the left.  Inadequate great toe pressure worse on the right than the left.  Patient does have a palpable dorsalis pedis pulse on the left,  dorsalis pedis pulse is not palpable on the right.  Patient has mixed arterial and venous insufficiency worse on the right leg than the left leg.  There is pitting edema of both legs.  There is clear weeping edema from both legs.  Assessment: Assessment: Mixed arterial venous insufficiency worse on the right leg and the left leg with large ischemic ulcer over the Achilles and posterior calf on the right.  Plan: Plan: We will have wound ostomy continence nursing apply a Profore wrap to both legs.  Will try to decrease swelling to improve arterial inflow.  We will have the Profore placed today and changed on Wednesday with surgery on Friday.  Plan for debridement of the posterior calf necrotic ulcer on the right debridement of the Achilles tendon on the right with application of split-thickness skin graft and a wound VAC.   Will apply a PRAFO on the right to unload pressure from the posterior calf.  Thank you for the consult and the opportunity to see Emily Wall, [place] [phone] 6:45 AM

## 2017-11-05 NOTE — Consult Note (Signed)
[facility] Nurse wound consult note Reason for Consult: placement of bilateral 4 layer compression wraps  Wound type: mixed venous and arterial etiology.  Dr. Sharol Given has reviewed and ordered 4 layer compression Pressure Injury POA: NA Wound bed: did not visualize wounds. Dressings placed per Dr. Jess Barters orders by bedside nurse  Drainage (amount, consistency, odor) not assessed Periwound: no assessed  Dressing procedure/placement/frequency: kerlix wraps removed to avoid excessive pressure in this area. 4 layer compression wraps applied bilaterally  PRAFO in room for RLE, applied after compression wraps applied.   To be changed again on Wednesday by [facility] nurse. Surgery planned for Friday.  [place], Emily Wall, Emily Wall

## 2017-11-05 NOTE — Consult Note (Signed)
ORTHOPAEDIC CONSULTATION  REQUESTING PHYSICIAN: Kerney Elbe, DO  Chief Complaint: Presents complaining of chronic painful ulcers both lower extremities  HPI: Emily Wall is a 81 y.o. female who presents with massive necrotic ulcer posterior right ankle and calf with venous insufficiency swelling both lower extremities with bilateral arterial insufficiency right worse than left.  Most recent hemoglobin A1c 8.1.  Patient lives at home.  She is status post split thickness skin graft to the dorsum of the right ankle several years ago.  Past Medical History:  Diagnosis Date  . Aortic regurgitation    Mild  . Aortic stenosis    Moderate  . Coronary atherosclerosis of native coronary artery    Nonobstructive 2007  . Essential hypertension, benign   . Hyperlipidemia   . Hypothyroidism   . Nonischemic cardiomyopathy (HCC)    LVEF improved to 50-55%  . Osteoporosis   . Type 2 diabetes mellitus ([place])    Past Surgical History:  Procedure Laterality Date  . SKIN GRAFT     Social History   Socioeconomic History  . Marital status: Widowed    Spouse name: None  . Number of children: 2  . Years of education: None  . Highest education level: None  Social Needs  . Financial resource strain: None  . Food insecurity - worry: None  . Food insecurity - inability: None  . Transportation needs - medical: None  . Transportation needs - non-medical: None  Occupational History    Employer: RETIRED  Tobacco Use  . Smoking status: Former Smoker    Packs/day: 0.50    Years: 30.00    Pack years: 15.00    Types: Cigarettes    Last attempt to quit: 12/25/1988    Years since quitting: 28.8  . Smokeless tobacco: Never Used  Substance and Sexual Activity  . Alcohol use: No    Alcohol/week: 0.0 oz  . Drug use: No  . Sexual activity: No  Other Topics Concern  . None  Social History Narrative  . None   Family History  Problem Relation Age of Onset  . Colon cancer Mother   .  Colon cancer Brother   . Diabetes Brother   . Prostate cancer Brother    - negative except otherwise stated in the family history section No Known Allergies Prior to Admission medications   Medication Sig Start Date End Date Taking? Authorizing Provider  alendronate (FOSAMAX) 70 MG tablet TAKE 1 TABLET BY MOUTH ONCE A WEEK WITH A FULL GLASS OF WATER ON AN EMPTY STOMACH 02/19/17  Yes Hassell Done, Mary-Margaret, FNP  aspirin 325 MG tablet Take 325 mg by mouth daily.     Yes [provider]  atorvastatin (LIPITOR) 20 MG tablet Take 1 tablet (20 mg total) by mouth daily. 05/29/17  Yes Martin, Mary-Margaret, FNP  Calcium Carbonate-Vitamin D (CALCIUM + D PO) Take 1 tablet by mouth daily.     Yes [provider]  carvedilol (COREG) 25 MG tablet TAKE 1 TABLET (25 MG TOTAL) BY MOUTH 2 (TWO) TIMES DAILY WITH A MEAL. 06/04/17  Yes Martin, Mary-Margaret, FNP  cholecalciferol (VITAMIN D) 1000 UNITS tablet Take 1,000 Units by mouth daily.    Yes [provider]  ciprofloxacin (CIPRO) 500 MG tablet Take 1 tablet (500 mg total) 2 (two) times daily by mouth. 11/01/17  Yes Hassell Done, Mary-Margaret, FNP  ferrous sulfate 325 (65 FE) MG tablet Take 1 tablet (325 mg total) by mouth daily with breakfast. 05/29/17  Yes Hassell Done, Mary-Margaret,  FNP  furosemide (LASIX) 20 MG tablet Take 1 tablet (20 mg total) by mouth daily as needed. Patient taking differently: Take 20 mg daily as needed by mouth for fluid.  09/21/17  Yes Hassell Done, Mary-Margaret, FNP  HYDROcodone-acetaminophen (LORTAB) 5-325 MG tablet Take 1 tablet every 6 (six) hours as needed by mouth for moderate pain. 10/30/17  Yes Martin, Mary-Margaret, FNP  lisinopril (PRINIVIL,ZESTRIL) 40 MG tablet TAKE 1 TABLET BY MOUTH EVERY DAY 09/17/17  Yes Hassell Done, Mary-Margaret, FNP  metFORMIN (GLUCOPHAGE) 1000 MG tablet TAKE 1 TABLET (1,000 MG TOTAL) BY MOUTH 2 (TWO) TIMES DAILY WITH A MEAL. 09/26/17  Yes Gottschalk, Ashly M, DO  sitaGLIPtin (JANUVIA) 100 MG tablet Take  100 mg daily by mouth.   Yes [provider]  triamcinolone cream (KENALOG) 0.1 % APPLY TO AFFECTED AREA TWICE A DAY Patient taking differently: APPLY TO AFFECTED AREA TWICE A DAY AS NEEDED FOR RASH 07/30/17  Yes Chevis Pretty, FNP   Dg Tibia/fibula Right Port  Result Date: 11/03/2017 CLINICAL DATA:  Wound.  Abscess. EXAM: PORTABLE RIGHT TIBIA AND FIBULA - 2 VIEW COMPARISON:  Right ankle radiographs - earlier same day FINDINGS: No fracture or dislocation. Limited visualization of the adjacent knee and ankle is normal given obliquity and large field of view. Extensive vascular calcifications within the lower leg. Mild soft tissue swelling about the lower leg. No foci of subcutaneous emphysema. No radiopaque foreign body. No discrete areas of osteolysis to suggest osteomyelitis. IMPRESSION: No definitive radiographic correlate for reported area of lower leg wound. No radiographic evidence of osteomyelitis. Electronically Signed   By: Sandi Mariscal M.D.   On: 11/03/2017 11:11   Dg Ankle Right Port  Result Date: 11/03/2017 CLINICAL DATA:  Wound/abscess EXAM: PORTABLE RIGHT ANKLE - 2 VIEW COMPARISON:  Right tibia and fibular radiographs - earlier same day FINDINGS: No fracture or dislocation. Joint spaces appear preserved. The ankle mortise appears preserved. No definite ankle joint effusion. Potential minimal amount of soft tissue swelling about the anterolateral aspect of the ankle. No associated radiopaque foreign body or subcutaneous emphysema. No discrete areas of osteolysis to suggest osteomyelitis. Vascular calcifications.  Small plantar calcaneal spur IMPRESSION: Potential minimal amount of soft tissue swelling about the anterolateral aspect of the ankle without associated fracture, radiopaque foreign body or radiographic evidence of osteomyelitis. Electronically Signed   By: Sandi Mariscal M.D.   On: 11/03/2017 11:12   - pertinent xrays, CT, MRI studies were reviewed and independently  interpreted  Positive ROS: All other systems have been reviewed and were otherwise negative with the exception of those mentioned in the HPI and as above.  Physical Exam: General: Alert, no acute distress Psychiatric: Patient is competent for consent with normal mood and affect Lymphatic: No axillary or cervical lymphadenopathy Cardiovascular: Edema bilateral lower extremities. Respiratory: No cyanosis, no use of accessory musculature GI: No organomegaly, abdomen is soft and non-tender  Skin: Patient has a massive ulcer over the Achilles tendon with exposed Tendon.  The ulcer is 3 cm wide and 6 cm in length.  Proximal to this she has a large ischemic posterior calf ulcer which appears superficial.  Patient has no decubitus heel ulcers bilaterally.   Neurologic: Patient does not have protective sensation bilateral lower extremities.   MUSCULOSKELETAL:  Review of the ankle brachial indices show arterial circulation worse on the right than the left.  Inadequate great toe pressure worse on the right than the left.  Patient does have a palpable dorsalis pedis pulse on the left,  dorsalis pedis pulse is not palpable on the right.  Patient has mixed arterial and venous insufficiency worse on the right leg than the left leg.  There is pitting edema of both legs.  There is clear weeping edema from both legs.  Assessment: Assessment: Mixed arterial venous insufficiency worse on the right leg and the left leg with large ischemic ulcer over the Achilles and posterior calf on the right.  Plan: Plan: We will have wound ostomy continence nursing apply a Profore wrap to both legs.  Will try to decrease swelling to improve arterial inflow.  We will have the Profore placed today and changed on Wednesday with surgery on Friday.  Plan for debridement of the posterior calf necrotic ulcer on the right debridement of the Achilles tendon on the right with application of split-thickness skin graft and a wound VAC.   Will apply a PRAFO on the right to unload pressure from the posterior calf.  Thank you for the consult and the opportunity to see Emily Wall, [place] [phone] 6:45 AM

## 2017-11-05 NOTE — Progress Notes (Signed)
Orthopedic Tech Progress Note Patient Details:  Emily Wall 1936-10-30 [id number]  Patient ID: Emily Wall, female   DOB: [birth year], 81 y.o.   MRN: [id number]   Emily Wall 11/05/2017, 9:35 AMCalled [employer] for right Prafo brace.

## 2017-11-06 ENCOUNTER — Ambulatory Visit (HOSPITAL_COMMUNITY): Payer: Medicare HMO | Admitting: Physical Therapy

## 2017-11-06 LAB — CBC WITH DIFFERENTIAL/PLATELET
BASOS PCT: 0 %
Basophils Absolute: 0 10*3/uL (ref 0.0–0.1)
Eosinophils Absolute: 0.2 10*3/uL (ref 0.0–0.7)
Eosinophils Relative: 4 %
HEMATOCRIT: 33 % — AB (ref 36.0–46.0)
Hemoglobin: 11 g/dL — ABNORMAL LOW (ref 12.0–15.0)
LYMPHS ABS: 1.1 10*3/uL (ref 0.7–4.0)
LYMPHS PCT: 22 %
MCH: 31 pg (ref 26.0–34.0)
MCHC: 33.3 g/dL (ref 30.0–36.0)
MCV: 93 fL (ref 78.0–100.0)
MONO ABS: 0.3 10*3/uL (ref 0.1–1.0)
MONOS PCT: 6 %
NEUTROS ABS: 3.4 10*3/uL (ref 1.7–7.7)
Neutrophils Relative %: 68 %
Platelets: 245 10*3/uL (ref 150–400)
RBC: 3.55 MIL/uL — ABNORMAL LOW (ref 3.87–5.11)
RDW: 15.1 % (ref 11.5–15.5)
WBC: 5 10*3/uL (ref 4.0–10.5)

## 2017-11-06 LAB — COMPREHENSIVE METABOLIC PANEL
ALK PHOS: 50 U/L (ref 38–126)
ALT: 14 U/L (ref 14–54)
ANION GAP: 7 (ref 5–15)
AST: 23 U/L (ref 15–41)
Albumin: 2.6 g/dL — ABNORMAL LOW (ref 3.5–5.0)
BILIRUBIN TOTAL: 0.7 mg/dL (ref 0.3–1.2)
BUN: 14 mg/dL (ref 6–20)
CALCIUM: 8 mg/dL — AB (ref 8.9–10.3)
CO2: 26 mmol/L (ref 22–32)
Chloride: 98 mmol/L — ABNORMAL LOW (ref 101–111)
Creatinine, Ser: 0.63 mg/dL (ref 0.44–1.00)
Glucose, Bld: 200 mg/dL — ABNORMAL HIGH (ref 65–99)
POTASSIUM: 4 mmol/L (ref 3.5–5.1)
Sodium: 131 mmol/L — ABNORMAL LOW (ref 135–145)
TOTAL PROTEIN: 6.7 g/dL (ref 6.5–8.1)

## 2017-11-06 LAB — GLUCOSE, CAPILLARY
GLUCOSE-CAPILLARY: 126 mg/dL — AB (ref 65–99)
GLUCOSE-CAPILLARY: 115 mg/dL — AB (ref 65–99)
GLUCOSE-CAPILLARY: 155 mg/dL — AB (ref 65–99)
Glucose-Capillary: 125 mg/dL — ABNORMAL HIGH (ref 65–99)

## 2017-11-06 LAB — MAGNESIUM: MAGNESIUM: 1.6 mg/dL — AB (ref 1.7–2.4)

## 2017-11-06 LAB — PHOSPHORUS: Phosphorus: 2.5 mg/dL (ref 2.5–4.6)

## 2017-11-06 MED ORDER — MAGNESIUM SULFATE 2 GM/50ML IV SOLN
2.0000 g | Freq: Once | INTRAVENOUS | Status: AC
  Administered 2017-11-06: 2 g via INTRAVENOUS
  Filled 2017-11-06: qty 50

## 2017-11-06 NOTE — Plan of Care (Signed)
  Activity: Risk for activity intolerance will decrease 11/06/2017 1634 - Progressing by Williams Che, RN   Nutrition: Adequate nutrition will be maintained 11/06/2017 1634 - Progressing by Williams Che, RN   Pain Managment: General experience of comfort will improve 11/06/2017 1634 - Progressing by Williams Che, RN   Safety: Ability to remain free from injury will improve 11/06/2017 1634 - Progressing by Williams Che, RN

## 2017-11-06 NOTE — Progress Notes (Signed)
Pharmacy Antibiotic Note  Annalucia Laino is a 81 y.o. female admitted on 11/02/2017 with right leg wounds.  Pharmacy has been consulted for Vancomycin/Cefepime dosing.  Continues on abx for chronic R leg wound infection. Plan on debride calf ulcer on Fri. Afebrile, WBC wnl.  Plan: Continue vancomycin 1,250 mg IV q24h Continue cefepime 1g IV q8h Continue Flagyl 500mg  IV Q8h per MD Monitor clinical picture, renal function, VT prn F/U C&S, abx deescalation / LOT   Height: 5\' 5"  (165.1 cm) Weight: 156 lb (70.8 kg) IBW/kg (Calculated) : 57  Temp (24hrs), Avg:98.6 F (37 C), Min:98.2 F (36.8 C), Max:99.2 F (37.3 C)  Recent Labs  Lab 11/02/17 2011 11/03/17 0825 11/04/17 0317 11/05/17 0539 11/06/17 0827  WBC 5.0 4.8 3.9* 3.7* 5.0  CREATININE 0.71 0.63 0.62 0.59  --     Estimated Creatinine Clearance: 57.2 mL/min (by C-G formula based on SCr of 0.59 mg/dL).    No Known Allergies  Reginia Naas 11/06/2017 9:43 AM

## 2017-11-06 NOTE — Progress Notes (Signed)
PROGRESS NOTE    Emily Wall  YME:[id number] DOB: 1936/10/09 DOA: 11/02/2017 PCP: Chevis Pretty, FNP  Brief Narrative:  The patient is a 81 yo Female with a PMH of Diabetes Mellitus Type 2, Non-ischemic Cardiomyopathy, Aortic Stenosis, Chronic Venous Stasis, HTN, HLD, Hypothyroidism, Osteoporosis and other comorbids who presented to ED at recommendation from a [place] for a Right Leg Wound Infection. She was placed on Antibiotics for a Leg Wound Infection with Cellulitis and placed on IV Vancomycin, IV Cefepime and IV Metronidazole. Case was discussed with Orthopedic Surgery Dr. Erlinda Hong who reviewed admission picture and recommended evaluation by Dr. Sharol Given and will happen, Monday 11/12. In the interm will C/w IV Abx and await WOC nurse consultation. WOC will order topical care until Dr. Sharol Given evaluates the patient as she has exposed tendon to maintain tendon viability. Case was discussed with Dr. Oneida Alar and will hold off on formally consulting Vascular Surgery until Orthopedic Evaluation. Dr. Sharol Given evaluated and requesting [place] nurse to apply a Profore wrap to both legs to try to decrease swelling to improve arterial flow and have it changed Wednesday. Plan is for debridement of posterior calf necrotic ulcer and Achilles tendon on Right with application of split-thickness skin graft and a wound VAC on Friday 11/16.   Assessment & Plan:   Principal Problem:   Wound infection Active Problems:   Essential hypertension   Aortic valve disorder   Diabetes mellitus (HCC)   Hypoalbuminemia   Venous stasis ulcer (HCC)   Diabetic leg ulcer (HCC)   Atherosclerosis of native arteries of extremities with gangrene, right leg (HCC)  Leg Wound Infection with cellulitis 2/2 Venous Stasis ulcers in diabetic  -Acute. Patient presents with worsening lower extremity wounds.   -Recently treated for cellulitis with some improvement on ciprofloxacin.  Advised to come in for further evaluation due to  location of wounds. - Admitted to Med Surg - Lower extremity order set initiated - Follow-up Blood Cultures; Blood Cx x 2 show NGTD at 3 day - ESR was 26 and CRP was 1.8  - Continue Empiric antibiotics of Vancomycin, Cefepime, and metronidazole IV - Initial Wound Care Consult recommended topical care with moisture to maintain tendon viability  - ABI's ordered but Right ABI not done 2/2 to Pain with Compression however wave forms and great toe pressure indicates inadequate flow. Left ABI not done 2/2 to Non compressible vessels. Posterior Tibial Signal Most likely venous flow. Great Toe pressure is abnormal. - Discussed with Vascular Surgery Dr. Oneida Alar and will hold of formal consultation until after evaluated by Orthopedic Surgery Dr. Sharol Given evaluates;  - Consulted Orthopedic Dr. Erlinda Hong for evaluation however he recommended that she be evaluated by Dr. Sharol Given which will happen on Monday 11/05/17.  - C/w Abx in the Interm  - Dr. Sharol Given evaluated and  requesting [place] nurse to apply a Profore wrap to both legs to try to decrease swelling to improve arterial flow and have it changed Wednesday.  -Plan is for debridement of posterior calf necrotic ulcer and Achilles tendon on Right with application of split-thickness skin graft and a wound VAC.  -Per Ortho will apply a PRAFO on the Right to unload pressure from posterior calf. -Nutritionist consulted for hypoalbuminemia as patient has poor wound healing and they are recommending Prostat BID and Juven Packet BID  Anemia of Chronic Disease - Patient's Hb was 10.4 on Admission.   - No reported complaints of bleeding. - Hb/Hct went from 10.4/31.9 -> 10.2/31.6 -> 9.7/29.9 -> 10.3/30.7 ->  11.0/33.0 - Continue Iron Supplementation with Ferrous Sulfate 325 mg po Daily - Repeat CBC in AM   Essential Hypertension - Controlled  - C/w Carvedilol 25 mg po BID, Furosemide 20 mg po Daily, and with Lisinopril 40 mg po Daily    Diabetes Mellitus Type 2 - Patient is  supposed to be on Metformin and Januvia, but notes that the Januvia prescription is too expensive for her to afford and has not started this. - Checked Hemoglobin A1c and was 8.1 - Hypoglycemic protocols - Hold Metformin and Januvia - Started patient on Sensitive Novolog SSI  - CBG's ranging from 115-155   Combined Systolic and Diastolic CHF / Aortic Stenosis - No signs of the patient being fluid overloaded.  - Last EF noted to be reported 45% with Grade 2 Diastolic Dysfunction and Moderate aortic Stenosis in 05/2017. - C/w Carvedilol 25 mg po BID, Furosemide 20 mg po Daily, and with Lisinopril 40 mg po Daily  - Strict I's and O's; Daily Weights ordered - Patient is +2.260 Liters; - Continue to Monitor Volume Status   Hyperlipidemia - Lipid Panel showed Cholesterol of 70, HDL of 33, LDL of 30, TG of 35, and VLDL of 7 - Continue Atorvastatin 20 mg po Daily  Hypoalbuminemia - Prealbumin was 6.1; Albumin was 2.6 this AM - Nutritionist consulted for Evaluation and Management - Appreciate Recc's and they recommend Prostat BID and Juven Packet BID  Hypomagnesemia -Patient's Mag Level was 1.6 -Replete with IV Mag Sulfate 2 grams -Continue to Monitor and Replete as Necessary -Repeat Mag Level in AM  DVT prophylaxis: Enoxaparin 40 mg sq q24h Code Status: FULL CODE Family Communication: No family present at bedside  Disposition Plan: Remain Inpatient for debridement for on Friday  Consultants:   Orthopedic Surgery  Dietary/Nutritionist   Discussed Case with Vascular Surgery Dr. Oneida Alar    Procedures:  ABI ABI completed:Right ABI not ascertained secondary to pain with compression. However, waveforms and great toe pressure indicates inadequate flow.  Left ABI not ascertained secondary to non compressible vessels.  Posterior tibial signal most likely venous flow.  Great toe pressure is abnormal.       RIGHT    LEFT    PRESSURE WAVEFORM  PRESSURE WAVEFORM  BRACHIAL 111  T BRACHIAL 109 T  DP   DP 300   AT  M AT    PT  M PT 34 DM  PER   PER    GREAT TOE 21 NA GREAT TOE 57 NA    RIGHT LEFT  ABI  Non compressible    Antimicrobials: Anti-infectives (From admission, onward)   Start     Dose/Rate Route Frequency Ordered Stop   11/03/17 2200  vancomycin (VANCOCIN) 1,250 mg in sodium chloride 0.9 % 250 mL IVPB     1,250 mg 166.7 mL/hr over 90 Minutes Intravenous Every 24 hours 11/03/17 0110     11/03/17 1400  ceFEPIme (MAXIPIME) 1 g in dextrose 5 % 50 mL IVPB     1 g 100 mL/hr over 30 Minutes Intravenous Every 8 hours 11/03/17 0145     11/03/17 0200  metroNIDAZOLE (FLAGYL) IVPB 500 mg     500 mg 100 mL/hr over 60 Minutes Intravenous Every 8 hours 11/03/17 0137     11/03/17 0200  ceFEPIme (MAXIPIME) 2 g in dextrose 5 % 50 mL IVPB     2 g 100 mL/hr over 30 Minutes Intravenous  Once 11/03/17 0146 11/03/17 0503   11/03/17 0000  vancomycin (  VANCOCIN) IVPB 1000 mg/200 mL premix     1,000 mg 200 mL/hr over 60 Minutes Intravenous  Once 11/02/17 2355 11/03/17 0216     Subjective: Seen and examined at bedside and sitting in her Chair and stated her CAM boot was not working well. No CP or SOB. No complaints and states she slept ok.   Objective: Vitals:   11/05/17 1727 11/05/17 2118 11/06/17 0522 11/06/17 1300  BP: 133/69 (!) 141/68 (!) 142/71 (!) 142/71  Pulse: 89 78 84 82  Resp:  18 18 18   Temp:  98.2 F (36.8 C) 99.2 F (37.3 C) 98.5 F (36.9 C)  TempSrc:  Oral Oral Oral  SpO2:  100% 98% 98%  Weight:      Height:        Intake/Output Summary (Last 24 hours) at 11/06/2017 1821 Last data filed at 11/06/2017 1255 Gross per 24 hour  Intake 500 ml  Output -  Net 500 ml   Filed Weights   11/02/17 2007  Weight: 70.8 kg (156 lb)   Examination: Physical Exam:  Constitutional: Pleasant AAF in NAD sitting at edge of bed and appears calm and comfortable.  Eyes: Sclerae anicteric. Lids normal ENMT: External ears and nose appear  normal. MMM Neck: Supple with no JVD Respiratory: CTAB; No appreciable wheezing/rales/rhonchi. Unlabored breathing Cardiovascular: RRR; Has a 3/6 Systolic Murmur.  Abdomen: Soft, NT, ND. Bowel sounds present GU: Deferred Musculoskeletal: No contractures; No cyanosis Skin: Bilateral Legs are wrapped with Compression dressings. Warm and dry Neurologic: CN 2-12 grossly intact. No appreciable focal deficits Psychiatric: Normal and pleasant mood and affect. Intact judgement and insight  Data Reviewed: I have personally reviewed following labs and imaging studies  CBC: Recent Labs  Lab 11/02/17 2011 11/03/17 0825 11/04/17 0317 11/05/17 0539 11/06/17 0827  WBC 5.0 4.8 3.9* 3.7* 5.0  NEUTROABS 3.0 3.1 2.4 2.1 3.4  HGB 10.4* 10.2* 9.7* 10.3* 11.0*  HCT 31.9* 31.6* 29.9* 30.7* 33.0*  MCV 91.4 91.9 91.4 93.3 93.0  PLT 202 208 202 211 923   Basic Metabolic Panel: Recent Labs  Lab 11/02/17 2011 11/03/17 0825 11/04/17 0317 11/05/17 0539 11/06/17 0827  NA 131* 133* 136 136 131*  K 3.5 3.8 3.5 3.7 4.0  CL 99* 100* 103 102 98*  CO2 23 28 28 28 26   GLUCOSE 165* 136* 151* 139* 200*  BUN 7 6 <5* 13 14  CREATININE 0.71 0.63 0.62 0.59 0.63  CALCIUM 8.1* 8.0* 7.7* 7.9* 8.0*  MG  --  1.5* 1.8 1.7 1.6*  PHOS  --  3.0 3.4 2.9 2.5   GFR: Estimated Creatinine Clearance: 57.2 mL/min (by C-G formula based on SCr of 0.63 mg/dL). Liver Function Tests: Recent Labs  Lab 11/02/17 2011 11/03/17 0825 11/04/17 0317 11/05/17 0539 11/06/17 0827  AST 15 14* 11* 14* 23  ALT 10* 10* 9* 9* 14  ALKPHOS 54 48 43 46 50  BILITOT 0.6 0.5 0.5 0.8 0.7  PROT 6.1* 5.6* 5.6* 5.6* 6.7  ALBUMIN 2.6* 2.4* 2.2* 2.4* 2.6*   No results for input(s): LIPASE, AMYLASE in the last 168 hours. No results for input(s): AMMONIA in the last 168 hours. Coagulation Profile: No results for input(s): INR, PROTIME in the last 168 hours. Cardiac Enzymes: No results for input(s): CKTOTAL, CKMB, CKMBINDEX, TROPONINI in the  last 168 hours. BNP (last 3 results) No results for input(s): PROBNP in the last 8760 hours. HbA1C: No results for input(s): HGBA1C in the last 72 hours. CBG: Recent Labs  Lab 11/05/17 1645 11/05/17 2157 11/06/17 0645 11/06/17 1121 11/06/17 1648  GLUCAP 113* 155* 126* 115* 155*   Lipid Profile: Recent Labs    11/05/17 0539  CHOL 70  HDL 33*  LDLCALC 30  TRIG 35  CHOLHDL 2.1   Thyroid Function Tests: No results for input(s): TSH, T4TOTAL, FREET4, T3FREE, THYROIDAB in the last 72 hours. Anemia Panel: No results for input(s): VITAMINB12, FOLATE, FERRITIN, TIBC, IRON, RETICCTPCT in the last 72 hours. Sepsis Labs: No results for input(s): PROCALCITON, LATICACIDVEN in the last 168 hours.  Recent Results (from the past 240 hour(s))  Blood Cultures x 2 sites     Status: None (Preliminary result)   Collection Time: 11/03/17  1:10 AM  Result Value Ref Range Status   Specimen Description BLOOD RIGHT ANTECUBITAL  Final   Special Requests   Final    BOTTLES DRAWN AEROBIC AND ANAEROBIC Blood Culture adequate volume   Culture NO GROWTH 3 DAYS  Final   Report Status PENDING  Incomplete  Blood Cultures x 2 sites     Status: None (Preliminary result)   Collection Time: 11/03/17  1:15 AM  Result Value Ref Range Status   Specimen Description BLOOD LEFT ANTECUBITAL  Final   Special Requests   Final    BOTTLES DRAWN AEROBIC AND ANAEROBIC Blood Culture results may not be optimal due to an excessive volume of blood received in culture bottles   Culture NO GROWTH 3 DAYS  Final   Report Status PENDING  Incomplete     Radiology Studies: No results found. Scheduled Meds: . aspirin  325 mg Oral Daily  . atorvastatin  20 mg Oral Daily  . carvedilol  25 mg Oral BID WC  . enoxaparin (LOVENOX) injection  40 mg Subcutaneous Q24H  . feeding supplement (PRO-STAT SUGAR FREE 64)  30 mL Oral BID  . ferrous sulfate  325 mg Oral Q breakfast  . furosemide  20 mg Oral Daily  . insulin aspart  0-5  Units Subcutaneous QHS  . insulin aspart  0-9 Units Subcutaneous TID WC  . lisinopril  40 mg Oral Daily  . nutrition supplement (JUVEN)  1 packet Oral BID BM   Continuous Infusions: . ceFEPime (MAXIPIME) IV Stopped (11/06/17 1528)  . metronidazole 500 mg (11/06/17 1746)  . vancomycin Stopped (11/05/17 2319)    LOS: 3 days   Kerney Elbe, DO [facility] Pager [phone]  If 7PM-7AM, please contact night-coverage [email or web address] Password [facility] 11/06/2017, 6:21 PM

## 2017-11-07 LAB — CBC WITH DIFFERENTIAL/PLATELET
BASOS ABS: 0 10*3/uL (ref 0.0–0.1)
Basophils Relative: 0 %
EOS PCT: 6 %
Eosinophils Absolute: 0.3 10*3/uL (ref 0.0–0.7)
HEMATOCRIT: 29.8 % — AB (ref 36.0–46.0)
Hemoglobin: 9.7 g/dL — ABNORMAL LOW (ref 12.0–15.0)
LYMPHS ABS: 1 10*3/uL (ref 0.7–4.0)
LYMPHS PCT: 21 %
MCH: 30.1 pg (ref 26.0–34.0)
MCHC: 32.6 g/dL (ref 30.0–36.0)
MCV: 92.5 fL (ref 78.0–100.0)
MONO ABS: 0.5 10*3/uL (ref 0.1–1.0)
MONOS PCT: 10 %
NEUTROS ABS: 2.9 10*3/uL (ref 1.7–7.7)
NEUTROS PCT: 63 %
PLATELETS: 222 10*3/uL (ref 150–400)
RBC: 3.22 MIL/uL — ABNORMAL LOW (ref 3.87–5.11)
RDW: 15 % (ref 11.5–15.5)
WBC: 4.6 10*3/uL (ref 4.0–10.5)

## 2017-11-07 LAB — COMPREHENSIVE METABOLIC PANEL
ALT: 13 U/L — ABNORMAL LOW (ref 14–54)
ANION GAP: 5 (ref 5–15)
AST: 20 U/L (ref 15–41)
Albumin: 2.4 g/dL — ABNORMAL LOW (ref 3.5–5.0)
Alkaline Phosphatase: 44 U/L (ref 38–126)
BILIRUBIN TOTAL: 0.6 mg/dL (ref 0.3–1.2)
BUN: 17 mg/dL (ref 6–20)
CHLORIDE: 102 mmol/L (ref 101–111)
CO2: 27 mmol/L (ref 22–32)
Calcium: 8 mg/dL — ABNORMAL LOW (ref 8.9–10.3)
Creatinine, Ser: 0.66 mg/dL (ref 0.44–1.00)
Glucose, Bld: 135 mg/dL — ABNORMAL HIGH (ref 65–99)
POTASSIUM: 3.5 mmol/L (ref 3.5–5.1)
Sodium: 134 mmol/L — ABNORMAL LOW (ref 135–145)
TOTAL PROTEIN: 6.1 g/dL — AB (ref 6.5–8.1)

## 2017-11-07 LAB — GLUCOSE, CAPILLARY
GLUCOSE-CAPILLARY: 90 mg/dL (ref 65–99)
GLUCOSE-CAPILLARY: 204 mg/dL — AB (ref 65–99)
Glucose-Capillary: 162 mg/dL — ABNORMAL HIGH (ref 65–99)
Glucose-Capillary: 144 mg/dL — ABNORMAL HIGH (ref 65–99)

## 2017-11-07 LAB — VANCOMYCIN, TROUGH: Vancomycin Tr: 12 ug/mL — ABNORMAL LOW (ref 15–20)

## 2017-11-07 LAB — MAGNESIUM: MAGNESIUM: 1.9 mg/dL (ref 1.7–2.4)

## 2017-11-07 LAB — PHOSPHORUS: Phosphorus: 2.9 mg/dL (ref 2.5–4.6)

## 2017-11-07 NOTE — Progress Notes (Addendum)
PROGRESS NOTE    Emily Wall  YYT:[id number] DOB: 1936/07/06 DOA: 11/02/2017 PCP: Chevis Pretty, FNP  Brief Narrative:  The patient is a 81 yo Female with a PMH of Diabetes Mellitus Type 2, Non-ischemic Cardiomyopathy, Aortic Stenosis, Chronic Venous Stasis, HTN, HLD, Hypothyroidism, Osteoporosis and other comorbids who presented to ED at recommendation from a [place] for a Right Leg Wound Infection. She was placed on Antibiotics for a Leg Wound Infection with Cellulitis and placed on IV Vancomycin, IV Cefepime and IV Metronidazole. Ortho and Woc consulted, Case was discussed with Dr. Oneida Alar and will hold off on formally consulting Vascular Surgery until Orthopedic Evaluation. Dr. Sharol Given evaluated and requested [place] nurse to apply a Profore wrap to both legs to try to decrease swelling to improve arterial flow and have it changed Wednesday. Plan is for debridement of posterior calf necrotic ulcer and Achilles tendon on Right with application of split-thickness skin graft and a wound VAC on Friday 11/16.   Assessment & Plan:   Chronic venous stasis ulcers with secondary infection -In the background of type 2 diabetes -Appreciate [facility] and ortho input -has been on broad coverage with vancomycin cefepime and metronidazole -Cultures negative -ABIs completed but limited study due to pain in inability to compress adequately -Dr.Sheikh, d/w Vascular Surgery Dr. Oneida Alar and [facility] on formal consultation until after evaluated by Orthopedic Surgery Dr. Sharol Given -Appreciate Ortho input -plan for debridement of posterior calf necrotic ulcer and Achilles tendon on Right with application of split-thickness skin graft and a wound VAC.  -Per Ortho - PRAFO on the Right to unload pressure from posterior calf. -Dietician consult appreciated  Anemia of Chronic Disease -stable, continue Iron Supplementation   Essential Hypertension - Controlled  - C/w Carvedilol 25 mg po BID, Furosemide 20 mg po  Daily, and with Lisinopril 40 mg po Daily    Diabetes Mellitus Type 2 - Patient is supposed to be on Metformin and Januvia, but notes that the Januvia prescription is too expensive for her to afford and has not started this. - Hemoglobin A1c and was 8.1 - continue sensitive sliding scale   Combined Systolic and Diastolic CHF / Aortic Stenosis - Last EF noted to be reported 45% with Grade 2 Diastolic Dysfunction and Moderate aortic Stenosis in 05/2017. - C/w Carvedilol 25 mg po BID, Furosemide 20 mg po Daily, and with Lisinopril 40 mg po Daily  -monitor volume status closely, holdLasix tomorrow for surgery  Hyperlipidemia - Continue Atorvastatin 20 mg po Daily  Hypoalbuminemia - Prealbumin was 6.1; Albumin was 2.4 this AM - Nutritionist consulted for Evaluation and Management - Appreciate Recc's and they recommend Prostat BID and Juven Packet BID  Hypomagnesemia -repleted  DVT prophylaxis: Enoxaparin 40 mg sq q24h Code Status: FULL CODE Family Communication: No family present at bedside  Disposition Plan: Remain Inpatient for debridement for on Friday  Consultants:   Orthopedic Surgery  Dietary/Nutritionist   Discussed Case with Vascular Surgery Dr. Oneida Alar    Procedures:  ABI ABI completed:Right ABI not ascertained secondary to pain with compression. However, waveforms and great toe pressure indicates inadequate flow.  Left ABI not ascertained secondary to non compressible vessels.  Posterior tibial signal most likely venous flow.  Great toe pressure is abnormal.       RIGHT    LEFT    PRESSURE WAVEFORM  PRESSURE WAVEFORM  BRACHIAL 111 T BRACHIAL 109 T  DP   DP 300   AT  M AT    PT  M PT  34 DM  PER   PER    GREAT TOE 21 NA GREAT TOE 57 NA    RIGHT LEFT  ABI  Non compressible    Antimicrobials: Anti-infectives (From admission, onward)   Start     Dose/Rate Route Frequency Ordered Stop   11/03/17 2200  vancomycin (VANCOCIN) 1,250 mg in  sodium chloride 0.9 % 250 mL IVPB     1,250 mg 166.7 mL/hr over 90 Minutes Intravenous Every 24 hours 11/03/17 0110     11/03/17 1400  ceFEPIme (MAXIPIME) 1 g in dextrose 5 % 50 mL IVPB     1 g 100 mL/hr over 30 Minutes Intravenous Every 8 hours 11/03/17 0145     11/03/17 0200  metroNIDAZOLE (FLAGYL) IVPB 500 mg     500 mg 100 mL/hr over 60 Minutes Intravenous Every 8 hours 11/03/17 0137     11/03/17 0200  ceFEPIme (MAXIPIME) 2 g in dextrose 5 % 50 mL IVPB     2 g 100 mL/hr over 30 Minutes Intravenous  Once 11/03/17 0146 11/03/17 0503   11/03/17 0000  vancomycin (VANCOCIN) IVPB 1000 mg/200 mL premix     1,000 mg 200 mL/hr over 60 Minutes Intravenous  Once 11/02/17 2355 11/03/17 0216     Subjective: Seen and examined at bedside and was sitting in chair bedside and was doing ok. Complained a little about pain. No Nausea or vomiting. No other concerns or complaints at this time.   Objective: Vitals:   11/06/17 1300 11/06/17 1943 11/07/17 0417 11/07/17 0538  BP: (!) 142/71 132/77 140/79   Pulse: 82 80 77   Resp: 18 17 16    Temp: 98.5 F (36.9 C) 98.7 F (37.1 C) 98 F (36.7 C)   TempSrc: Oral Oral Oral   SpO2: 98% 98% 100%   Weight:    71.6 kg (157 lb 13.6 oz)  Height:       No intake or output data in the 24 hours ending 11/07/17 1416 Filed Weights   11/02/17 2007 11/07/17 0538  Weight: 70.8 kg (156 lb) 71.6 kg (157 lb 13.6 oz)   Examination: Physical Exam:  Gen: Awake, Alert, Oriented X 3, chronically ill, frail African-American female, no distress HEENT: PERRLA, Neck supple, no JVD Lungs: Good air movement bilaterally, CTAB CVS: RRR,No Gallops,Rubs or new Murmurs Abd: soft, Non tender, non distended, BS present Extremities: . Lower extremities with ulcerations, right worse than left, exposed Achilles tendon on the right, purulence and foul smelling drainage noted Skin: as above   Data Reviewed: I have personally reviewed following labs and imaging  studies  CBC: Recent Labs  Lab 11/03/17 0825 11/04/17 0317 11/05/17 0539 11/06/17 0827 11/07/17 0415  WBC 4.8 3.9* 3.7* 5.0 4.6  NEUTROABS 3.1 2.4 2.1 3.4 2.9  HGB 10.2* 9.7* 10.3* 11.0* 9.7*  HCT 31.6* 29.9* 30.7* 33.0* 29.8*  MCV 91.9 91.4 93.3 93.0 92.5  PLT 208 202 211 245 497   Basic Metabolic Panel: Recent Labs  Lab 11/03/17 0825 11/04/17 0317 11/05/17 0539 11/06/17 0827 11/07/17 0415  NA 133* 136 136 131* 134*  K 3.8 3.5 3.7 4.0 3.5  CL 100* 103 102 98* 102  CO2 28 28 28 26 27   GLUCOSE 136* 151* 139* 200* 135*  BUN 6 <5* 13 14 17   CREATININE 0.63 0.62 0.59 0.63 0.66  CALCIUM 8.0* 7.7* 7.9* 8.0* 8.0*  MG 1.5* 1.8 1.7 1.6* 1.9  PHOS 3.0 3.4 2.9 2.5 2.9   GFR: Estimated Creatinine Clearance: 57.5 mL/min (  by C-G formula based on SCr of 0.66 mg/dL). Liver Function Tests: Recent Labs  Lab 11/03/17 0825 11/04/17 0317 11/05/17 0539 11/06/17 0827 11/07/17 0415  AST 14* 11* 14* 23 20  ALT 10* 9* 9* 14 13*  ALKPHOS 48 43 46 50 44  BILITOT 0.5 0.5 0.8 0.7 0.6  PROT 5.6* 5.6* 5.6* 6.7 6.1*  ALBUMIN 2.4* 2.2* 2.4* 2.6* 2.4*   No results for input(s): LIPASE, AMYLASE in the last 168 hours. No results for input(s): AMMONIA in the last 168 hours. Coagulation Profile: No results for input(s): INR, PROTIME in the last 168 hours. Cardiac Enzymes: No results for input(s): CKTOTAL, CKMB, CKMBINDEX, TROPONINI in the last 168 hours. BNP (last 3 results) No results for input(s): PROBNP in the last 8760 hours. HbA1C: No results for input(s): HGBA1C in the last 72 hours. CBG: Recent Labs  Lab 11/06/17 1121 11/06/17 1648 11/06/17 2029 11/07/17 0908 11/07/17 1225  GLUCAP 115* 155* 125* 162* 90   Lipid Profile: Recent Labs    11/05/17 0539  CHOL 70  HDL 33*  LDLCALC 30  TRIG 35  CHOLHDL 2.1   Thyroid Function Tests: No results for input(s): TSH, T4TOTAL, FREET4, T3FREE, THYROIDAB in the last 72 hours. Anemia Panel: No results for input(s): VITAMINB12,  FOLATE, FERRITIN, TIBC, IRON, RETICCTPCT in the last 72 hours. Sepsis Labs: No results for input(s): PROCALCITON, LATICACIDVEN in the last 168 hours.  Recent Results (from the past 240 hour(s))  Blood Cultures x 2 sites     Status: None (Preliminary result)   Collection Time: 11/03/17  1:10 AM  Result Value Ref Range Status   Specimen Description BLOOD RIGHT ANTECUBITAL  Final   Special Requests   Final    BOTTLES DRAWN AEROBIC AND ANAEROBIC Blood Culture adequate volume   Culture NO GROWTH 4 DAYS  Final   Report Status PENDING  Incomplete  Blood Cultures x 2 sites     Status: None (Preliminary result)   Collection Time: 11/03/17  1:15 AM  Result Value Ref Range Status   Specimen Description BLOOD LEFT ANTECUBITAL  Final   Special Requests   Final    BOTTLES DRAWN AEROBIC AND ANAEROBIC Blood Culture results may not be optimal due to an excessive volume of blood received in culture bottles   Culture NO GROWTH 4 DAYS  Final   Report Status PENDING  Incomplete     Radiology Studies: No results found. Scheduled Meds: . aspirin  325 mg Oral Daily  . atorvastatin  20 mg Oral Daily  . carvedilol  25 mg Oral BID WC  . enoxaparin (LOVENOX) injection  40 mg Subcutaneous Q24H  . feeding supplement (PRO-STAT SUGAR FREE 64)  30 mL Oral BID  . ferrous sulfate  325 mg Oral Q breakfast  . furosemide  20 mg Oral Daily  . insulin aspart  0-5 Units Subcutaneous QHS  . insulin aspart  0-9 Units Subcutaneous TID WC  . lisinopril  40 mg Oral Daily  . nutrition supplement (JUVEN)  1 packet Oral BID BM   Continuous Infusions: . ceFEPime (MAXIPIME) IV Stopped (11/07/17 0551)  . metronidazole Stopped (11/07/17 1041)  . vancomycin Stopped (11/07/17 0005)    LOS: 4 days   Domenic Polite, MD [facility] Page via [email or web address], password [email or web address]  If 7PM-7AM, please contact night-coverage [email or web address] Password [email or web address] 11/07/2017, 2:16 PM

## 2017-11-07 NOTE — Plan of Care (Signed)
  Pain Managment: General experience of comfort will improve 11/07/2017 0340 - Progressing by Janee Morn, RN

## 2017-11-07 NOTE — Care Management Important Message (Signed)
Important Message  Patient Details  Name: Emily Wall MRN: [id number] Date of Birth: 06-26-1936   Medicare Important Message Given:  Yes    Booker Bhatnagar 11/07/2017, 12:45 PM

## 2017-11-07 NOTE — Progress Notes (Signed)
Pharmacy Antibiotic Note  Emily Wall is a 81 y.o. female admitted on 11/02/2017 with right leg wounds.  Pharmacy was consulted for Vancomycin/Cefepime dosing.  Continues on abx for chronic R leg wound infection. Plan on debride calf ulcer on Fri. Afebrile, WBC wnl  Vancomycin trough = 12 mcg/ml on vancomycin 1250 mg IV q24h  Goal Vanc trough = 10-15 mcg/ml  Plan: Continue vancomycin 1,250 mg IV q24h Continue cefepime 1g IV q8h Continue Flagyl 500mg  IV Q8h per MD Monitor clinical picture, renal function, VT prn F/U C&S, abx deescalation / LOT   Height: 5\' 5"  (165.1 cm) Weight: 157 lb 13.6 oz (71.6 kg) IBW/kg (Calculated) : 57  Temp (24hrs), Avg:98.3 F (36.8 C), Min:98 F (36.7 C), Max:98.4 F (36.9 C)  Recent Labs  Lab 11/03/17 0825 11/04/17 0317 11/05/17 0539 11/06/17 0827 11/07/17 0415 11/07/17 2126  WBC 4.8 3.9* 3.7* 5.0 4.6  --   CREATININE 0.63 0.62 0.59 0.63 0.66  --   VANCOTROUGH  --   --   --   --   --  12*    Estimated Creatinine Clearance: 57.5 mL/min (by C-G formula based on SCr of 0.66 mg/dL).    No Known Allergies  Nicole Cella, RPh Clinical Pharmacist 11/07/2017 10:44 PM

## 2017-11-07 NOTE — Plan of Care (Signed)
  Activity: Risk for activity intolerance will decrease 11/07/2017 1002 - Progressing by Williams Che, RN   Nutrition: Adequate nutrition will be maintained 11/07/2017 1002 - Progressing by Williams Che, RN   Pain Managment: General experience of comfort will improve 11/07/2017 1002 - Progressing by Williams Che, RN   Safety: Ability to remain free from injury will improve 11/07/2017 1002 - Progressing by Williams Che, RN   Skin Integrity: Risk for impaired skin integrity will decrease 11/07/2017 1002 - Progressing by Williams Che, RN

## 2017-11-08 ENCOUNTER — Other Ambulatory Visit (INDEPENDENT_AMBULATORY_CARE_PROVIDER_SITE_OTHER): Payer: Self-pay | Admitting: Family

## 2017-11-08 ENCOUNTER — Ambulatory Visit: Payer: Medicare HMO | Admitting: Cardiology

## 2017-11-08 ENCOUNTER — Ambulatory Visit (HOSPITAL_COMMUNITY): Payer: Medicare HMO | Admitting: Physical Therapy

## 2017-11-08 LAB — CBC
HEMATOCRIT: 30.3 % — AB (ref 36.0–46.0)
HEMOGLOBIN: 9.6 g/dL — AB (ref 12.0–15.0)
MCH: 29.4 pg (ref 26.0–34.0)
MCHC: 31.7 g/dL (ref 30.0–36.0)
MCV: 92.9 fL (ref 78.0–100.0)
Platelets: 214 10*3/uL (ref 150–400)
RBC: 3.26 MIL/uL — ABNORMAL LOW (ref 3.87–5.11)
RDW: 14.9 % (ref 11.5–15.5)
WBC: 3.3 10*3/uL — ABNORMAL LOW (ref 4.0–10.5)

## 2017-11-08 LAB — BASIC METABOLIC PANEL
ANION GAP: 4 — AB (ref 5–15)
BUN: 16 mg/dL (ref 6–20)
CHLORIDE: 103 mmol/L (ref 101–111)
CO2: 28 mmol/L (ref 22–32)
Calcium: 8 mg/dL — ABNORMAL LOW (ref 8.9–10.3)
Creatinine, Ser: 0.58 mg/dL (ref 0.44–1.00)
GFR calc Af Amer: >60 mL/min (ref >60)
GLUCOSE: 209 mg/dL — AB (ref 65–99)
POTASSIUM: 3.6 mmol/L (ref 3.5–5.1)
Sodium: 135 mmol/L (ref 135–145)

## 2017-11-08 LAB — CULTURE, BLOOD (ROUTINE X 2)
Culture: NO GROWTH
Culture: NO GROWTH
SPECIAL REQUESTS: ADEQUATE

## 2017-11-08 LAB — GLUCOSE, CAPILLARY
GLUCOSE-CAPILLARY: 131 mg/dL — AB (ref 65–99)
GLUCOSE-CAPILLARY: 172 mg/dL — AB (ref 65–99)
GLUCOSE-CAPILLARY: 196 mg/dL — AB (ref 65–99)
Glucose-Capillary: 185 mg/dL — ABNORMAL HIGH (ref 65–99)

## 2017-11-08 MED ORDER — LISINOPRIL 10 MG PO TABS
20.0000 mg | ORAL_TABLET | Freq: Every day | ORAL | Status: DC
  Administered 2017-11-08 – 2017-11-14 (×7): 20 mg via ORAL
  Filled 2017-11-08 (×3): qty 1
  Filled 2017-11-08: qty 2
  Filled 2017-11-08 (×2): qty 1

## 2017-11-08 MED ORDER — CEFAZOLIN SODIUM-DEXTROSE 2-4 GM/100ML-% IV SOLN
2.0000 g | INTRAVENOUS | Status: AC
  Administered 2017-11-09: 2 g via INTRAVENOUS
  Filled 2017-11-08: qty 100

## 2017-11-08 NOTE — Progress Notes (Signed)
PROGRESS NOTE    Emily Wall  QMV:[id number] DOB: [birth year] DOA: 11/02/2017 PCP: Chevis Pretty, FNP  Brief Narrative:  The patient is a 81 yo Female with a PMH of Diabetes Mellitus Type 2, Non-ischemic Cardiomyopathy, Aortic Stenosis, Chronic Venous Stasis, HTN, HLD, Hypothyroidism, Osteoporosis and other comorbids who presented to ED at recommendation from a [place] for a Right Leg Wound Infection. She was placed on Antibiotics for a Leg Wound Infection with Cellulitis and placed on IV Vancomycin, IV Cefepime and IV Metronidazole. Ortho and Woc consulted, Case was discussed with Dr. Oneida Alar and will hold off on formally consulting Vascular Surgery until Orthopedic Evaluation. Dr. Sharol Given evaluated and requested [place] nurse to apply a Profore wrap to both legs to try to decrease swelling to improve arterial flow and have it changed Wednesday. Plan is for debridement of posterior calf necrotic ulcer and Achilles tendon on Right with application of split-thickness skin graft and a wound VAC on Friday 11/16.   Assessment & Plan:   Chronic venous stasis ulcers with secondary infection and necrosis and DM -right much worse than left -Appreciate [facility] and ortho input -has been on broad coverage with vancomycin cefepime and metronidazole -Cultures negative, de-escalate Abx after surgery tomorrow to Doxy alone, +/- Augmentin -ABIs completed but limited study due to pain in inability to compress adequately -Dr.Sheikh, d/w Vascular Surgery Dr. Oneida Alar and he heldoff on formal consultation until after evaluated by Orthopedic Surgery Dr. Sharol Given -Appreciate Ortho input -plan for debridement of posterior calf necrotic ulcer and Achilles tendon on Right with application of split-thickness skin graft and a wound VAC.  -Per Ortho - PRAFO on the Right to unload pressure from posterior calf. -Malnutrition, Dietician consult appreciated-supplements as tolerated  Anemia of Chronic Disease -stable,  continue Iron Supplementation   Essential Hypertension - Controlled  - C/w Carvedilol 25 mg po BID, Furosemide 20 mg po Daily, and with Lisinopril    Diabetes Mellitus Type 2 - Patient is supposed to be on Metformin and Januvia, but notes that the Januvia prescription is too expensive for her to afford and has not started this. - Hemoglobin A1c and was 8.1 - continue sensitive sliding scale   Combined Systolic and Diastolic CHF / Aortic Stenosis - Last EF noted to be reported 45% with Grade 2 Diastolic Dysfunction and Moderate aortic Stenosis in 05/2017. - C/w Carvedilol 25 mg po BID, hold lasix today and cut down Lisinopril  -monitor volume status closely  Hyperlipidemia - Continue Atorvastatin 20 mg po Daily  Hypoalbuminemia - Prealbumin was 6.1; Albumin was 2.4 this AM - Nutritionist consulted for Evaluation and Management - Appreciate Re[facility] and they recommend Prostat BID and Juven Packet BID  Hypomagnesemia -repleted  DVT prophylaxis: Enoxaparin 40 mg sq q24h Code Status: FULL CODE Family Communication: No family present at bedside  Disposition Plan: Remain Inpatient for debridement for on Friday  Consultants:   Orthopedic Surgery  Dietary/Nutritionist   Discussed Case with Vascular Surgery Dr. Oneida Alar    Procedures:  ABI ABI completed:Right ABI not ascertained secondary to pain with compression. However, waveforms and great toe pressure indicates inadequate flow.  Left ABI not ascertained secondary to non compressible vessels.  Posterior tibial signal most likely venous flow.  Great toe pressure is abnormal.       RIGHT    LEFT    PRESSURE WAVEFORM  PRESSURE WAVEFORM  BRACHIAL 111 T BRACHIAL 109 T  DP   DP 300   AT  M AT    PT  M PT 34 DM  PER   PER    GREAT TOE 21 NA GREAT TOE 57 NA    RIGHT LEFT  ABI  Non compressible    Antimicrobials: Anti-infectives (From admission, onward)   Start     Dose/Rate Route Frequency Ordered  Stop   11/09/17 1100  ceFAZolin (ANCEF) IVPB 2g/100 mL premix     2 g 200 mL/hr over 30 Minutes Intravenous To ShortStay Surgical 11/08/17 1242 11/10/17 1100   11/03/17 2200  vancomycin (VANCOCIN) 1,250 mg in sodium chloride 0.9 % 250 mL IVPB     1,250 mg 166.7 mL/hr over 90 Minutes Intravenous Every 24 hours 11/03/17 0110     11/03/17 1400  ceFEPIme (MAXIPIME) 1 g in dextrose 5 % 50 mL IVPB     1 g 100 mL/hr over 30 Minutes Intravenous Every 8 hours 11/03/17 0145     11/03/17 0200  metroNIDAZOLE (FLAGYL) IVPB 500 mg     500 mg 100 mL/hr over 60 Minutes Intravenous Every 8 hours 11/03/17 0137     11/03/17 0200  ceFEPIme (MAXIPIME) 2 g in dextrose 5 % 50 mL IVPB     2 g 100 mL/hr over 30 Minutes Intravenous  Once 11/03/17 0146 11/03/17 0503   11/03/17 0000  vancomycin (VANCOCIN) IVPB 1000 mg/200 mL premix     1,000 mg 200 mL/hr over 60 Minutes Intravenous  Once 11/02/17 2355 11/03/17 0216     Subjective: -no complaints, feeling well, dressing not changed yet  Objective: Vitals:   11/07/17 2004 11/08/17 0338 11/08/17 0500 11/08/17 0908  BP: 115/74 (!) 120/54  122/60  Pulse: 74 (!) 58    Resp: 15 16    Temp: 98.4 F (36.9 C) 99.1 F (37.3 C)    TempSrc: Oral Oral    SpO2: 100% 100%    Weight:   72.4 kg (159 lb 9.8 oz)   Height:        Intake/Output Summary (Last 24 hours) at 11/08/2017 1334 Last data filed at 11/08/2017 0338 Gross per 24 hour  Intake 360 ml  Output 1 ml  Net 359 ml   Filed Weights   11/02/17 2007 11/07/17 0538 11/08/17 0500  Weight: 70.8 kg (156 lb) 71.6 kg (157 lb 13.6 oz) 72.4 kg (159 lb 9.8 oz)   Examination: Physical Exam:  Gen: Awake, Alert, Oriented X 3,  HEENT: PERRLA, Neck supple, no JVD Lungs: Good air movement bilaterally, CTAB CVS: RRR,No Gallops,Rubs or new Murmurs Abd: soft, Non tender, non distended, BS present Extremities:. Lower extremities with ulcerations with dressing/ACe wrap-didn't unwrap dressing today  Skin: as  above Psychiatry: appropriate mood and affect   Data Reviewed: I have personally reviewed following labs and imaging studies  CBC: Recent Labs  Lab 11/03/17 0825 11/04/17 0317 11/05/17 0539 11/06/17 0827 11/07/17 0415 11/08/17 0708  WBC 4.8 3.9* 3.7* 5.0 4.6 3.3*  NEUTROABS 3.1 2.4 2.1 3.4 2.9  --   HGB 10.2* 9.7* 10.3* 11.0* 9.7* 9.6*  HCT 31.6* 29.9* 30.7* 33.0* 29.8* 30.3*  MCV 91.9 91.4 93.3 93.0 92.5 92.9  PLT 208 202 211 245 222 259   Basic Metabolic Panel: Recent Labs  Lab 11/03/17 0825 11/04/17 0317 11/05/17 0539 11/06/17 0827 11/07/17 0415 11/08/17 0708  NA 133* 136 136 131* 134* 135  K 3.8 3.5 3.7 4.0 3.5 3.6  CL 100* 103 102 98* 102 103  CO2 28 28 28 26 27 28   GLUCOSE 136* 151* 139* 200* 135* 209*  BUN 6 <  5* 13 14 17 16   CREATININE 0.63 0.62 0.59 0.63 0.66 0.58  CALCIUM 8.0* 7.7* 7.9* 8.0* 8.0* 8.0*  MG 1.5* 1.8 1.7 1.6* 1.9  --   PHOS 3.0 3.4 2.9 2.5 2.9  --    GFR: Estimated Creatinine Clearance: 57.8 mL/min (by C-G formula based on SCr of 0.58 mg/dL). Liver Function Tests: Recent Labs  Lab 11/03/17 0825 11/04/17 0317 11/05/17 0539 11/06/17 0827 11/07/17 0415  AST 14* 11* 14* 23 20  ALT 10* 9* 9* 14 13*  ALKPHOS 48 43 46 50 44  BILITOT 0.5 0.5 0.8 0.7 0.6  PROT 5.6* 5.6* 5.6* 6.7 6.1*  ALBUMIN 2.4* 2.2* 2.4* 2.6* 2.4*   No results for input(s): LIPASE, AMYLASE in the last 168 hours. No results for input(s): AMMONIA in the last 168 hours. Coagulation Profile: No results for input(s): INR, PROTIME in the last 168 hours. Cardiac Enzymes: No results for input(s): CKTOTAL, CKMB, CKMBINDEX, TROPONINI in the last 168 hours. BNP (last 3 results) No results for input(s): PROBNP in the last 8760 hours. HbA1C: No results for input(s): HGBA1C in the last 72 hours. CBG: Recent Labs  Lab 11/07/17 1225 11/07/17 1637 11/07/17 2221 11/08/17 0632 11/08/17 1134  GLUCAP 90 144* 204* 185* 131*   Lipid Profile: No results for input(s): CHOL, HDL,  LDLCALC, TRIG, CHOLHDL, LDLDIRECT in the last 72 hours. Thyroid Function Tests: No results for input(s): TSH, T4TOTAL, FREET4, T3FREE, THYROIDAB in the last 72 hours. Anemia Panel: No results for input(s): VITAMINB12, FOLATE, FERRITIN, TIBC, IRON, RETICCTPCT in the last 72 hours. Sepsis Labs: No results for input(s): PROCALCITON, LATICACIDVEN in the last 168 hours.  Recent Results (from the past 240 hour(s))  Blood Cultures x 2 sites     Status: None   Collection Time: 11/03/17  1:10 AM  Result Value Ref Range Status   Specimen Description BLOOD RIGHT ANTECUBITAL  Final   Special Requests   Final    BOTTLES DRAWN AEROBIC AND ANAEROBIC Blood Culture adequate volume   Culture NO GROWTH 5 DAYS  Final   Report Status 11/08/2017 FINAL  Final  Blood Cultures x 2 sites     Status: None   Collection Time: 11/03/17  1:15 AM  Result Value Ref Range Status   Specimen Description BLOOD LEFT ANTECUBITAL  Final   Special Requests   Final    BOTTLES DRAWN AEROBIC AND ANAEROBIC Blood Culture results may not be optimal due to an excessive volume of blood received in culture bottles   Culture NO GROWTH 5 DAYS  Final   Report Status 11/08/2017 FINAL  Final     Radiology Studies: No results found. Scheduled Meds: . aspirin  325 mg Oral Daily  . atorvastatin  20 mg Oral Daily  . carvedilol  25 mg Oral BID WC  . enoxaparin (LOVENOX) injection  40 mg Subcutaneous Q24H  . feeding supplement (PRO-STAT SUGAR FREE 64)  30 mL Oral BID  . ferrous sulfate  325 mg Oral Q breakfast  . insulin aspart  0-5 Units Subcutaneous QHS  . insulin aspart  0-9 Units Subcutaneous TID WC  . lisinopril  20 mg Oral Daily  . nutrition supplement (JUVEN)  1 packet Oral BID BM   Continuous Infusions: . [START ON 11/09/2017]  ceFAZolin (ANCEF) IV    . ceFEPime (MAXIPIME) IV Stopped (11/08/17 0615)  . metronidazole Stopped (11/08/17 0957)  . vancomycin Stopped (11/08/17 0012)    LOS: 5 days   Domenic Polite, MD [facility] Page  via [email or web address], password [email or web address]  If 7PM-7AM, please contact night-coverage [email or web address] Password [email or web address] 11/08/2017, 1:34 PM

## 2017-11-09 ENCOUNTER — Inpatient Hospital Stay (HOSPITAL_COMMUNITY): Payer: Medicare HMO | Admitting: Certified Registered Nurse Anesthetist

## 2017-11-09 ENCOUNTER — Encounter (HOSPITAL_COMMUNITY): Payer: Self-pay | Admitting: Certified Registered Nurse Anesthetist

## 2017-11-09 ENCOUNTER — Encounter (HOSPITAL_COMMUNITY)
Admission: EM | Disposition: A | Discharge status: Home Health | Payer: Self-pay | Source: Home / Self Care | Attending: Internal Medicine

## 2017-11-09 HISTORY — PX: SKIN SPLIT GRAFT: SHX444

## 2017-11-09 LAB — GLUCOSE, CAPILLARY
Glucose-Capillary: 137 mg/dL — ABNORMAL HIGH (ref 65–99)
Glucose-Capillary: 116 mg/dL — ABNORMAL HIGH (ref 65–99)
Glucose-Capillary: 145 mg/dL — ABNORMAL HIGH (ref 65–99)
Glucose-Capillary: 188 mg/dL — ABNORMAL HIGH (ref 65–99)
Glucose-Capillary: 169 mg/dL — ABNORMAL HIGH (ref 65–99)
Glucose-Capillary: 188 mg/dL — ABNORMAL HIGH (ref 65–99)

## 2017-11-09 LAB — MRSA PCR SCREENING: MRSA by PCR: POSITIVE — AB

## 2017-11-09 SURGERY — APPLICATION, GRAFT, SKIN, SPLIT-THICKNESS
Anesthesia: General | Site: Leg Lower | Laterality: Right

## 2017-11-09 MED ORDER — DOCUSATE SODIUM 100 MG PO CAPS
100.0000 mg | ORAL_CAPSULE | Freq: Two times a day (BID) | ORAL | Status: DC
  Administered 2017-11-09 – 2017-11-14 (×10): 100 mg via ORAL
  Filled 2017-11-09 (×11): qty 1

## 2017-11-09 MED ORDER — TRANEXAMIC ACID 1000 MG/10ML IV SOLN
2000.0000 mg | INTRAVENOUS | Status: AC
  Administered 2017-11-09: 2000 mg via TOPICAL
  Filled 2017-11-09: qty 20

## 2017-11-09 MED ORDER — PROPOFOL 10 MG/ML IV BOLUS
INTRAVENOUS | Status: DC | PRN
  Administered 2017-11-09: 150 mg via INTRAVENOUS

## 2017-11-09 MED ORDER — ONDANSETRON HCL 4 MG/2ML IJ SOLN
4.0000 mg | Freq: Four times a day (QID) | INTRAMUSCULAR | Status: DC | PRN
  Administered 2017-11-09: 4 mg via INTRAVENOUS

## 2017-11-09 MED ORDER — ONDANSETRON HCL 4 MG/2ML IJ SOLN
INTRAMUSCULAR | Status: DC | PRN
  Administered 2017-11-09: 4 mg via INTRAVENOUS

## 2017-11-09 MED ORDER — ACETAMINOPHEN 10 MG/ML IV SOLN
INTRAVENOUS | Status: AC
  Administered 2017-11-09: 1000 mg via INTRAVENOUS
  Filled 2017-11-09: qty 100

## 2017-11-09 MED ORDER — PROPOFOL 10 MG/ML IV BOLUS
INTRAVENOUS | Status: AC
  Filled 2017-11-09: qty 20

## 2017-11-09 MED ORDER — OXYCODONE HCL 5 MG PO TABS
5.0000 mg | ORAL_TABLET | Freq: Once | ORAL | Status: AC | PRN
  Administered 2017-11-09: 5 mg via ORAL

## 2017-11-09 MED ORDER — LACTATED RINGERS IV SOLN
INTRAVENOUS | Status: DC
  Administered 2017-11-09: 10:00:00 via INTRAVENOUS

## 2017-11-09 MED ORDER — ACETAMINOPHEN 10 MG/ML IV SOLN
1000.0000 mg | Freq: Once | INTRAVENOUS | Status: AC
  Administered 2017-11-09: 1000 mg via INTRAVENOUS

## 2017-11-09 MED ORDER — OXYCODONE HCL 5 MG PO TABS: 10.0000 mg | ORAL_TABLET | ORAL | Status: DC | PRN

## 2017-11-09 MED ORDER — PROMETHAZINE HCL 25 MG/ML IJ SOLN: 6.2500 mg | INTRAMUSCULAR | Status: DC | PRN

## 2017-11-09 MED ORDER — EPHEDRINE SULFATE 50 MG/ML IJ SOLN
INTRAMUSCULAR | Status: DC | PRN
  Administered 2017-11-09: 20 mg via INTRAVENOUS

## 2017-11-09 MED ORDER — CHLORHEXIDINE GLUCONATE CLOTH 2 % EX PADS
6.0000 | MEDICATED_PAD | Freq: Every day | CUTANEOUS | Status: AC
  Administered 2017-11-10 – 2017-11-14 (×5): 6 via TOPICAL

## 2017-11-09 MED ORDER — METHOCARBAMOL 1000 MG/10ML IJ SOLN
500.0000 mg | Freq: Four times a day (QID) | INTRAVENOUS | Status: DC | PRN
  Filled 2017-11-09: qty 5

## 2017-11-09 MED ORDER — OXYCODONE HCL 5 MG PO TABS
ORAL_TABLET | ORAL | Status: AC
  Filled 2017-11-09: qty 1

## 2017-11-09 MED ORDER — MUPIROCIN 2 % EX OINT
1.0000 "application " | TOPICAL_OINTMENT | Freq: Two times a day (BID) | CUTANEOUS | Status: AC
  Administered 2017-11-09 – 2017-11-13 (×9): 1 via NASAL
  Filled 2017-11-09 (×3): qty 22

## 2017-11-09 MED ORDER — CHLORHEXIDINE GLUCONATE 4 % EX LIQD
60.0000 mL | Freq: Once | CUTANEOUS | Status: AC
  Administered 2017-11-09: 4 via TOPICAL
  Filled 2017-11-09: qty 60

## 2017-11-09 MED ORDER — HYDROMORPHONE HCL 1 MG/ML IJ SOLN
0.2500 mg | INTRAMUSCULAR | Status: DC | PRN
  Administered 2017-11-09 (×4): 0.5 mg via INTRAVENOUS

## 2017-11-09 MED ORDER — LIDOCAINE 2% (20 MG/ML) 5 ML SYRINGE
INTRAMUSCULAR | Status: DC | PRN
  Administered 2017-11-09: 60 mg via INTRAVENOUS

## 2017-11-09 MED ORDER — ONDANSETRON HCL 4 MG PO TABS: 4.0000 mg | ORAL_TABLET | Freq: Four times a day (QID) | ORAL | Status: DC | PRN

## 2017-11-09 MED ORDER — 0.9 % SODIUM CHLORIDE (POUR BTL) OPTIME
TOPICAL | Status: DC | PRN
  Administered 2017-11-09: 1000 mL

## 2017-11-09 MED ORDER — HYDROMORPHONE HCL 1 MG/ML IJ SOLN
INTRAMUSCULAR | Status: AC
  Administered 2017-11-09: 0.5 mg via INTRAVENOUS
  Filled 2017-11-09: qty 1

## 2017-11-09 MED ORDER — FENTANYL CITRATE (PF) 100 MCG/2ML IJ SOLN
INTRAMUSCULAR | Status: DC | PRN
  Administered 2017-11-09 (×5): 50 ug via INTRAVENOUS

## 2017-11-09 MED ORDER — BISACODYL 10 MG RE SUPP: 10.0000 mg | Freq: Every day | RECTAL | Status: DC | PRN

## 2017-11-09 MED ORDER — FENTANYL CITRATE (PF) 250 MCG/5ML IJ SOLN
INTRAMUSCULAR | Status: AC
  Filled 2017-11-09: qty 5

## 2017-11-09 MED ORDER — METHOCARBAMOL 500 MG PO TABS: 500.0000 mg | ORAL_TABLET | Freq: Four times a day (QID) | ORAL | Status: DC | PRN

## 2017-11-09 MED ORDER — PHENYLEPHRINE HCL 10 MG/ML IJ SOLN
INTRAMUSCULAR | Status: DC | PRN
  Administered 2017-11-09 (×3): 120 ug via INTRAVENOUS

## 2017-11-09 MED ORDER — ACETAMINOPHEN 325 MG PO TABS
650.0000 mg | ORAL_TABLET | ORAL | Status: DC | PRN
  Administered 2017-11-11: 650 mg via ORAL
  Filled 2017-11-09: qty 2

## 2017-11-09 MED ORDER — METOCLOPRAMIDE HCL 5 MG PO TABS: 5.0000 mg | ORAL_TABLET | Freq: Three times a day (TID) | ORAL | Status: DC | PRN

## 2017-11-09 MED ORDER — OXYCODONE HCL 5 MG/5ML PO SOLN: 5.0000 mg | Freq: Once | ORAL | Status: AC | PRN

## 2017-11-09 MED ORDER — ACETAMINOPHEN 650 MG RE SUPP: 650.0000 mg | RECTAL | Status: DC | PRN

## 2017-11-09 MED ORDER — METOCLOPRAMIDE HCL 5 MG/ML IJ SOLN: 5.0000 mg | Freq: Three times a day (TID) | INTRAMUSCULAR | Status: DC | PRN

## 2017-11-09 MED ORDER — SODIUM CHLORIDE 0.9 % IV SOLN: INTRAVENOUS | Status: DC

## 2017-11-09 MED ORDER — MAGNESIUM CITRATE PO SOLN: 1.0000 | Freq: Once | ORAL | Status: DC | PRN

## 2017-11-09 MED ORDER — POLYETHYLENE GLYCOL 3350 17 G PO PACK: 17.0000 g | PACK | Freq: Every day | ORAL | Status: DC | PRN

## 2017-11-09 MED ORDER — DEXAMETHASONE SODIUM PHOSPHATE 10 MG/ML IJ SOLN
INTRAMUSCULAR | Status: DC | PRN
  Administered 2017-11-09: 5 mg via INTRAVENOUS

## 2017-11-09 SURGICAL SUPPLY — 48 items
ALLOGRAFT SKIN MESHD 125 SQ CM (Tissue) ×2 IMPLANT
BNDG CMPR 9X4 STRL LF SNTH (GAUZE/BANDAGES/DRESSINGS)
BNDG COHESIVE 6X5 TAN STRL LF (GAUZE/BANDAGES/DRESSINGS) IMPLANT
BNDG ESMARK 4X9 LF (GAUZE/BANDAGES/DRESSINGS) IMPLANT
BNDG GAUZE STRTCH 6 (GAUZE/BANDAGES/DRESSINGS) IMPLANT
CANISTER WOUND CARE 500ML ATS (WOUND CARE) ×3 IMPLANT
COVER SURGICAL LIGHT HANDLE (MISCELLANEOUS) ×6 IMPLANT
CUFF TOURNIQUET SINGLE 18IN (TOURNIQUET CUFF) IMPLANT
CUFF TOURNIQUET SINGLE 24IN (TOURNIQUET CUFF) IMPLANT
DERMACARRIERS GRAFT 1 TO 1.5 (DISPOSABLE)
DRAPE U-SHAPE 47X51 STRL (DRAPES) ×3 IMPLANT
DRESSING VERAFLO CLEANSE CC (GAUZE/BANDAGES/DRESSINGS) ×1 IMPLANT
DRSG ADAPTIC 3X8 NADH LF (GAUZE/BANDAGES/DRESSINGS) IMPLANT
DRSG MEPITEL 4X7.2 (GAUZE/BANDAGES/DRESSINGS) IMPLANT
DRSG VERAFLO CLEANSE CC (GAUZE/BANDAGES/DRESSINGS) ×3
DURAPREP 26ML APPLICATOR (WOUND CARE) ×3 IMPLANT
ELECT REM PT RETURN 9FT ADLT (ELECTROSURGICAL) ×3
ELECTRODE REM PT RTRN 9FT ADLT (ELECTROSURGICAL) ×1 IMPLANT
GAUZE SPONGE 4X4 12PLY STRL (GAUZE/BANDAGES/DRESSINGS) IMPLANT
GLOVE BIOGEL PI IND STRL 9 (GLOVE) ×1 IMPLANT
GLOVE BIOGEL PI INDICATOR 9 (GLOVE) ×2
GLOVE SURG ORTHO 9.0 STRL STRW (GLOVE) ×3 IMPLANT
GOWN STRL REUS W/ TWL XL LVL3 (GOWN DISPOSABLE) ×2 IMPLANT
GOWN STRL REUS W/TWL XL LVL3 (GOWN DISPOSABLE) ×4
GRAFT DERMACARRIERS 1 TO 1.5 (DISPOSABLE) IMPLANT
HANDPIECE INTERPULSE COAX TIP (DISPOSABLE) ×3
KIT BASIN OR (CUSTOM PROCEDURE TRAY) ×3 IMPLANT
KIT ROOM TURNOVER OR (KITS) ×3 IMPLANT
MANIFOLD NEPTUNE II (INSTRUMENTS) ×3 IMPLANT
NEEDLE HYPO 25GX1X1/2 BEV (NEEDLE) IMPLANT
NS IRRIG 1000ML POUR BTL (IV SOLUTION) ×3 IMPLANT
PACK ORTHO EXTREMITY (CUSTOM PROCEDURE TRAY) ×3 IMPLANT
PAD ARMBOARD 7.5X6 YLW CONV (MISCELLANEOUS) ×6 IMPLANT
PAD CAST 4YDX4 CTTN HI CHSV (CAST SUPPLIES) IMPLANT
PAD NEG PRESSURE SENSATRAC (MISCELLANEOUS) ×3 IMPLANT
PADDING CAST COTTON 4X4 STRL (CAST SUPPLIES)
SET HNDPC FAN SPRY TIP SCT (DISPOSABLE) ×1 IMPLANT
SKIN MESHED 125 SQ CM (Tissue) ×6 IMPLANT
STAPLER VISISTAT 35W (STAPLE) ×6 IMPLANT
SUCTION FRAZIER HANDLE 10FR (MISCELLANEOUS)
SUCTION TUBE FRAZIER 10FR DISP (MISCELLANEOUS) IMPLANT
SUT ETHILON 4 0 PS 2 18 (SUTURE) ×3 IMPLANT
SYR CONTROL 10ML LL (SYRINGE) IMPLANT
TOWEL OR 17X24 6PK STRL BLUE (TOWEL DISPOSABLE) ×3 IMPLANT
TOWEL OR 17X26 10 PK STRL BLUE (TOWEL DISPOSABLE) ×3 IMPLANT
TUBE CONNECTING 12'X1/4 (SUCTIONS)
TUBE CONNECTING 12X1/4 (SUCTIONS) IMPLANT
WATER STERILE IRR 1000ML POUR (IV SOLUTION) ×3 IMPLANT

## 2017-11-09 NOTE — Anesthesia Procedure Notes (Signed)
Procedure Name: LMA Insertion Date/Time: 11/09/2017 10:33 AM Performed by: White, Amedeo Plenty, CRNA Pre-anesthesia Checklist: Patient identified, Emergency Drugs available, Suction available and Patient being monitored Patient Re-evaluated:Patient Re-evaluated prior to induction Oxygen Delivery Method: Circle System Utilized Preoxygenation: Pre-oxygenation with 100% oxygen Induction Type: IV induction LMA: LMA inserted LMA Size: 4.0 Number of attempts: 1 Airway Equipment and Method: Bite block Placement Confirmation: positive ETCO2 Tube secured with: Tape Dental Injury: Teeth and Oropharynx as per pre-operative assessment

## 2017-11-09 NOTE — Op Note (Signed)
11/09/2017  11:05 AM  PATIENT:  Emily Wall    PRE-OPERATIVE DIAGNOSIS:  Wound Right Leg  POST-OPERATIVE DIAGNOSIS:  Same  PROCEDURE:  RIGHT LEG DEBRIDE ACHILLES, SPLIT THICKNESS SKIN GRAFT, VAC  SURGEON:  Newt Minion, MD  PHYSICIAN ASSISTANT:None ANESTHESIA:   General  PREOPERATIVE INDICATIONS:  Emily Wall is a  81 y.o. female with a diagnosis of Wound Right Leg who failed conservative measures and elected for surgical management.    The risks benefits and alternatives were discussed with the patient preoperatively including but not limited to the risks of infection, bleeding, nerve injury, cardiopulmonary complications, the need for revision surgery, among others, and the patient was willing to proceed.  OPERATIVE IMPLANTS: Split-thickness skin graft 250 cm  OPERATIVE FINDINGS: Good granulation tissue no deep abscess.   OPERATIVE PROCEDURE:  Patient was brought to the operating room and underwent a general anesthetic.  After adequate levels of anesthesia were obtained patient's right lower extremity was prepped using DuraPrep draped into a sterile field a timeout was called.  A 21 blade knife was used to debride the skin and soft tissue Achilles tendon and fascia.  This was debrided back to bleeding viable tissue.  There is no deep abscess no necrotizing fasciitis.  There was good petechial bleeding over the entire wound surface.  This was then irrigated with pulsatile lavage.  The allograft split-thickness skin graft was then applied 250 cm.  The wound was then covered with gauze sponge with TXA.  A cleanse choice VAC sponge was then applied with the small overlying foam on the reticular foam.  This was secured with Ioban and had a good suction fit.  Patient was extubated taken to the PACU in stable condition.  Patient will need the portable Praveena plus VAC for discharge to home.

## 2017-11-09 NOTE — Progress Notes (Signed)
PROGRESS NOTE    Emily Wall  IZT:[id number] DOB: 06-14-1936 DOA: 11/02/2017 PCP: Chevis Pretty, FNP  Brief Narrative:  The patient is a 81 yo Female with a PMH of Diabetes Mellitus Type 2, Non-ischemic Cardiomyopathy, Aortic Stenosis, Chronic Venous Stasis, HTN, HLD, Hypothyroidism, Osteoporosis and other comorbids who presented to ED at recommendation from a [place] for a Right Leg Wound Infection. She was placed on Antibiotics for a Leg Wound Infection with Cellulitis and placed on IV Vancomycin, IV Cefepime and IV Metronidazole. Ortho and Woc consulted, Case was discussed with Dr. Oneida Alar and will hold off on formally consulting Vascular Surgery until Orthopedic Evaluation. Dr. Sharol Given evaluated and requested [place] nurse to apply a Profore wrap to both legs to try to decrease swelling to improve arterial flow and have it changed Wednesday. Plan is for debridement of posterior calf necrotic ulcer and Achilles tendon on Right with application of split-thickness skin graft and a wound VAC on Friday 11/16.   Assessment & Plan:   Chronic venous stasis ulcers with secondary infection and necrosis and DM -right much worse than left -Appreciate [facility] and ortho input -has been on broad coverage with vancomycin cefepime and metronidazole -Cultures negative, de-escalate Abx after surgery tomorrow to Doxy alone, +/- Augmentin -ABIs completed but limited study due to pain in inability to compress adequately -Dr.Sheikh, d/w Vascular Surgery Dr. Oneida Alar and he heldoff on formal consultation until after evaluated by Orthopedic Surgery Dr. Sharol Given -Appreciate Ortho input -plan for debridement of posterior calf necrotic ulcer and Achilles tendon on Right with application of split-thickness skin graft and a wound VAC today per Dr.Duda -Per Ortho - PRAFO on the Right to unload pressure from posterior calf. -Malnutrition, Dietician consult appreciated-supplements as tolerated  Anemia of Chronic  Disease -stable, continue Iron Supplementation   Essential Hypertension - Controlled  - C/w Carvedilol 25 mg po BID, Furosemide 20 mg po Daily, and with Lisinopril    Diabetes Mellitus Type 2 - Patient is supposed to be on Metformin and Januvia, but notes that the Januvia prescription is too expensive for her to afford and has not started this. - Hemoglobin A1c and was 8.1 - continue sensitive sliding scale   Combined Systolic and Diastolic CHF / Aortic Stenosis - Last EF noted to be reported 45% with Grade 2 Diastolic Dysfunction and Moderate aortic Stenosis in 05/2017. - C/w Carvedilol 25 mg po BID,  -cut down lisinopril and hold lasix today -monitor volume status closely  Hyperlipidemia - Continue Atorvastatin 20 mg po Daily  Hypoalbuminemia - Prealbumin was 6.1; Albumin was 2.4 this AM - Nutritionist consulted for Evaluation and Management - Appreciate Rec's and they recommend Prostat BID and Juven Packet BID  Hypomagnesemia -repleted  DVT prophylaxis: Enoxaparin 40 mg sq q24h Code Status: FULL CODE Family Communication: No family present at bedside  Disposition Plan: Remain Inpatient for debridement today, PT eval post op  Consultants:   Orthopedic Surgery  Dietary/Nutritionist   Discussed Case with Vascular Surgery Dr. Oneida Alar    Procedures:  ABI ABI completed:Right ABI not ascertained secondary to pain with compression. However, waveforms and great toe pressure indicates inadequate flow.  Left ABI not ascertained secondary to non compressible vessels.  Posterior tibial signal most likely venous flow.  Great toe pressure is abnormal.       RIGHT    LEFT    PRESSURE WAVEFORM  PRESSURE WAVEFORM  BRACHIAL 111 T BRACHIAL 109 T  DP   DP 300   AT  M AT  PT  M PT 34 DM  PER   PER    GREAT TOE 21 NA GREAT TOE 57 NA    RIGHT LEFT  ABI  Non compressible    Antimicrobials: Anti-infectives (From admission, onward)   Start      Dose/Rate Route Frequency Ordered Stop   11/09/17 1100  ceFAZolin (ANCEF) IVPB 2g/100 mL premix     2 g 200 mL/hr over 30 Minutes Intravenous To ShortStay Surgical 11/08/17 1242 11/09/17 1035   11/03/17 2200  vancomycin (VANCOCIN) 1,250 mg in sodium chloride 0.9 % 250 mL IVPB     1,250 mg 166.7 mL/hr over 90 Minutes Intravenous Every 24 hours 11/03/17 0110     11/03/17 1400  ceFEPIme (MAXIPIME) 1 g in dextrose 5 % 50 mL IVPB     1 g 100 mL/hr over 30 Minutes Intravenous Every 8 hours 11/03/17 0145     11/03/17 0200  metroNIDAZOLE (FLAGYL) IVPB 500 mg     500 mg 100 mL/hr over 60 Minutes Intravenous Every 8 hours 11/03/17 0137     11/03/17 0200  ceFEPIme (MAXIPIME) 2 g in dextrose 5 % 50 mL IVPB     2 g 100 mL/hr over 30 Minutes Intravenous  Once 11/03/17 0146 11/03/17 0503   11/03/17 0000  vancomycin (VANCOCIN) IVPB 1000 mg/200 mL premix     1,000 mg 200 mL/hr over 60 Minutes Intravenous  Once 11/02/17 2355 11/03/17 0216     Subjective: -no complaints, feeling well, dressing not changed yet  Objective: Vitals:   11/09/17 1135 11/09/17 1155 11/09/17 1210 11/09/17 1238  BP: (!) 144/86 132/73  135/64  Pulse: (!) 115 70  68  Resp: 17 11  15   Temp:   97.8 F (36.6 C) 97.7 F (36.5 C)  TempSrc:    Oral  SpO2: (!) 87% 100%  98%  Weight:      Height:        Intake/Output Summary (Last 24 hours) at 11/09/2017 1436 Last data filed at 11/09/2017 1101 Gross per 24 hour  Intake 1220 ml  Output 20 ml  Net 1200 ml   Filed Weights   11/08/17 0500 11/09/17 0500 11/09/17 0934  Weight: 72.4 kg (159 lb 9.8 oz) 71.8 kg (158 lb 4.6 oz) 71.7 kg (158 lb)   Examination: Physical Exam:  Gen: Awake, Alert, Oriented X 3, chronically ill female HEENT: PERRLA, Neck supple, no JVD Lungs: Good air movement bilaterally, CTAB CVS: RRR,No Gallops,Rubs or new Murmurs Abd: soft, Non tender, non distended, BS present Extremities:  Lower extremities with ulcerations with dressing/ACe wrap-didn't  unwrap dressing today  Skin: no new rashes Psychiatry: appropriate mood and affect   Data Reviewed: I have personally reviewed following labs and imaging studies  CBC: Recent Labs  Lab 11/03/17 0825 11/04/17 0317 11/05/17 0539 11/06/17 0827 11/07/17 0415 11/08/17 0708  WBC 4.8 3.9* 3.7* 5.0 4.6 3.3*  NEUTROABS 3.1 2.4 2.1 3.4 2.9  --   HGB 10.2* 9.7* 10.3* 11.0* 9.7* 9.6*  HCT 31.6* 29.9* 30.7* 33.0* 29.8* 30.3*  MCV 91.9 91.4 93.3 93.0 92.5 92.9  PLT 208 202 211 245 222 237   Basic Metabolic Panel: Recent Labs  Lab 11/03/17 0825 11/04/17 0317 11/05/17 0539 11/06/17 0827 11/07/17 0415 11/08/17 0708  NA 133* 136 136 131* 134* 135  K 3.8 3.5 3.7 4.0 3.5 3.6  CL 100* 103 102 98* 102 103  CO2 28 28 28 26 27 28   GLUCOSE 136* 151* 139* 200* 135* 209*  BUN 6 <5* 13 14 17 16   CREATININE 0.63 0.62 0.59 0.63 0.66 0.58  CALCIUM 8.0* 7.7* 7.9* 8.0* 8.0* 8.0*  MG 1.5* 1.8 1.7 1.6* 1.9  --   PHOS 3.0 3.4 2.9 2.5 2.9  --    GFR: Estimated Creatinine Clearance: 57.5 mL/min (by C-G formula based on SCr of 0.58 mg/dL). Liver Function Tests: Recent Labs  Lab 11/03/17 0825 11/04/17 0317 11/05/17 0539 11/06/17 0827 11/07/17 0415  AST 14* 11* 14* 23 20  ALT 10* 9* 9* 14 13*  ALKPHOS 48 43 46 50 44  BILITOT 0.5 0.5 0.8 0.7 0.6  PROT 5.6* 5.6* 5.6* 6.7 6.1*  ALBUMIN 2.4* 2.2* 2.4* 2.6* 2.4*   No results for input(s): LIPASE, AMYLASE in the last 168 hours. No results for input(s): AMMONIA in the last 168 hours. Coagulation Profile: No results for input(s): INR, PROTIME in the last 168 hours. Cardiac Enzymes: No results for input(s): CKTOTAL, CKMB, CKMBINDEX, TROPONINI in the last 168 hours. BNP (last 3 results) No results for input(s): PROBNP in the last 8760 hours. HbA1C: No results for input(s): HGBA1C in the last 72 hours. CBG: Recent Labs  Lab 11/08/17 2104 11/09/17 0627 11/09/17 0909 11/09/17 1110 11/09/17 1307  GLUCAP 196* 137* 116* 145* 188*   Lipid  Profile: No results for input(s): CHOL, HDL, LDLCALC, TRIG, CHOLHDL, LDLDIRECT in the last 72 hours. Thyroid Function Tests: No results for input(s): TSH, T4TOTAL, FREET4, T3FREE, THYROIDAB in the last 72 hours. Anemia Panel: No results for input(s): VITAMINB12, FOLATE, FERRITIN, TIBC, IRON, RETICCTPCT in the last 72 hours. Sepsis Labs: No results for input(s): PROCALCITON, LATICACIDVEN in the last 168 hours.  Recent Results (from the past 240 hour(s))  Blood Cultures x 2 sites     Status: None   Collection Time: 11/03/17  1:10 AM  Result Value Ref Range Status   Specimen Description BLOOD RIGHT ANTECUBITAL  Final   Special Requests   Final    BOTTLES DRAWN AEROBIC AND ANAEROBIC Blood Culture adequate volume   Culture NO GROWTH 5 DAYS  Final   Report Status 11/08/2017 FINAL  Final  Blood Cultures x 2 sites     Status: None   Collection Time: 11/03/17  1:15 AM  Result Value Ref Range Status   Specimen Description BLOOD LEFT ANTECUBITAL  Final   Special Requests   Final    BOTTLES DRAWN AEROBIC AND ANAEROBIC Blood Culture results may not be optimal due to an excessive volume of blood received in culture bottles   Culture NO GROWTH 5 DAYS  Final   Report Status 11/08/2017 FINAL  Final  MRSA PCR Screening     Status: Abnormal   Collection Time: 11/09/17  7:46 AM  Result Value Ref Range Status   MRSA by PCR POSITIVE (A) NEGATIVE Final    Comment:        The GeneXpert MRSA Assay (FDA approved for NASAL specimens only), is one component of a comprehensive MRSA colonization surveillance program. It is not intended to diagnose MRSA infection nor to guide or monitor treatment for MRSA infections. RESULT CALLED TO, READ BACK BY AND VERIFIED WITH: Z. [place] RN 9:45 11/09/17 ([email or web address])      Radiology Studies: No results found. Scheduled Meds: . aspirin  325 mg Oral Daily  . atorvastatin  20 mg Oral Daily  . carvedilol  25 mg Oral BID WC  . [START ON 11/10/2017] Chlorhexidine  Gluconate Cloth  6 each Topical Q0600  . docusate sodium  100  mg Oral BID  . enoxaparin (LOVENOX) injection  40 mg Subcutaneous Q24H  . feeding supplement (PRO-STAT SUGAR FREE 64)  30 mL Oral BID  . ferrous sulfate  325 mg Oral Q breakfast  . insulin aspart  0-5 Units Subcutaneous QHS  . insulin aspart  0-9 Units Subcutaneous TID WC  . lisinopril  20 mg Oral Daily  . mupirocin ointment  1 application Nasal BID  . nutrition supplement (JUVEN)  1 packet Oral BID BM  . oxyCODONE       Continuous Infusions: . sodium chloride 10 mL/hr at 11/09/17 1253  . ceFEPime (MAXIPIME) IV 1 g (11/09/17 1400)  . lactated ringers 10 mL/hr at 11/09/17 0942  . methocarbamol (ROBAXIN)  IV    . metronidazole 500 mg (11/09/17 1253)  . vancomycin Stopped (11/08/17 2301)    LOS: 6 days   Domenic Polite, MD [facility] Page via [email or web address], password [email or web address]  If 7PM-7AM, please contact night-coverage [email or web address] Password [email or web address] 11/09/2017, 2:36 PM

## 2017-11-09 NOTE — Anesthesia Postprocedure Evaluation (Signed)
Anesthesia Post Note  Patient: Emily Wall  Procedure(s) Performed: RIGHT LEG DEBRIDE ACHILLES, SPLIT THICKNESS SKIN GRAFT, VAC (Right Leg Lower)     Patient location during evaluation: PACU Anesthesia Type: General Level of consciousness: awake and alert Pain management: pain level controlled Vital Signs Assessment: post-procedure vital signs reviewed and stable Respiratory status: spontaneous breathing, nonlabored ventilation and respiratory function stable Cardiovascular status: blood pressure returned to baseline and stable Postop Assessment: no apparent nausea or vomiting Anesthetic complications: no    Last Vitals:  Vitals:   11/09/17 1155 11/09/17 1210  BP: 132/73   Pulse: 70   Resp: 11   Temp:  36.6 C  SpO2: 100%     Last Pain:  Vitals:   11/09/17 1210  TempSrc:   PainSc: Asleep                 Lynda Rainwater

## 2017-11-09 NOTE — Transfer of Care (Signed)
Immediate Anesthesia Transfer of Care Note  Patient: Emily Wall  Procedure(s) Performed: RIGHT LEG DEBRIDE ACHILLES, SPLIT THICKNESS SKIN GRAFT, VAC (Right Leg Lower)  Patient Location: PACU  Anesthesia Type:General  Level of Consciousness: awake, alert  and patient cooperative  Airway & Oxygen Therapy: Patient Spontanous Breathing  Post-op Assessment: Report given to RN and Post -op Vital signs reviewed and stable  Post vital signs: Reviewed and stable  Last Vitals:  Vitals:   11/09/17 0419 11/09/17 0900  BP: 137/60 127/79  Pulse: 80 85  Resp: 16   Temp: 36.7 C 37.2 C  SpO2: 100%     Last Pain:  Vitals:   11/09/17 0900  TempSrc: Oral  PainSc:       Patients Stated Pain Goal: 3 (70/34/03 5248)  Complications: No apparent anesthesia complications

## 2017-11-09 NOTE — Anesthesia Preprocedure Evaluation (Addendum)
Anesthesia Evaluation  Patient identified by MRN, date of birth, ID band Patient awake    Reviewed: Allergy & Precautions, NPO status , Patient's Chart, lab work & pertinent test results  Airway Mallampati: II  TM Distance: >3 FB Neck ROM: Full    Dental no notable dental hx. (+) Missing, Edentulous Upper, Poor Dentition   Pulmonary neg pulmonary ROS, former smoker,    Pulmonary exam normal breath sounds clear to auscultation       Cardiovascular hypertension, + CAD and + Peripheral Vascular Disease  negative cardio ROS Normal cardiovascular exam Rhythm:Regular Rate:Normal + Systolic murmurs    Neuro/Psych negative neurological ROS  negative psych ROS   GI/Hepatic negative GI ROS, Neg liver ROS,   Endo/Other  negative endocrine ROSdiabetes, Type 2, Oral Hypoglycemic Agents  Renal/GU negative Renal ROS  negative genitourinary   Musculoskeletal negative musculoskeletal ROS (+)   Abdominal   Peds negative pediatric ROS (+)  Hematology negative hematology ROS (+) anemia ,   Anesthesia Other Findings   Reproductive/Obstetrics negative OB ROS                            Anesthesia Physical Anesthesia Plan  ASA: III  Anesthesia Plan: General   Post-op Pain Management:    Induction: Intravenous  PONV Risk Score and Plan: 3 and Ondansetron, Midazolam and Treatment may vary due to age or medical condition  Airway Management Planned: LMA  Additional Equipment:   Intra-op Plan:   Post-operative Plan: Extubation in OR  Informed Consent: I have reviewed the patients History and Physical, chart, labs and discussed the procedure including the risks, benefits and alternatives for the proposed anesthesia with the patient or authorized representative who has indicated his/her understanding and acceptance.   Dental advisory given  Plan Discussed with: CRNA  Anesthesia Plan Comments:          Anesthesia Quick Evaluation

## 2017-11-09 NOTE — Interval H&P Note (Signed)
History and Physical Interval Note:  11/09/2017 6:32 AM  Emily Wall  has presented today for surgery, with the diagnosis of Wound Right Leg  The various methods of treatment have been discussed with the patient and family. After consideration of risks, benefits and other options for treatment, the patient has consented to  Procedure(s): RIGHT LEG DEBRIDE ACHILLES, SPLIT THICKNESS SKIN GRAFT, VAC (Right) as a surgical intervention .  The patient's history has been reviewed, patient examined, no change in status, stable for surgery.  I have reviewed the patient's chart and labs.  Questions were answered to the patient's satisfaction.     Newt Minion

## 2017-11-10 LAB — GLUCOSE, CAPILLARY
GLUCOSE-CAPILLARY: 161 mg/dL — AB (ref 65–99)
GLUCOSE-CAPILLARY: 189 mg/dL — AB (ref 65–99)
GLUCOSE-CAPILLARY: 118 mg/dL — AB (ref 65–99)

## 2017-11-10 NOTE — Evaluation (Signed)
Physical Therapy Evaluation Patient Details Name: Emily Wall MRN: [id number] DOB: 1936/06/20 Today's Date: 11/10/2017   History of Present Illness  Pt is a 81 y.o. female with medical history significant of DM type 2, NICM, aortic stenosis, and chronic venous stasis ulcers. She presented at the advisement of the wound care center for a right leg wound infection.  Pt underwent I&D, skin grafting and vac placement 11-09-17.   Clinical Impression  Pt admitted with above diagnosis. Pt currently with functional limitations due to the deficits listed below (see PT Problem List). On eval, pt required min guard assist transfers and gait 50 feet with RW. Pt will benefit from skilled PT to increase their independence and safety with mobility to allow discharge to the venue listed below.  Pt has RW and rollator for home.     Follow Up Recommendations Home health PT    Equipment Recommendations  None recommended by PT    Recommendations for Other Services       Precautions / Restrictions Precautions Precautions: None Restrictions RLE Weight Bearing: Weight bearing as tolerated LLE Weight Bearing: Weight bearing as tolerated      Mobility  Bed Mobility Overal bed mobility: Modified Independent                Transfers Overall transfer level: Needs assistance Equipment used: Rolling walker (2 wheeled) Transfers: Sit to/from Stand Sit to Stand: Min guard         General transfer comment: verbal cues for hand placement  Ambulation/Gait Ambulation/Gait assistance: Min guard Ambulation Distance (Feet): 50 Feet Assistive device: Rolling walker (2 wheeled) Gait Pattern/deviations: Antalgic;Decreased stride length Gait velocity: decreased Gait velocity interpretation: Below normal speed for age/gender General Gait Details: slow, steady gait  Stairs            Wheelchair Mobility    Modified Rankin (Stroke Patients Only)       Balance Overall balance  assessment: No apparent balance deficits (not formally assessed)                                           Pertinent Vitals/Pain Pain Assessment: Faces Faces Pain Scale: Hurts a little bit Pain Location: R foot Pain Descriptors / Indicators: Sore Pain Intervention(s): Monitored during session    Home Living Family/patient expects to be discharged to:: Private residence Living Arrangements: Children Available Help at Discharge: Family;Available 24 hours/day Type of Home: House Home Access: Stairs to enter Entrance Stairs-Rails: Chemical engineer of Steps: 4 Home Layout: Able to live on main level with bedroom/bathroom Home Equipment: Walker - 4 wheels;Walker - 2 wheels      Prior Function Level of Independence: Independent               Hand Dominance        Extremity/Trunk Assessment                Communication   Communication: No difficulties  Cognition Arousal/Alertness: Awake/alert Behavior During Therapy: WFL for tasks assessed/performed Overall Cognitive Status: Within Functional Limits for tasks assessed                                        General Comments      Exercises     Assessment/Plan    PT Assessment  Patient needs continued PT services  PT Problem List Decreased mobility;Decreased activity tolerance;Decreased knowledge of use of DME       PT Treatment Interventions DME instruction;Therapeutic activities;Gait training;Therapeutic exercise;Patient/family education;Stair training;Functional mobility training    PT Goals (Current goals can be found in the Care Plan section)  Acute Rehab PT Goals Patient Stated Goal: home PT Goal Formulation: With patient Time For Goal Achievement: 11/17/17 Potential to Achieve Goals: Good    Frequency Min 3X/week   Barriers to discharge        Co-evaluation               AM-PAC PT "6 Clicks" Daily Activity  Outcome Measure  Difficulty turning over in bed (including adjusting bedclothes, sheets and blankets)?: None Difficulty moving from lying on back to sitting on the side of the bed? : A Little Difficulty sitting down on and standing up from a chair with arms (e.g., wheelchair, bedside commode, etc,.)?: A Little Help needed moving to and from a bed to chair (including a wheelchair)?: A Little Help needed walking in hospital room?: A Little Help needed climbing 3-5 steps with a railing? : A Little 6 Click Score: 19    End of Session Equipment Utilized During Treatment: Gait belt Activity Tolerance: Patient tolerated treatment well Patient left: in chair;with call bell/phone within reach Nurse Communication: Mobility status;Weight bearing status PT Visit Diagnosis: Difficulty in walking, not elsewhere classified (R26.2)    Time: 9826-4158 PT Time Calculation (min) (ACUTE ONLY): 14 min   Charges:   PT Evaluation $PT Eval Moderate Complexity: 1 Mod     PT G Codes:        Lorrin Goodell, PT  Office # [phone] Pager [phone]   Lorriane Shire 11/10/2017, 1:00 PM

## 2017-11-10 NOTE — Progress Notes (Signed)
Patient ID: Emily Wall, female   DOB: 1936/08/23, 81 y.o.   MRN: [id number]  PROGRESS NOTE    Emily Wall  WUJ:[id number] DOB: 1936-11-26 DOA: 11/02/2017  PCP: Chevis Pretty, FNP   Brief Narrative:   81 year old female with history of diabetes mellitus, non-ischemic cardiomyopathy, aortic stenosis, chronic venous stasis, hypertension, dyslipidemia who presented to ED from rehab center for tight left wound infection. Pt was on IV vanco, cefepime and flagyl and wound care dressing changes but failed this conservative treatment. She was taken to OR 11/10/2015 for debridement of posterior calf necrotic ulcer and achilles tendon on right with application of split thickness graft and wound vac by Dr. Meridee Score.  Assessment & Plan:   Principal Problem: Chronic venous stasis ulcers with secondary infection and necrosis and DM - Pt has been on broad spectrum coverage: vanco, cefepime and flagyl - Vascular surgery consultation held off until ortho evaluation  - ABI's completed but limited due to pain and inability to compress adequately  - Pt is status post debridement of posterior calf necrotic ulcer and achilles tendon on the right with application of split-thickness skin graft and wound vac done 11/16  - WBC count little down today; will follow up CBC in am and if stable will transition to Doxy and Augmentin tomorrow - Cultures negative  Active Problems:  Anemia of Chronic Disease - Continue iron supplementation     CombinedSystolic and Diastolic CHF / Aortic Stenosis - Last EF reported 45% with grade 2 DD and moderate aortic stenosis in 05/2017 - Monitor volume closely - Continue coreg    Moderate protein calorie malnutrition / Hypoalbuminemia  - Diet as tolerated     Diabetes mellitus with peripheral circularity complications with long term insulin use - CBG's in past 24 hours: 188, 161, 189 - Continue SSI - A1c 8.1    Atherosclerosis of native arteries of  extremities with gangrene, right leg (HCC) - Continue aspirin and statin therapy     Essential hypertension - Continue carvedilol 25 mg BID and lisinopril 20 mg daily     Dyslipidemia associated with type 2 DM - Continue Lipitor 20 mg daily     Hypomagnesemia - Supplemented and magnesium level WNL   DVT prophylaxis: Lovenox subQ Code Status: full code  Family Communication: no family at the bedside this am Disposition Plan: not yet stable for discharge   Consultants:   Ortho, Dr. Meridee Score  Procedures:   Right left debride achilles, split thickness skin graft, Vac right lower leg 11/09/2017   Antimicrobials:   Vanco, cefepime and flagyl 11/10 -->    Subjective: No overnight events.  Objective: Vitals:   11/09/17 1210 11/09/17 1238 11/09/17 2000 11/10/17 0524  BP:  135/64 140/73 121/62  Pulse:  68 74 73  Resp:  15  16  Temp: 97.8 F (36.6 C) 97.7 F (36.5 C)  98.4 F (36.9 C)  TempSrc:  Oral    SpO2:  98% 100% 98%  Weight:      Height:        Intake/Output Summary (Last 24 hours) at 11/10/2017 1458 Last data filed at 11/10/2017 0400 Gross per 24 hour  Intake 151.17 ml  Output -  Net 151.17 ml   Filed Weights   11/08/17 0500 11/09/17 0500 11/09/17 0934  Weight: 72.4 kg (159 lb 9.8 oz) 71.8 kg (158 lb 4.6 oz) 71.7 kg (158 lb)    Examination:  General exam: Appears calm and comfortable  Respiratory system: Clear  to auscultation. Respiratory effort normal. Cardiovascular system: S1 & S2 heard, Rate controlled  Gastrointestinal system: Abdomen is nondistended, soft and nontender. No organomegaly or masses felt. Normal bowel sounds heard. Central nervous system: Alert and oriented. No focal neurological deficits. Extremities: palpable pulses, tender over LE Skin: RLE dressing in place Psychiatry: Judgement and insight appear normal. Mood & affect appropriate.   Data Reviewed: I have personally reviewed following labs and imaging  studies  CBC: Recent Labs  Lab 11/04/17 0317 11/05/17 0539 11/06/17 0827 11/07/17 0415 11/08/17 0708  WBC 3.9* 3.7* 5.0 4.6 3.3*  NEUTROABS 2.4 2.1 3.4 2.9  --   HGB 9.7* 10.3* 11.0* 9.7* 9.6*  HCT 29.9* 30.7* 33.0* 29.8* 30.3*  MCV 91.4 93.3 93.0 92.5 92.9  PLT 202 211 245 222 809   Basic Metabolic Panel: Recent Labs  Lab 11/04/17 0317 11/05/17 0539 11/06/17 0827 11/07/17 0415 11/08/17 0708  NA 136 136 131* 134* 135  K 3.5 3.7 4.0 3.5 3.6  CL 103 102 98* 102 103  CO2 28 28 26 27 28   GLUCOSE 151* 139* 200* 135* 209*  BUN <5* 13 14 17 16   CREATININE 0.62 0.59 0.63 0.66 0.58  CALCIUM 7.7* 7.9* 8.0* 8.0* 8.0*  MG 1.8 1.7 1.6* 1.9  --   PHOS 3.4 2.9 2.5 2.9  --    GFR: Estimated Creatinine Clearance: 57.5 mL/min (by C-G formula based on SCr of 0.58 mg/dL). Liver Function Tests: Recent Labs  Lab 11/04/17 0317 11/05/17 0539 11/06/17 0827 11/07/17 0415  AST 11* 14* 23 20  ALT 9* 9* 14 13*  ALKPHOS 43 46 50 44  BILITOT 0.5 0.8 0.7 0.6  PROT 5.6* 5.6* 6.7 6.1*  ALBUMIN 2.2* 2.4* 2.6* 2.4*   No results for input(s): LIPASE, AMYLASE in the last 168 hours. No results for input(s): AMMONIA in the last 168 hours. Coagulation Profile: No results for input(s): INR, PROTIME in the last 168 hours. Cardiac Enzymes: No results for input(s): CKTOTAL, CKMB, CKMBINDEX, TROPONINI in the last 168 hours. BNP (last 3 results) No results for input(s): PROBNP in the last 8760 hours. HbA1C: No results for input(s): HGBA1C in the last 72 hours. CBG: Recent Labs  Lab 11/09/17 1307 11/09/17 1634 11/09/17 2235 11/10/17 0540 11/10/17 1148  GLUCAP 188* 169* 188* 161* 189*   Lipid Profile: No results for input(s): CHOL, HDL, LDLCALC, TRIG, CHOLHDL, LDLDIRECT in the last 72 hours. Thyroid Function Tests: No results for input(s): TSH, T4TOTAL, FREET4, T3FREE, THYROIDAB in the last 72 hours. Anemia Panel: No results for input(s): VITAMINB12, FOLATE, FERRITIN, TIBC, IRON,  RETICCTPCT in the last 72 hours. Urine analysis: No results found for: COLORURINE, APPEARANCEUR, LABSPEC, PHURINE, GLUCOSEU, HGBUR, BILIRUBINUR, KETONESUR, PROTEINUR, UROBILINOGEN, NITRITE, LEUKOCYTESUR Sepsis Labs: @LABRCNTIP (procalcitonin:4,lacticidven:4)   Blood Cultures x 2 sites     Status: None   Collection Time: 11/03/17  1:10 AM  Result Value Ref Range Status   Specimen Description BLOOD RIGHT ANTECUBITAL  Final   Special Requests   Final    BOTTLES DRAWN AEROBIC AND ANAEROBIC Blood Culture adequate volume   Culture NO GROWTH 5 DAYS  Final   Report Status 11/08/2017 FINAL  Final  Blood Cultures x 2 sites     Status: None   Collection Time: 11/03/17  1:15 AM  Result Value Ref Range Status   Specimen Description BLOOD LEFT ANTECUBITAL  Final   Special Requests   Final    BOTTLES DRAWN AEROBIC AND ANAEROBIC Blood Culture results may not be optimal due  to an excessive volume of blood received in culture bottles   Culture NO GROWTH 5 DAYS  Final   Report Status 11/08/2017 FINAL  Final  MRSA PCR Screening     Status: Abnormal   Collection Time: 11/09/17  7:46 AM  Result Value Ref Range Status   MRSA by PCR POSITIVE (A) NEGATIVE Final      Radiology Studies: No results found.   Scheduled Meds: . aspirin  325 mg Oral Daily  . atorvastatin  20 mg Oral Daily  . carvedilol  25 mg Oral BID WC  . docusate sodium  100 mg Oral BID  . enoxaparin   40 mg Subcutaneous Q24H  . feeding supplement   30 mL Oral BID  . ferrous sulfate  325 mg Oral Q breakfast  . insulin aspart  0-5 Units Subcutaneous QHS  . insulin aspart  0-9 Units Subcutaneous TID WC  . lisinopril  20 mg Oral Daily  . nutrition supple  1 packet Oral BID BM   Continuous Infusions: . sodium chloride 10 mL/hr at 11/09/17 1253  . ceFEPime (MAXIPIME) IV 1 g (11/10/17 1341)  . lactated ringers 10 mL/hr at 11/09/17 0942  . methocarbamol (ROBAXIN)  IV    . metronidazole Stopped (11/10/17 1031)  . vancomycin Stopped  (11/10/17 0021)     LOS: 7 days    Time spent: 25 minutes  Greater than 50% of the time spent on counseling and coordinating the care.   Leisa Lenz, MD [facility] Pager [phone]  If 7PM-7AM, please contact night-coverage [email or web address] Password [email or web address] 11/10/2017, 2:58 PM

## 2017-11-10 NOTE — Progress Notes (Signed)
Pharmacy Antibiotic Note  Emily Wall is a 81 y.o. female admitted on 11/02/2017 with right leg wounds.  Pharmacy managing Vancomycin/Cefepime dosing. On D#8 of abx. S/p I&D of calf ulcer on 11/16. Afebrile, WBC wnl  Vanc 11/10 >> Cefepime 11/10 >> Flagyl 11/10 >>  11/14 VT 12 on Vanc 1250 mg IV Q 24h   11/10 BCx: negF  Vancomycin trough = 12 mcg/ml on vancomycin 1250 mg IV q24h  Goal Vanc trough = 10-15 mcg/ml  Plan: Continue vancomycin 1,250 mg IV q24h Continue cefepime 1g IV q8h Continue Flagyl 500mg  IV Q8h per MD Monitor clinical picture, renal function, VT prn Per MD, planning to switch to doxycycline +/- augmentin today.    Height: 5\' 5"  (165.1 cm) Weight: 158 lb (71.7 kg) IBW/kg (Calculated) : 57  Temp (24hrs), Avg:98.4 F (36.9 C), Min:98.4 F (36.9 C), Max:98.4 F (36.9 C)  Recent Labs  Lab 11/04/17 0317 11/05/17 0539 11/06/17 0827 11/07/17 0415 11/07/17 2126 11/08/17 0708  WBC 3.9* 3.7* 5.0 4.6  --  3.3*  CREATININE 0.62 0.59 0.63 0.66  --  0.58  VANCOTROUGH  --   --   --   --  12*  --     Estimated Creatinine Clearance: 57.5 mL/min (by C-G formula based on SCr of 0.58 mg/dL).    No Known Allergies  Albertina Parr, PharmD., BCPS Clinical Pharmacist Pager [phone]

## 2017-11-11 DIAGNOSIS — I70238 Atherosclerosis of native arteries of right leg with ulceration of other part of lower right leg: Secondary | ICD-10-CM

## 2017-11-11 LAB — BASIC METABOLIC PANEL
ANION GAP: 6 (ref 5–15)
BUN: 16 mg/dL (ref 6–20)
CO2: 24 mmol/L (ref 22–32)
Calcium: 8.3 mg/dL — ABNORMAL LOW (ref 8.9–10.3)
Chloride: 106 mmol/L (ref 101–111)
Creatinine, Ser: 0.57 mg/dL (ref 0.44–1.00)
GFR calc Af Amer: >60 mL/min (ref >60)
GLUCOSE: 132 mg/dL — AB (ref 65–99)
POTASSIUM: 4.1 mmol/L (ref 3.5–5.1)
Sodium: 136 mmol/L (ref 135–145)

## 2017-11-11 LAB — CBC
HCT: 28.9 % — ABNORMAL LOW (ref 36.0–46.0)
Hemoglobin: 9.2 g/dL — ABNORMAL LOW (ref 12.0–15.0)
MCH: 29.5 pg (ref 26.0–34.0)
MCHC: 31.8 g/dL (ref 30.0–36.0)
MCV: 92.6 fL (ref 78.0–100.0)
Platelets: 219 10*3/uL (ref 150–400)
RBC: 3.12 MIL/uL — AB (ref 3.87–5.11)
RDW: 15.3 % (ref 11.5–15.5)
WBC: 4.5 10*3/uL (ref 4.0–10.5)

## 2017-11-11 LAB — GLUCOSE, CAPILLARY
GLUCOSE-CAPILLARY: 222 mg/dL — AB (ref 65–99)
GLUCOSE-CAPILLARY: 161 mg/dL — AB (ref 65–99)
GLUCOSE-CAPILLARY: 164 mg/dL — AB (ref 65–99)
Glucose-Capillary: 195 mg/dL — ABNORMAL HIGH (ref 65–99)
Glucose-Capillary: 125 mg/dL — ABNORMAL HIGH (ref 65–99)

## 2017-11-11 MED ORDER — AMOXICILLIN-POT CLAVULANATE 875-125 MG PO TABS
1.0000 | ORAL_TABLET | Freq: Two times a day (BID) | ORAL | Status: DC
  Administered 2017-11-11 – 2017-11-14 (×7): 1 via ORAL
  Filled 2017-11-11 (×7): qty 1

## 2017-11-11 NOTE — Progress Notes (Signed)
Patient ID: Emily Wall, female   DOB: [birth year], 81 y.o.   MRN: [id number] Postoperative day 2 debridement of necrotic ulcer right Achilles right posterior calf with application of wound VAC and split-thickness skin graft.  Patient is comfortable there is minimal amount of drainage in the wound VAC canister.  Vascular surgery consult would be appropriate with the gangrenous ulcer over the right Achilles and right posterior medial calf.  Will leave the wound VAC in place for 1 week and then start dressing changes.

## 2017-11-11 NOTE — Progress Notes (Addendum)
   Daily Progress Note  Discussed with Dr. Trula Slade scheduling pt for Aortogram, bilateral leg runoff, and possible right leg intervention on Tuesday.   - Dr. Trula Slade will be covering for me - Preop op orders already entered  Adele Barthel, MD, [facility] Office: [phone] Pager: [phone]  11/11/2017, 8:24 PM

## 2017-11-11 NOTE — H&P (View-Only) (Signed)
Requested by:  Dr. Maylene Roes ([facility])  Reason for consultation: right calf and Achilles heel ulcer   History of Present Illness   Emily Wall is a 81 y.o. (March 31, 1936) female who presents with chief complaint: pain right leg.  Onset of symptoms occurred over the last month.  The patient is unable to clarify the chronology of her leg symptoms.  She has prior history reportedly of venous stasis ulcers that have been managed by an outpatient wound center.  Reported her right leg wound were worsening with more pain.  Pain is described as burning and sharp, severity 5-10/10, and associated with manipulating wounds.  Patient has attempted to treat this pain with wound care.  The patient has no rest pain symptoms.  The patient recent underwent right leg debridement of calf and Achilles tendon followed by placement of skin allograft and VAC with Dr. Sharol Given recommended vascular consultation.    Atherosclerotic risk factors include: HTN, HLD, DM and prior smoker.  Past Medical History:  Diagnosis Date  . Aortic regurgitation    Mild  . Aortic stenosis    Moderate  . Coronary atherosclerosis of native coronary artery    Nonobstructive 2007  . Essential hypertension, benign   . Hyperlipidemia   . Hypothyroidism   . Nonischemic cardiomyopathy (HCC)    LVEF improved to 50-55%  . Osteoporosis   . Type 2 diabetes mellitus ([place])     Past Surgical History:  Procedure Laterality Date  . SKIN GRAFT      Social History   Socioeconomic History  . Marital status: Widowed    Spouse name: Not on file  . Number of children: 2  . Years of education: Not on file  . Highest education level: Not on file  Social Needs  . Financial resource strain: Not on file  . Food insecurity - worry: Not on file  . Food insecurity - inability: Not on file  . Transportation needs - medical: Not on file  . Transportation needs - non-medical: Not on file  Occupational History    Employer: RETIRED    Tobacco Use  . Smoking status: Former Smoker    Packs/day: 0.50    Years: 30.00    Pack years: 15.00    Types: Cigarettes    Last attempt to quit: 12/25/1988    Years since quitting: 28.8  . Smokeless tobacco: Never Used  Substance and Sexual Activity  . Alcohol use: No    Alcohol/week: 0.0 oz  . Drug use: No  . Sexual activity: No  Other Topics Concern  . Not on file  Social History Narrative  . Not on file    Family History  Problem Relation Age of Onset  . Colon cancer Mother   . Colon cancer Brother   . Diabetes Brother   . Prostate cancer Brother     Current Facility-Administered Medications  Medication Dose Route Frequency Provider Last Rate Last Dose  . 0.9 %  sodium chloride infusion   Intravenous Continuous Newt Minion, MD 10 mL/hr at 11/09/17 1253    . acetaminophen (TYLENOL) tablet 650 mg  650 mg Oral Q4H PRN Newt Minion, MD   650 mg at 11/11/17 0140   Or  . acetaminophen (TYLENOL) suppository 650 mg  650 mg Rectal Q4H PRN Newt Minion, MD      . amoxicillin-clavulanate (AUGMENTIN) 875-125 MG per tablet 1 tablet  1 tablet Oral Q12H Dessa Phi, DO   1 tablet at 11/11/17  1312  . aspirin tablet 325 mg  325 mg Oral Daily Smith, Rondell A, MD   325 mg at 11/11/17 1005  . atorvastatin (LIPITOR) tablet 20 mg  20 mg Oral Daily Tamala Julian, Rondell A, MD   20 mg at 11/11/17 1005  . bisacodyl (DULCOLAX) suppository 10 mg  10 mg Rectal Daily PRN Newt Minion, MD      . carvedilol (COREG) tablet 25 mg  25 mg Oral BID WC Smith, Rondell A, MD   25 mg at 11/11/17 1005  . Chlorhexidine Gluconate Cloth 2 % PADS 6 each  6 each Topical Q0600 Domenic Polite, MD   6 each at 11/11/17 [phone]  . docusate sodium (COLACE) capsule 100 mg  100 mg Oral BID Newt Minion, MD   100 mg at 11/11/17 1007  . enoxaparin (LOVENOX) injection 40 mg  40 mg Subcutaneous Q24H Smith, Rondell A, MD   40 mg at 11/11/17 1007  . feeding supplement (PRO-STAT SUGAR FREE 64) liquid 30 mL  30 mL Oral BID  Sheikh, Georgina Quint Latif, DO   30 mL at 11/11/17 1004  . ferrous sulfate tablet 325 mg  325 mg Oral Q breakfast Fuller Plan A, MD   325 mg at 11/11/17 1005  . HYDROcodone-acetaminophen (NORCO/VICODIN) 5-325 MG per tablet 1 tablet  1 tablet Oral Q4H PRN Raiford Noble [place], DO   1 tablet at 11/11/17 0423  . insulin aspart (novoLOG) injection 0-5 Units  0-5 Units Subcutaneous QHS Norval Morton, MD   2 Units at 11/07/17 2232  . insulin aspart (novoLOG) injection 0-9 Units  0-9 Units Subcutaneous TID WC Norval Morton, MD   2 Units at 11/11/17 1312  . lactated ringers infusion   Intravenous Continuous Lynda Rainwater, MD 10 mL/hr at 11/09/17 [phone]    . lisinopril (PRINIVIL,ZESTRIL) tablet 20 mg  20 mg Oral Daily Domenic Polite, MD   20 mg at 11/11/17 1007  . magnesium citrate solution 1 Bottle  1 Bottle Oral Once PRN Newt Minion, MD      . methocarbamol (ROBAXIN) tablet 500 mg  500 mg Oral Q6H PRN Newt Minion, MD       Or  . methocarbamol (ROBAXIN) 500 mg in dextrose 5 % 50 mL IVPB  500 mg Intravenous Q6H PRN Newt Minion, MD      . metoCLOPramide (REGLAN) tablet 5-10 mg  5-10 mg Oral Q8H PRN Newt Minion, MD       Or  . metoCLOPramide (REGLAN) injection 5-10 mg  5-10 mg Intravenous Q8H PRN Newt Minion, MD      . mupirocin ointment (BACTROBAN) 2 % 1 application  1 application Nasal BID Domenic Polite, MD   1 application at 67/89/38 1005  . nutrition supplement (JUVEN) (JUVEN) powder packet 1 packet  1 packet Oral BID BM Raiford Noble Dobson, DO   1 packet at 11/11/17 1312  . ondansetron (ZOFRAN) tablet 4 mg  4 mg Oral Q6H PRN Newt Minion, MD       Or  . ondansetron [facility]) injection 4 mg  4 mg Intravenous Q6H PRN Newt Minion, MD   4 mg at 11/09/17 1825  . oxyCODONE (Oxy IR/ROXICODONE) immediate release tablet 10 mg  10 mg Oral Q3H PRN Newt Minion, MD      . polyethylene glycol (MIRALAX / GLYCOLAX) packet 17 g  17 g Oral Daily PRN Newt Minion, MD  No Known  Allergies  REVIEW OF SYSTEMS (negative unless checked):   Cardiac:  []  Chest pain or chest pressure? []  Shortness of breath upon activity? []  Shortness of breath when lying flat? []  Irregular heart rhythm?  Vascular:  [x]  Pain in calf, thigh, or hip brought on by walking? [x]  Pain in feet at night that wakes you up from your sleep? []  Blood clot in your veins? [x]  Leg swelling?  Pulmonary:  []  Oxygen at home? []  Productive cough? []  Wheezing?  Neurologic:  []  Sudden weakness in arms or legs? []  Sudden numbness in arms or legs? []  Sudden onset of difficult speaking or slurred speech? []  Temporary loss of vision in one eye? []  Problems with dizziness?  Gastrointestinal:  []  Blood in stool? []  Vomited blood?  Genitourinary:  []  Burning when urinating? []  Blood in urine?  Psychiatric:  []  Major depression  Hematologic:  []  Bleeding problems? []  Problems with blood clotting?  Dermatologic:  [x]  Rashes or ulcers?  Constitutional:  []  Fever or chills?  Ear/Nose/Throat:  []  Change in hearing? []  Nose bleeds? []  Sore throat?  Musculoskeletal:  []  Back pain? []  Joint pain? []  Muscle pain?   For VQI Use Only   PRE-ADM LIVING Home  AMB STATUS Ambulatory  CAD Sx None  PRIOR CHF None  STRESS TEST No    Physical Examination     Vitals:   11/10/17 2346 11/11/17 0441 11/11/17 1004 11/11/17 1215  BP:  124/65 130/70 111/64  Pulse:  73  70  Resp:  14  15  Temp:  98.1 F (36.7 C)  98.4 F (36.9 C)  TempSrc:    Oral  SpO2:  100%  100%  Weight: 163 lb 5.8 oz (74.1 kg)     Height:       Body mass index is 27.18 kg/m.  General Alert, O x 3, WD, NAD  [place]/AT,    Ear/Nose/ Throat Hearing grossly intact, nares without erythema or drainage, oropharynx without Erythema or Exudate, Mallampati score: 3, some missing teeth (upper>lower jaw)   Eyes PERRLA, EOMI,    Neck Supple, mid-line trachea, cervical LAD  Pulmonary Sym exp, good B air movt, CTA B    Cardiac RRR, Nl S1, S2, Murmur present: SEM, No rubs, No S3,S4  Vascular Vessel Right Left  Radial Palpable Palpable  Brachial Palpable Palpable  Carotid Palpable, No Bruit Palpable, No Bruit  Aorta Not palpable N/A  Femoral Palpable Palpable  Popliteal Not palpable Not palpable  PT Not palpable Not palpable  DP Not palpable Not palpable    Gastro- intestinal soft, non-distended, non-tender to palpation, No guarding or rebound, no HSM, no masses, no CVAT B, No palpable prominent aortic pulse,    Musculo- skeletal M/S 5/5 throughout  , VAC on R posterior calf, [place] on left calf, limited exam of both calves due to dressings  Neurologic Cranial nerves 2-12 intact , Pain and light touch intact in extremities , Motor exam as listed above  Psychiatric Judgement intact, Mood & affect appropriate for pt's clinical situation  Dermatologic See M/S exam for extremity exam, No rashes otherwise noted  Lymphatic  Palpable lymph nodes: Cervical    Non-Invasive Vascular Imaging   ABI (11/04/17) ABI Findings: +---------+------------------+-----+----------+--------+ Right  Rt Pressure (mmHg)IndexWaveform Comment  +---------+------------------+-----+----------+--------+ Brachial 111           triphasic      +---------+------------------+-----+----------+--------+ PTA               monophasic     +---------+------------------+-----+----------+--------+  DP                monophasic     +---------+------------------+-----+----------+--------+ Emily Wall                     +---------+------------------+-----+----------+--------+  +---------+------------------+-----+---------+--------------------+ Left   Lt Pressure (mmHg)IndexWaveform Comment        +---------+------------------+-----+---------+--------------------+ Brachial 109           triphasic            +---------+------------------+-----+---------+--------------------+ PTA   300        2.70                 +---------+------------------+-----+---------+--------------------+ DP    34        0.31      probably venous flow +---------+------------------+-----+---------+--------------------+ Emily Wall                           +---------+------------------+-----+---------+--------------------+  +-------+----------------+----------------+ ABI/TBIToday's ABI/TBI Previous ABI/TBI +-------+----------------+----------------+ Right not ascertained non compressible +-------+----------------+----------------+ Left  non compressiblenon compressible +-------+----------------+----------------+     Final Interpretation: Right: The right toe-brachial index is abnormal. PPG tracings appear dampened. Patient unable to tolerate cuff pressure secondary to cellulitis and ulcerations. However, waveforms and great toe pressure are abnormal. Left: Resting left ankle-brachial index indicates noncompressible right lower extremity arteries.The left toe-brachial index is abnormal.  Laboratory   CBC CBC Latest Ref Rng & Units 11/11/2017 11/08/2017 11/07/2017  WBC 4.0 - 10.5 K/uL 4.5 3.3(L) 4.6  Hemoglobin 12.0 - 15.0 g/dL 9.2(L) 9.6(L) 9.7(L)  Hematocrit 36.0 - 46.0 % 28.9(L) 30.3(L) 29.8(L)  Platelets 150 - 400 K/uL 219 214 222    BMP BMP Latest Ref Rng & Units 11/11/2017 11/08/2017 11/07/2017  Glucose 65 - 99 mg/dL 132(H) 209(H) 135(H)  BUN 6 - 20 mg/dL 16 16 17   Creatinine 0.44 - 1.00 mg/dL 0.57 0.58 0.66  BUN/Creat Ratio 12 - 28 - - -  Sodium 135 - 145 mmol/L 136 135 134(L)  Potassium 3.5 - 5.1 mmol/L 4.1 3.6 3.5  Chloride 101 - 111 mmol/L 106 103 102  CO2 22 - 32 mmol/L 24 28 27   Calcium 8.9 - 10.3 mg/dL 8.3(L) 8.0(L) 8.0(L)    Coagulation No results found for: INR, PROTIME No results found  for: PTT  Lipids    Component Value Date/Time   CHOL 70 11/05/2017 0539   CHOL 90 (L) 05/29/2017 1421   CHOL 119 06/02/2013 1318   TRIG 35 11/05/2017 0539   TRIG 50 02/01/2015 0913   TRIG 41 06/02/2013 1318   HDL 33 (L) 11/05/2017 0539   HDL 41 05/29/2017 1421   HDL 53 02/01/2015 0913   HDL 52 06/02/2013 1318   CHOLHDL 2.1 11/05/2017 0539   VLDL 7 11/05/2017 0539   LDLCALC 30 11/05/2017 0539   LDLCALC 38 05/29/2017 1421   LDLCALC 49 06/22/2014 1630   LDLCALC 59 06/02/2013 1318    HgA1c 8.1 (11/03/17)   Radiology     R tib-fem XRAY (11/03/17): No definitive radiographic correlate for reported area of lower leg wound. No radiographic evidence of osteomyelitis.  R ankle XRAY (11/03/17): Potential minimal amount of soft tissue swelling about the anterolateral aspect of the ankle without associated fracture, radiopaque foreign body or radiographic evidence of osteomyelitis.   Medical Decision Making   Emily Wall is a 80 y.o. female who presents with: Uncontrolled DM, RLE critical limb ischemia, h/o B VSU, AS, CAD   Unfortunately non-invasive studies  will be of limited value in this patient due to obvious calcification evident on prior ABI.  I discussed with the patient the natural history of critical limb ischemia: 25% require amputation in one year, 50% are able to maintain their limbs in one year, and 25-30% die in one year due to comorbidities.  Given the limb threatening status of this patient, I recommend an aggressive work up including proceeding with an: Aortogram, Bilateral runoff and possible intervention right leg I discussed with the patient the nature of angiographic procedures, especially the limited patencies of any endovascular intervention. The patient is aware of that the risks of an angiographic procedure include but are not limited to: bleeding, infection, access site complications, embolization, rupture of treated vessel, dissection, possible need  for emergent surgical intervention, and possible need for surgical procedures to treat the patient's pathology. The patient is aware of the risks and agrees to proceed.    Unfortunately, I will be on leave this coming week, so one of my partners will assume care of this patient organize subsequent angiography and possible intervention.  Possibly Dr. Trula Slade on Tuesday, 11/13/17.  I discussed in depth with the patient the nature of atherosclerosis, and emphasized the importance of maximal medical management including strict control of blood pressure, blood glucose, and lipid levels, antiplatelet agents, obtaining regular exercise, and cessation of smoking.  The patient is aware that without maximal medical management the underlying atherosclerotic disease process will progress, limiting the benefit of any interventions. The patient is currently on a statin: Lipitor.  The patient is currently on an anti-platelet: ASA.  Thank you for allowing [place] to participate in this patient's care.   Adele Barthel, MD, FACS [facility] Office: [phone] Pager: [phone]  11/11/2017, 2:29 PM

## 2017-11-11 NOTE — Progress Notes (Signed)
Orthopedic Tech Progress Note Patient Details:  Emily Wall [birth year] [id number]  Patient ID: Emily Wall, female   DOB: 1936/12/17, 81 y.o.   MRN: [id number]   Emily Wall 11/11/2017, 8:10 AMCalled [facility] for right [employer]rate foam boot.

## 2017-11-11 NOTE — Progress Notes (Signed)
PROGRESS NOTE    Emily Wall  WJX:[id number] DOB: Apr 20, 1936 DOA: 11/02/2017 PCP: Chevis Pretty, FNP     Brief Narrative:  Emily Wall is a 81 year old female with history of diabetes mellitus, non-ischemic cardiomyopathy, aortic stenosis, chronic venous stasis, hypertension, dyslipidemia who presented to ED from rehab center for right leg wound infection. Patient notes that she has ulcerations present on both lower extremities, but the right leg specifically had become red and swollen with drainage over the last 3 weeks.  She complains of associated pain, but has been taking hydrocodone with adequate relief.  She had been started on ciprofloxacin approximately 1 week ago and reports that the lower extremity redness and swelling had improved.  She was seen in wound care clinic in [place] and because of the severity of the wounds advised her to come in for further evaluation.  Patient notes that she previously had skin grafts of the right lower extremity due to these venous stasis ulcers.  Patient was started on empiric vancomycin, cefepime, Flagyl on admission.  She was evaluated by orthopedic surgery Dr. Sharol Given and underwent debridement of the right Achilles tendon and posterior calf necrotic ulcer with wound VAC placement.  Assessment & Plan:   Principal Problem:   Wound infection Active Problems:   Essential hypertension   Aortic valve disorder   Diabetes mellitus (HCC)   Hypoalbuminemia   Venous stasis ulcer (HCC)   Diabetic leg ulcer ([place])   Atherosclerosis of native arteries of extremities with gangrene, right leg (HCC)   Right lower leg nonhealing, gangrenous wound infection, chronic venous stasis - Status post debridement of the right posterior calf necrotic ulcer and achilles tendon with application of split-thickness skin graft and wound vac with Dr. Sharol Given on 11/16.  Wound VAC in place for 1 more week  - Empirically treated with vancomycin, cefepime, Flagyl. No  cultures available. Patient VSS, WBC normal, and overall improving well. Spoke with Dr. Tommy Medal with ID (curbside consult) for antibiotic recommendation. Recommend Augmentin for 10 days post-debridement (last day tx 11/26).  - ABI unable to be completed on the right due to pain, ABI on left incomplete due to noncompressible vessels - Continue aspirin and statin therapy  - Vascular surgery consulted   Anemia of chronic disease - Hgb stable  Chronic combinedsystolic and diastolic CHF / Aortic Stenosis - Last EF reported 45% with grade 2 DD and moderate aortic stenosis in 05/2017 - Continue coreg, lisinopril  - Stable   Moderate protein calorie malnutrition / Hypoalbuminemia  - Diet as tolerated   Diabetes mellitus with peripheral circularity complications with long term insulin use - Continue SSI - A1c 8.1  Essential hypertension - Continue coreg, lisinopril   Dyslipidemia associated with type 2 DM - Continue Lipitor   DVT prophylaxis: lovenox Code Status: full Family Communication: no family at bedside Disposition Plan: pending improvement    Consultants:   Orthopedic surgery Dr. Sharol Given  Vascular surgery   Procedures:   Right leg debridement of achilles and posterior calf necrotic ulcer, split thickness skin graft, wound vac placement 11/09/2017   Antimicrobials:  Anti-infectives (From admission, onward)   Start     Dose/Rate Route Frequency Ordered Stop   11/11/17 1245  amoxicillin-clavulanate (AUGMENTIN) 875-125 MG per tablet 1 tablet     1 tablet Oral Every 12 hours 11/11/17 1238     11/09/17 1100  ceFAZolin (ANCEF) IVPB 2g/100 mL premix     2 g 200 mL/hr over 30 Minutes Intravenous To ShortStay  Surgical 11/08/17 1242 11/09/17 1035   11/03/17 2200  vancomycin (VANCOCIN) 1,250 mg in sodium chloride 0.9 % 250 mL IVPB  Status:  Discontinued     1,250 mg 166.7 mL/hr over 90 Minutes Intravenous Every 24 hours 11/03/17 0110 11/11/17 1238   11/03/17 1400   ceFEPIme (MAXIPIME) 1 g in dextrose 5 % 50 mL IVPB  Status:  Discontinued     1 g 100 mL/hr over 30 Minutes Intravenous Every 8 hours 11/03/17 0145 11/11/17 1238   11/03/17 0200  metroNIDAZOLE (FLAGYL) IVPB 500 mg  Status:  Discontinued     500 mg 100 mL/hr over 60 Minutes Intravenous Every 8 hours 11/03/17 0137 11/11/17 1238   11/03/17 0200  ceFEPIme (MAXIPIME) 2 g in dextrose 5 % 50 mL IVPB     2 g 100 mL/hr over 30 Minutes Intravenous  Once 11/03/17 0146 11/03/17 0503   11/03/17 0000  vancomycin (VANCOCIN) IVPB 1000 mg/200 mL premix     1,000 mg 200 mL/hr over 60 Minutes Intravenous  Once 11/02/17 2355 11/03/17 0216       Subjective: Feeling well today.  No physical complaints.  No acute events overnight.  Denies any fevers or chills, chest pain, shortness of breath, nausea, vomiting, diarrhea, abd pain.   Objective: Vitals:   11/10/17 2346 11/11/17 0441 11/11/17 1004 11/11/17 1215  BP:  124/65 130/70 111/64  Pulse:  73  70  Resp:  14  15  Temp:  98.1 F (36.7 C)  98.4 F (36.9 C)  TempSrc:    Oral  SpO2:  100%  100%  Weight: 74.1 kg (163 lb 5.8 oz)     Height:        Intake/Output Summary (Last 24 hours) at 11/11/2017 1239 Last data filed at 11/11/2017 1100 Gross per 24 hour  Intake 120 ml  Output -  Net 120 ml   Filed Weights   11/09/17 0500 11/09/17 0934 11/10/17 2346  Weight: 71.8 kg (158 lb 4.6 oz) 71.7 kg (158 lb) 74.1 kg (163 lb 5.8 oz)    Examination:  General exam: Appears calm and comfortable  Respiratory system: Clear to auscultation. Respiratory effort normal. Cardiovascular system: S1 & S2 heard. No JVD, murmurs, rubs, gallops or clicks. +1 edema  Gastrointestinal system: Abdomen is nondistended, soft and nontender. No organomegaly or masses felt. Normal bowel sounds heard. Central nervous system: Alert and oriented. No focal neurological deficits. Extremities: Symmetric  Skin: +wound vac on right calf  Psychiatry: Judgement and insight appear  normal. Mood & affect appropriate.   Data Reviewed: I have personally reviewed following labs and imaging studies  CBC: Recent Labs  Lab 11/05/17 0539 11/06/17 0827 11/07/17 0415 11/08/17 0708 11/11/17 0422  WBC 3.7* 5.0 4.6 3.3* 4.5  NEUTROABS 2.1 3.4 2.9  --   --   HGB 10.3* 11.0* 9.7* 9.6* 9.2*  HCT 30.7* 33.0* 29.8* 30.3* 28.9*  MCV 93.3 93.0 92.5 92.9 92.6  PLT 211 245 222 214 299   Basic Metabolic Panel: Recent Labs  Lab 11/05/17 0539 11/06/17 0827 11/07/17 0415 11/08/17 0708 11/11/17 0422  NA 136 131* 134* 135 136  K 3.7 4.0 3.5 3.6 4.1  CL 102 98* 102 103 106  CO2 28 26 27 28 24   GLUCOSE 139* 200* 135* 209* 132*  BUN 13 14 17 16 16   CREATININE 0.59 0.63 0.66 0.58 0.57  CALCIUM 7.9* 8.0* 8.0* 8.0* 8.3*  MG 1.7 1.6* 1.9  --   --   PHOS 2.9  2.5 2.9  --   --    GFR: Estimated Creatinine Clearance: 58.4 mL/min (by C-G formula based on SCr of 0.57 mg/dL). Liver Function Tests: Recent Labs  Lab 11/05/17 0539 11/06/17 0827 11/07/17 0415  AST 14* 23 20  ALT 9* 14 13*  ALKPHOS 46 50 44  BILITOT 0.8 0.7 0.6  PROT 5.6* 6.7 6.1*  ALBUMIN 2.4* 2.6* 2.4*   No results for input(s): LIPASE, AMYLASE in the last 168 hours. No results for input(s): AMMONIA in the last 168 hours. Coagulation Profile: No results for input(s): INR, PROTIME in the last 168 hours. Cardiac Enzymes: No results for input(s): CKTOTAL, CKMB, CKMBINDEX, TROPONINI in the last 168 hours. BNP (last 3 results) No results for input(s): PROBNP in the last 8760 hours. HbA1C: No results for input(s): HGBA1C in the last 72 hours. CBG: Recent Labs  Lab 11/10/17 1148 11/10/17 1620 11/10/17 2107 11/11/17 0605 11/11/17 1212  GLUCAP 189* 222* 118* 195* 161*   Lipid Profile: No results for input(s): CHOL, HDL, LDLCALC, TRIG, CHOLHDL, LDLDIRECT in the last 72 hours. Thyroid Function Tests: No results for input(s): TSH, T4TOTAL, FREET4, T3FREE, THYROIDAB in the last 72 hours. Anemia Panel: No  results for input(s): VITAMINB12, FOLATE, FERRITIN, TIBC, IRON, RETICCTPCT in the last 72 hours. Sepsis Labs: No results for input(s): PROCALCITON, LATICACIDVEN in the last 168 hours.  Recent Results (from the past 240 hour(s))  Blood Cultures x 2 sites     Status: None   Collection Time: 11/03/17  1:10 AM  Result Value Ref Range Status   Specimen Description BLOOD RIGHT ANTECUBITAL  Final   Special Requests   Final    BOTTLES DRAWN AEROBIC AND ANAEROBIC Blood Culture adequate volume   Culture NO GROWTH 5 DAYS  Final   Report Status 11/08/2017 FINAL  Final  Blood Cultures x 2 sites     Status: None   Collection Time: 11/03/17  1:15 AM  Result Value Ref Range Status   Specimen Description BLOOD LEFT ANTECUBITAL  Final   Special Requests   Final    BOTTLES DRAWN AEROBIC AND ANAEROBIC Blood Culture results may not be optimal due to an excessive volume of blood received in culture bottles   Culture NO GROWTH 5 DAYS  Final   Report Status 11/08/2017 FINAL  Final  MRSA PCR Screening     Status: Abnormal   Collection Time: 11/09/17  7:46 AM  Result Value Ref Range Status   MRSA by PCR POSITIVE (A) NEGATIVE Final    Comment:        The GeneXpert MRSA Assay (FDA approved for NASAL specimens only), is one component of a comprehensive MRSA colonization surveillance program. It is not intended to diagnose MRSA infection nor to guide or monitor treatment for MRSA infections. RESULT CALLED TO, READ BACK BY AND VERIFIED WITH: Z. Calwitan RN 9:45 11/09/17 ([email or web address])        Radiology Studies: No results found.    Scheduled Meds: . amoxicillin-clavulanate  1 tablet Oral Q12H  . aspirin  325 mg Oral Daily  . atorvastatin  20 mg Oral Daily  . carvedilol  25 mg Oral BID WC  . Chlorhexidine Gluconate Cloth  6 each Topical Q0600  . docusate sodium  100 mg Oral BID  . enoxaparin (LOVENOX) injection  40 mg Subcutaneous Q24H  . feeding supplement (PRO-STAT SUGAR FREE 64)  30 mL Oral BID    . ferrous sulfate  325 mg Oral Q breakfast  . insulin aspart  0-5 Units Subcutaneous QHS  . insulin aspart  0-9 Units Subcutaneous TID WC  . lisinopril  20 mg Oral Daily  . mupirocin ointment  1 application Nasal BID  . nutrition supplement (JUVEN)  1 packet Oral BID BM   Continuous Infusions: . sodium chloride 10 mL/hr at 11/09/17 1253  . lactated ringers 10 mL/hr at 11/09/17 0942  . methocarbamol (ROBAXIN)  IV       LOS: 8 days    Time spent: 40 minutes   Dessa Phi, DO [facility] [email or web address] Password [facility] 11/11/2017, 12:39 PM

## 2017-11-11 NOTE — Consult Note (Addendum)
Requested by:  Dr. Maylene Roes ([facility])  Reason for consultation: right calf and Achilles heel ulcer   History of Present Illness   Emily Wall is a 81 y.o. (01-Sep-1936) female who presents with chief complaint: pain right leg.  Onset of symptoms occurred over the last month.  The patient is unable to clarify the chronology of her leg symptoms.  She has prior history reportedly of venous stasis ulcers that have been managed by an outpatient wound center.  Reported her right leg wound were worsening with more pain.  Pain is described as burning and sharp, severity 5-10/10, and associated with manipulating wounds.  Patient has attempted to treat this pain with wound care.  The patient has no rest pain symptoms.  The patient recent underwent right leg debridement of calf and Achilles tendon followed by placement of skin allograft and VAC with Dr. Sharol Given recommended vascular consultation.    Atherosclerotic risk factors include: HTN, HLD, DM and prior smoker.  Past Medical History:  Diagnosis Date  . Aortic regurgitation    Mild  . Aortic stenosis    Moderate  . Coronary atherosclerosis of native coronary artery    Nonobstructive 2007  . Essential hypertension, benign   . Hyperlipidemia   . Hypothyroidism   . Nonischemic cardiomyopathy (HCC)    LVEF improved to 50-55%  . Osteoporosis   . Type 2 diabetes mellitus ([place])     Past Surgical History:  Procedure Laterality Date  . SKIN GRAFT      Social History   Socioeconomic History  . Marital status: Widowed    Spouse name: Not on file  . Number of children: 2  . Years of education: Not on file  . Highest education level: Not on file  Social Needs  . Financial resource strain: Not on file  . Food insecurity - worry: Not on file  . Food insecurity - inability: Not on file  . Transportation needs - medical: Not on file  . Transportation needs - non-medical: Not on file  Occupational History    Employer: RETIRED    Tobacco Use  . Smoking status: Former Smoker    Packs/day: 0.50    Years: 30.00    Pack years: 15.00    Types: Cigarettes    Last attempt to quit: 12/25/1988    Years since quitting: 28.8  . Smokeless tobacco: Never Used  Substance and Sexual Activity  . Alcohol use: No    Alcohol/week: 0.0 oz  . Drug use: No  . Sexual activity: No  Other Topics Concern  . Not on file  Social History Narrative  . Not on file    Family History  Problem Relation Age of Onset  . Colon cancer Mother   . Colon cancer Brother   . Diabetes Brother   . Prostate cancer Brother     Current Facility-Administered Medications  Medication Dose Route Frequency Provider Last Rate Last Dose  . 0.9 %  sodium chloride infusion   Intravenous Continuous Newt Minion, MD 10 mL/hr at 11/09/17 1253    . acetaminophen (TYLENOL) tablet 650 mg  650 mg Oral Q4H PRN Newt Minion, MD   650 mg at 11/11/17 0140   Or  . acetaminophen (TYLENOL) suppository 650 mg  650 mg Rectal Q4H PRN Newt Minion, MD      . amoxicillin-clavulanate (AUGMENTIN) 875-125 MG per tablet 1 tablet  1 tablet Oral Q12H Dessa Phi, DO   1 tablet at 11/11/17  1312  . aspirin tablet 325 mg  325 mg Oral Daily Smith, Rondell A, MD   325 mg at 11/11/17 1005  . atorvastatin (LIPITOR) tablet 20 mg  20 mg Oral Daily Tamala Julian, Rondell A, MD   20 mg at 11/11/17 1005  . bisacodyl (DULCOLAX) suppository 10 mg  10 mg Rectal Daily PRN Newt Minion, MD      . carvedilol (COREG) tablet 25 mg  25 mg Oral BID WC Smith, Rondell A, MD   25 mg at 11/11/17 1005  . Chlorhexidine Gluconate Cloth 2 % PADS 6 each  6 each Topical Q0600 Domenic Polite, MD   6 each at 11/11/17 [phone]  . docusate sodium (COLACE) capsule 100 mg  100 mg Oral BID Newt Minion, MD   100 mg at 11/11/17 1007  . enoxaparin (LOVENOX) injection 40 mg  40 mg Subcutaneous Q24H Smith, Rondell A, MD   40 mg at 11/11/17 1007  . feeding supplement (PRO-STAT SUGAR FREE 64) liquid 30 mL  30 mL Oral BID  Sheikh, Georgina Quint Latif, DO   30 mL at 11/11/17 1004  . ferrous sulfate tablet 325 mg  325 mg Oral Q breakfast Fuller Plan A, MD   325 mg at 11/11/17 1005  . HYDROcodone-acetaminophen (NORCO/VICODIN) 5-325 MG per tablet 1 tablet  1 tablet Oral Q4H PRN Raiford Noble [place], DO   1 tablet at 11/11/17 0423  . insulin aspart (novoLOG) injection 0-5 Units  0-5 Units Subcutaneous QHS Norval Morton, MD   2 Units at 11/07/17 2232  . insulin aspart (novoLOG) injection 0-9 Units  0-9 Units Subcutaneous TID WC Norval Morton, MD   2 Units at 11/11/17 1312  . lactated ringers infusion   Intravenous Continuous Lynda Rainwater, MD 10 mL/hr at 11/09/17 [phone]    . lisinopril (PRINIVIL,ZESTRIL) tablet 20 mg  20 mg Oral Daily Domenic Polite, MD   20 mg at 11/11/17 1007  . magnesium citrate solution 1 Bottle  1 Bottle Oral Once PRN Newt Minion, MD      . methocarbamol (ROBAXIN) tablet 500 mg  500 mg Oral Q6H PRN Newt Minion, MD       Or  . methocarbamol (ROBAXIN) 500 mg in dextrose 5 % 50 mL IVPB  500 mg Intravenous Q6H PRN Newt Minion, MD      . metoCLOPramide (REGLAN) tablet 5-10 mg  5-10 mg Oral Q8H PRN Newt Minion, MD       Or  . metoCLOPramide (REGLAN) injection 5-10 mg  5-10 mg Intravenous Q8H PRN Newt Minion, MD      . mupirocin ointment (BACTROBAN) 2 % 1 application  1 application Nasal BID Domenic Polite, MD   1 application at 29/93/71 1005  . nutrition supplement (JUVEN) (JUVEN) powder packet 1 packet  1 packet Oral BID BM Raiford Noble [place], DO   1 packet at 11/11/17 1312  . ondansetron (ZOFRAN) tablet 4 mg  4 mg Oral Q6H PRN Newt Minion, MD       Or  . ondansetron [facility]) injection 4 mg  4 mg Intravenous Q6H PRN Newt Minion, MD   4 mg at 11/09/17 1825  . oxyCODONE (Oxy IR/ROXICODONE) immediate release tablet 10 mg  10 mg Oral Q3H PRN Newt Minion, MD      . polyethylene glycol (MIRALAX / GLYCOLAX) packet 17 g  17 g Oral Daily PRN Newt Minion, MD  No Known  Allergies  REVIEW OF SYSTEMS (negative unless checked):   Cardiac:  []  Chest pain or chest pressure? []  Shortness of breath upon activity? []  Shortness of breath when lying flat? []  Irregular heart rhythm?  Vascular:  [x]  Pain in calf, thigh, or hip brought on by walking? [x]  Pain in feet at night that wakes you up from your sleep? []  Blood clot in your veins? [x]  Leg swelling?  Pulmonary:  []  Oxygen at home? []  Productive cough? []  Wheezing?  Neurologic:  []  Sudden weakness in arms or legs? []  Sudden numbness in arms or legs? []  Sudden onset of difficult speaking or slurred speech? []  Temporary loss of vision in one eye? []  Problems with dizziness?  Gastrointestinal:  []  Blood in stool? []  Vomited blood?  Genitourinary:  []  Burning when urinating? []  Blood in urine?  Psychiatric:  []  Major depression  Hematologic:  []  Bleeding problems? []  Problems with blood clotting?  Dermatologic:  [x]  Rashes or ulcers?  Constitutional:  []  Fever or chills?  Ear/Nose/Throat:  []  Change in hearing? []  Nose bleeds? []  Sore throat?  Musculoskeletal:  []  Back pain? []  Joint pain? []  Muscle pain?   For VQI Use Only   PRE-ADM LIVING Home  AMB STATUS Ambulatory  CAD Sx None  PRIOR CHF None  STRESS TEST No    Physical Examination     Vitals:   11/10/17 2346 11/11/17 0441 11/11/17 1004 11/11/17 1215  BP:  124/65 130/70 111/64  Pulse:  73  70  Resp:  14  15  Temp:  98.1 F (36.7 C)  98.4 F (36.9 C)  TempSrc:    Oral  SpO2:  100%  100%  Weight: 163 lb 5.8 oz (74.1 kg)     Height:       Body mass index is 27.18 kg/m.  General Alert, O x 3, WD, NAD  Head [place]/AT,    Ear/Nose/ Throat Hearing grossly intact, nares without erythema or drainage, oropharynx without Erythema or Exudate, Mallampati score: 3, some missing teeth (upper>lower jaw)   Eyes PERRLA, EOMI,    Neck Supple, mid-line trachea, cervical LAD  Pulmonary Sym exp, good B air movt, CTA B    Cardiac RRR, Nl S1, S2, Murmur present: SEM, No rubs, No S3,S4  Vascular Vessel Right Left  Radial Palpable Palpable  Brachial Palpable Palpable  Carotid Palpable, No Bruit Palpable, No Bruit  Aorta Not palpable N/A  Femoral Palpable Palpable  Popliteal Not palpable Not palpable  PT Not palpable Not palpable  DP Not palpable Not palpable    Gastro- intestinal soft, non-distended, non-tender to palpation, No guarding or rebound, no HSM, no masses, no CVAT B, No palpable prominent aortic pulse,    Musculo- skeletal M/S 5/5 throughout  , VAC on R posterior calf, [place] on left calf, limited exam of both calves due to dressings  Neurologic Cranial nerves 2-12 intact , Pain and light touch intact in extremities , Motor exam as listed above  Psychiatric Judgement intact, Mood & affect appropriate for pt's clinical situation  Dermatologic See M/S exam for extremity exam, No rashes otherwise noted  Lymphatic  Palpable lymph nodes: Cervical    Non-Invasive Vascular Imaging   ABI (11/04/17) ABI Findings: +---------+------------------+-----+----------+--------+ Right  Rt Pressure (mmHg)IndexWaveform Comment  +---------+------------------+-----+----------+--------+ Brachial 111           triphasic      +---------+------------------+-----+----------+--------+ PTA               monophasic     +---------+------------------+-----+----------+--------+  DP                monophasic     +---------+------------------+-----+----------+--------+ Emily Wall                     +---------+------------------+-----+----------+--------+  +---------+------------------+-----+---------+--------------------+ Left   Lt Pressure (mmHg)IndexWaveform Comment        +---------+------------------+-----+---------+--------------------+ Brachial 109           triphasic            +---------+------------------+-----+---------+--------------------+ PTA   300        2.70                 +---------+------------------+-----+---------+--------------------+ DP    34        0.31      probably venous flow +---------+------------------+-----+---------+--------------------+ Emily Wall                           +---------+------------------+-----+---------+--------------------+  +-------+----------------+----------------+ ABI/TBIToday's ABI/TBI Previous ABI/TBI +-------+----------------+----------------+ Right not ascertained non compressible +-------+----------------+----------------+ Left  non compressiblenon compressible +-------+----------------+----------------+     Final Interpretation: Right: The right toe-brachial index is abnormal. PPG tracings appear dampened. Patient unable to tolerate cuff pressure secondary to cellulitis and ulcerations. However, waveforms and great toe pressure are abnormal. Left: Resting left ankle-brachial index indicates noncompressible right lower extremity arteries.The left toe-brachial index is abnormal.  Laboratory   CBC CBC Latest Ref Rng & Units 11/11/2017 11/08/2017 11/07/2017  WBC 4.0 - 10.5 K/uL 4.5 3.3(L) 4.6  Hemoglobin 12.0 - 15.0 g/dL 9.2(L) 9.6(L) 9.7(L)  Hematocrit 36.0 - 46.0 % 28.9(L) 30.3(L) 29.8(L)  Platelets 150 - 400 K/uL 219 214 222    BMP BMP Latest Ref Rng & Units 11/11/2017 11/08/2017 11/07/2017  Glucose 65 - 99 mg/dL 132(H) 209(H) 135(H)  BUN 6 - 20 mg/dL 16 16 17   Creatinine 0.44 - 1.00 mg/dL 0.57 0.58 0.66  BUN/Creat Ratio 12 - 28 - - -  Sodium 135 - 145 mmol/L 136 135 134(L)  Potassium 3.5 - 5.1 mmol/L 4.1 3.6 3.5  Chloride 101 - 111 mmol/L 106 103 102  CO2 22 - 32 mmol/L 24 28 27   Calcium 8.9 - 10.3 mg/dL 8.3(L) 8.0(L) 8.0(L)    Coagulation No results found for: INR, PROTIME No results found  for: PTT  Lipids    Component Value Date/Time   CHOL 70 11/05/2017 0539   CHOL 90 (L) 05/29/2017 1421   CHOL 119 06/02/2013 1318   TRIG 35 11/05/2017 0539   TRIG 50 02/01/2015 0913   TRIG 41 06/02/2013 1318   HDL 33 (L) 11/05/2017 0539   HDL 41 05/29/2017 1421   HDL 53 02/01/2015 0913   HDL 52 06/02/2013 1318   CHOLHDL 2.1 11/05/2017 0539   VLDL 7 11/05/2017 0539   LDLCALC 30 11/05/2017 0539   LDLCALC 38 05/29/2017 1421   LDLCALC 49 06/22/2014 1630   LDLCALC 59 06/02/2013 1318    HgA1c 8.1 (11/03/17)   Radiology     R tib-fem XRAY (11/03/17): No definitive radiographic correlate for reported area of lower leg wound. No radiographic evidence of osteomyelitis.  R ankle XRAY (11/03/17): Potential minimal amount of soft tissue swelling about the anterolateral aspect of the ankle without associated fracture, radiopaque foreign body or radiographic evidence of osteomyelitis.   Medical Decision Making   Emily Wall is a 81 y.o. female who presents with: Uncontrolled DM, RLE critical limb ischemia, h/o B VSU, AS, CAD   Unfortunately non-invasive studies  will be of limited value in this patient due to obvious calcification evident on prior ABI.  I discussed with the patient the natural history of critical limb ischemia: 25% require amputation in one year, 50% are able to maintain their limbs in one year, and 25-30% die in one year due to comorbidities.  Given the limb threatening status of this patient, I recommend an aggressive work up including proceeding with an: Aortogram, Bilateral runoff and possible intervention right leg I discussed with the patient the nature of angiographic procedures, especially the limited patencies of any endovascular intervention. The patient is aware of that the risks of an angiographic procedure include but are not limited to: bleeding, infection, access site complications, embolization, rupture of treated vessel, dissection, possible need  for emergent surgical intervention, and possible need for surgical procedures to treat the patient's pathology. The patient is aware of the risks and agrees to proceed.    Unfortunately, I will be on leave this coming week, so one of my partners will assume care of this patient organize subsequent angiography and possible intervention.  Possibly Dr. Trula Slade on Tuesday, 11/13/17.  I discussed in depth with the patient the nature of atherosclerosis, and emphasized the importance of maximal medical management including strict control of blood pressure, blood glucose, and lipid levels, antiplatelet agents, obtaining regular exercise, and cessation of smoking.  The patient is aware that without maximal medical management the underlying atherosclerotic disease process will progress, limiting the benefit of any interventions. The patient is currently on a statin: Lipitor.  The patient is currently on an anti-platelet: ASA.  Thank you for allowing [place] to participate in this patient's care.   Adele Barthel, MD, FACS [facility] Office: [phone] Pager: [phone]  11/11/2017, 2:29 PM

## 2017-11-12 ENCOUNTER — Encounter (HOSPITAL_COMMUNITY): Payer: Medicare HMO | Admitting: Physical Therapy

## 2017-11-12 ENCOUNTER — Encounter (HOSPITAL_COMMUNITY): Payer: Self-pay | Admitting: Orthopedic Surgery

## 2017-11-12 LAB — CBC
HCT: 29.1 % — ABNORMAL LOW (ref 36.0–46.0)
Hemoglobin: 9.6 g/dL — ABNORMAL LOW (ref 12.0–15.0)
MCH: 30.5 pg (ref 26.0–34.0)
MCHC: 33 g/dL (ref 30.0–36.0)
MCV: 92.4 fL (ref 78.0–100.0)
PLATELETS: 218 10*3/uL (ref 150–400)
RBC: 3.15 MIL/uL — ABNORMAL LOW (ref 3.87–5.11)
RDW: 15.3 % (ref 11.5–15.5)
WBC: 4.6 10*3/uL (ref 4.0–10.5)

## 2017-11-12 LAB — BASIC METABOLIC PANEL
Anion gap: 5 (ref 5–15)
BUN: 16 mg/dL (ref 6–20)
CHLORIDE: 105 mmol/L (ref 101–111)
CO2: 27 mmol/L (ref 22–32)
CREATININE: 0.55 mg/dL (ref 0.44–1.00)
Calcium: 8.1 mg/dL — ABNORMAL LOW (ref 8.9–10.3)
GFR calc Af Amer: >60 mL/min (ref >60)
Glucose, Bld: 166 mg/dL — ABNORMAL HIGH (ref 65–99)
Potassium: 3.9 mmol/L (ref 3.5–5.1)
SODIUM: 137 mmol/L (ref 135–145)

## 2017-11-12 LAB — GLUCOSE, CAPILLARY
GLUCOSE-CAPILLARY: 119 mg/dL — AB (ref 65–99)
Glucose-Capillary: 159 mg/dL — ABNORMAL HIGH (ref 65–99)
Glucose-Capillary: 156 mg/dL — ABNORMAL HIGH (ref 65–99)
Glucose-Capillary: 137 mg/dL — ABNORMAL HIGH (ref 65–99)

## 2017-11-12 NOTE — Progress Notes (Signed)
Nutrition Follow-up  DOCUMENTATION CODES:   Not applicable  INTERVENTION:    Continue 30 mL Prostat BID, each supplement provides 100 kcal and 15 grams of protein.  Continue Juven packet BID to support wound healing  NUTRITION DIAGNOSIS:   Increased nutrient needs related to wound healing as evidenced by estimated needs.  Ongoing   GOAL:   Patient will meet greater than or equal to 90% of their needs  Met via oral and supplement intake  MONITOR:   PO intake, Supplement acceptance, Labs, Weight trends, I & O's, Skin  ASSESSMENT:   81 y/o female PMHx DM2, Aortic Stenosis, Nonischemic cardiomyopathy, chronic venous stasis ulcers, HLD, HTN, CAD. Presented for evaluation of R leg wound after being seen at wound clinic and found to have infection. Wound has been presented for many weeks. Admitted to hospital for management and RD consulted for wound healing.   Spoke with pt at bedside. Pt reports and chart reveals an upward trend in weight since last RD visit. Pt states she has been consuming the nutritional supplements ordered. Per chart pt is consuming 100% of meals at this time.  Pt endorses experiencing constipation for the first time since admission this AM.   Labs reviewed; CBG 125-222, Hemoglobin 9.6 Medications reviewed; Colace, ferrous sulfate, sliding scale insulin  Diet Order:  Diet Carb Modified Fluid consistency: Thin; Room service appropriate? Yes Diet NPO time specified Except for: Sips with Meds  EDUCATION NEEDS:   Not appropriate for education at this time  Skin:  Skin Assessment: Skin Integrity Issues: Skin Integrity Issues:: Diabetic Ulcer Diabetic Ulcer: RLE, wound on LLE  Last BM:  11/08/17  Height:   Ht Readings from Last 1 Encounters:  11/09/17 _0  (1.651 m)    Weight:   Wt Readings from Last 1 Encounters:  11/12/17 162 lb 14.7 oz (73.9 kg)    Ideal Body Weight:  56.82 kg  BMI:  Body mass index is 27.11 kg/m.  Estimated  Nutritional Needs:   Kcal:  1800-2000 kcals  Protein:  85-95 grams  Fluid:  1.8-2 L  Parks Ranger, MS, RDN, LDN 11/12/2017 1:55 PM

## 2017-11-12 NOTE — Care Management Note (Signed)
Case Management Note  Patient Details  Name: Emily Wall MRN: [id number] Date of Birth: Apr 17, 1936  Subjective/Objective:   81 yr old female s/p I & D with skin grafting and placement of wound vac to right achilles tendon and posterior calf ulcer.                   Action/Plan: Case manager spoke with patient concerning discharge plan. Patient was setup with [place] by previous Case Manager. CM called to confirm. Patient says she lives with her daughter Emily Wall and will have support at discharge.  .   Expected Discharge Date:   pending               Expected Discharge Plan:  [place]  In-House Referral:  NA  Discharge planning Services  CM Consult  Post Acute Care Choice:  Home Health Choice offered to:  Patient  DME Arranged:  N/A(has rollator) DME Agency:     HH Arranged:  PT, RN [place] Agency:  [place]  Status of Service:  In process, will continue to follow  If discussed at Long Length of Stay Meetings, dates discussed:    Additional Comments:  Ninfa Meeker, RN 11/12/2017, 3:32 PM

## 2017-11-12 NOTE — Progress Notes (Signed)
Physical Therapy Treatment Patient Details Name: Emily Wall MRN: [id number] DOB: [birth year] Today's Date: 11/12/2017    History of Present Illness Pt is a 81 y.o. female with medical history significant of DM type 2, NICM, aortic stenosis, and chronic venous stasis ulcers. She presented at the advisement of the wound care center for a right leg wound infection.  Pt underwent I&D, skin grafting and vac placement 11-09-17.     PT Comments    Patient is progressing toward mobility goals. Pt tolerated increased gait distance and able to safely negotiate steps this session. Pt reported less pain today. Current plan remains appropriate.    Follow Up Recommendations  Home health PT     Equipment Recommendations  None recommended by PT    Recommendations for Other Services       Precautions / Restrictions Precautions Precautions: None Restrictions Weight Bearing Restrictions: Yes RLE Weight Bearing: Weight bearing as tolerated    Mobility  Bed Mobility               General bed mobility comments: pt OOB in chair upon arrival  Transfers Overall transfer level: Needs assistance Equipment used: Rolling walker (2 wheeled) Transfers: Sit to/from Stand Sit to Stand: Min guard         General transfer comment: carry over of safe hand placement; min guard for safety  Ambulation/Gait Ambulation/Gait assistance: Min guard Ambulation Distance (Feet): 120 Feet Assistive device: Rolling walker (2 wheeled) Gait Pattern/deviations: Antalgic;Decreased stance time - right;Decreased step length - left;Decreased weight shift to right Gait velocity: decreased   General Gait Details: cues for posture and sequencing   Stairs Stairs: Yes   Stair Management: Two rails;Step to pattern;Forwards Number of Stairs: 3 General stair comments: cues for sequencing  Wheelchair Mobility    Modified Rankin (Stroke Patients Only)       Balance Overall balance assessment: No  apparent balance deficits (not formally assessed)                                          Cognition Arousal/Alertness: Awake/alert Behavior During Therapy: WFL for tasks assessed/performed Overall Cognitive Status: Within Functional Limits for tasks assessed                                        Exercises      General Comments        Pertinent Vitals/Pain Pain Assessment: Faces Faces Pain Scale: Hurts a little bit Pain Location: R foot Pain Descriptors / Indicators: Sore Pain Intervention(s): Monitored during session    Home Living                      Prior Function            PT Goals (current goals can now be found in the care plan section) Acute Rehab PT Goals Patient Stated Goal: home PT Goal Formulation: With patient Time For Goal Achievement: 11/17/17 Potential to Achieve Goals: Good Progress towards PT goals: Progressing toward goals    Frequency    Min 3X/week      PT Plan Current plan remains appropriate    Co-evaluation              AM-PAC PT "6 Clicks" Daily Activity  Outcome Measure  Difficulty  turning over in bed (including adjusting bedclothes, sheets and blankets)?: None Difficulty moving from lying on back to sitting on the side of the bed? : A Little Difficulty sitting down on and standing up from a chair with arms (e.g., wheelchair, bedside commode, etc,.)?: Unable Help needed moving to and from a bed to chair (including a wheelchair)?: A Little Help needed walking in hospital room?: A Little Help needed climbing 3-5 steps with a railing? : A Little 6 Click Score: 17    End of Session Equipment Utilized During Treatment: Gait belt Activity Tolerance: Patient tolerated treatment well Patient left: in chair;with call bell/phone within reach;Other (comment)(bilat LE elevated) Nurse Communication: Mobility status;Weight bearing status PT Visit Diagnosis: Difficulty in walking, not  elsewhere classified (R26.2)     Time: 6568-1275 PT Time Calculation (min) (ACUTE ONLY): 23 min  Charges:  $Gait Training: 8-22 mins $Therapeutic Activity: 8-22 mins                    G Codes:       Earney Navy, PTA Pager: [phone]     Darliss Cheney 11/12/2017, 11:37 AM

## 2017-11-12 NOTE — Progress Notes (Signed)
PROGRESS NOTE    Emily Wall  ALP:[id number] DOB: 25-Dec-1936 DOA: 11/02/2017 PCP: Chevis Pretty, FNP     Brief Narrative:  Emily Wall is a 81 year old female with history of diabetes mellitus, non-ischemic cardiomyopathy, aortic stenosis, chronic venous stasis, hypertension, dyslipidemia who presented to ED from rehab center for right leg wound infection. Patient notes that she has ulcerations present on both lower extremities, but the right leg specifically had become red and swollen with drainage over the last 3 weeks.  She complains of associated pain, but has been taking hydrocodone with adequate relief.  She had been started on ciprofloxacin approximately 1 week ago and reports that the lower extremity redness and swelling had improved.  She was seen in wound care clinic in [place] and because of the severity of the wounds advised her to come in for further evaluation.  Patient notes that she previously had skin grafts of the right lower extremity due to these venous stasis ulcers.  Patient was started on empiric vancomycin, cefepime, Flagyl on admission.  She was evaluated by orthopedic surgery Dr. Sharol Given and underwent debridement of the right Achilles tendon and posterior calf necrotic ulcer with wound VAC placement.  Assessment & Plan:   Principal Problem:   Wound infection Active Problems:   Essential hypertension   Aortic valve disorder   Diabetes mellitus (HCC)   Hypoalbuminemia   Venous stasis ulcer (HCC)   Diabetic leg ulcer ([place])   Atherosclerosis of native arteries of extremities with gangrene, right leg (HCC)   Right lower leg nonhealing, gangrenous wound infection, chronic venous stasis - Status post debridement of the right posterior calf necrotic ulcer and achilles tendon with application of split-thickness skin graft and wound vac with Dr. Sharol Given on 11/16.  Wound VAC in place for 1 more week  - Empirically treated with vancomycin, cefepime, Flagyl. No  cultures available. Patient VSS, WBC normal, and overall improving well. Spoke with Dr. Tommy Medal with ID (curbside consult) for antibiotic recommendation. Recommend Augmentin for 10 days post-debridement (last day tx 11/26).  - ABI unable to be completed on the right due to pain, ABI on left incomplete due to noncompressible vessels - Continue aspirin and statin therapy  - Vascular surgery consulted, planning for aortogram, bilateral leg runoff, possible RLE intervention 11/20 with Dr. Trula Slade   Anemia of chronic disease - Hgb stable  Chronic combinedsystolic and diastolic CHF / Aortic Stenosis - Last EF reported 45% with grade 2 DD and moderate aortic stenosis in 05/2017 - Continue coreg, lisinopril  - Stable   Moderate protein calorie malnutrition / Hypoalbuminemia  - Diet as tolerated   Diabetes mellitus with peripheral circularity complications with long term insulin use - Continue SSI - A1c 8.1  Essential hypertension - Continue coreg, lisinopril   Dyslipidemia associated with type 2 DM - Continue Lipitor   DVT prophylaxis: lovenox Code Status: full Family Communication: daughter at bedside Disposition Plan: pending improvement    Consultants:   Orthopedic surgery Dr. Sharol Given  Vascular surgery   Procedures:   Right leg debridement of achilles and posterior calf necrotic ulcer, split thickness skin graft, wound vac placement 11/09/2017   Antimicrobials:  Anti-infectives (From admission, onward)   Start     Dose/Rate Route Frequency Ordered Stop   11/11/17 1300  amoxicillin-clavulanate (AUGMENTIN) 875-125 MG per tablet 1 tablet     1 tablet Oral Every 12 hours 11/11/17 1238     11/09/17 1100  ceFAZolin (ANCEF) IVPB 2g/100 mL premix  2 g 200 mL/hr over 30 Minutes Intravenous To [facility] 11/08/17 1242 11/09/17 1035   11/03/17 2200  vancomycin (VANCOCIN) 1,250 mg in sodium chloride 0.9 % 250 mL IVPB  Status:  Discontinued     1,250 mg 166.7 mL/hr  over 90 Minutes Intravenous Every 24 hours 11/03/17 0110 11/11/17 1238   11/03/17 1400  ceFEPIme (MAXIPIME) 1 g in dextrose 5 % 50 mL IVPB  Status:  Discontinued     1 g 100 mL/hr over 30 Minutes Intravenous Every 8 hours 11/03/17 0145 11/11/17 1238   11/03/17 0200  metroNIDAZOLE (FLAGYL) IVPB 500 mg  Status:  Discontinued     500 mg 100 mL/hr over 60 Minutes Intravenous Every 8 hours 11/03/17 0137 11/11/17 1238   11/03/17 0200  ceFEPIme (MAXIPIME) 2 g in dextrose 5 % 50 mL IVPB     2 g 100 mL/hr over 30 Minutes Intravenous  Once 11/03/17 0146 11/03/17 0503   11/03/17 0000  vancomycin (VANCOCIN) IVPB 1000 mg/200 mL premix     1,000 mg 200 mL/hr over 60 Minutes Intravenous  Once 11/02/17 2355 11/03/17 0216       Subjective: Feeling well today.  No physical complaints.  No acute events overnight.    Objective: Vitals:   11/11/17 1936 11/12/17 0252 11/12/17 0700 11/12/17 0922  BP: 138/72 (!) 152/85  (!) 152/85  Pulse: 76 82    Resp:      Temp: 98.2 F (36.8 C) 98.5 F (36.9 C)    TempSrc: Oral Oral    SpO2: 99% 100%    Weight:   73.9 kg (162 lb 14.7 oz)   Height:        Intake/Output Summary (Last 24 hours) at 11/12/2017 1222 Last data filed at 11/12/2017 0900 Gross per 24 hour  Intake 240 ml  Output 50 ml  Net 190 ml   Filed Weights   11/09/17 0934 11/10/17 2346 11/12/17 0700  Weight: 71.7 kg (158 lb) 74.1 kg (163 lb 5.8 oz) 73.9 kg (162 lb 14.7 oz)    Examination:  General exam: Appears calm and comfortable  Respiratory system: Clear to auscultation. Respiratory effort normal. Cardiovascular system: S1 & S2 heard. No JVD, murmurs, rubs, gallops or clicks. +1 edema  Gastrointestinal system: Abdomen is nondistended, soft and nontender. No organomegaly or masses felt. Normal bowel sounds heard. Central nervous system: Alert and oriented. No focal neurological deficits. Extremities: Symmetric  Skin: +wound vac on right calf  Psychiatry: Judgement and insight appear  normal. Mood & affect appropriate.   Data Reviewed: I have personally reviewed following labs and imaging studies  CBC: Recent Labs  Lab 11/06/17 0827 11/07/17 0415 11/08/17 0708 11/11/17 0422 11/12/17 0535  WBC 5.0 4.6 3.3* 4.5 4.6  NEUTROABS 3.4 2.9  --   --   --   HGB 11.0* 9.7* 9.6* 9.2* 9.6*  HCT 33.0* 29.8* 30.3* 28.9* 29.1*  MCV 93.0 92.5 92.9 92.6 92.4  PLT 245 222 214 219 353   Basic Metabolic Panel: Recent Labs  Lab 11/06/17 0827 11/07/17 0415 11/08/17 0708 11/11/17 0422 11/12/17 0535  NA 131* 134* 135 136 137  K 4.0 3.5 3.6 4.1 3.9  CL 98* 102 103 106 105  CO2 26 27 28 24 27   GLUCOSE 200* 135* 209* 132* 166*  BUN 14 17 16 16 16   CREATININE 0.63 0.66 0.58 0.57 0.55  CALCIUM 8.0* 8.0* 8.0* 8.3* 8.1*  MG 1.6* 1.9  --   --   --  PHOS 2.5 2.9  --   --   --    GFR: Estimated Creatinine Clearance: 58.4 mL/min (by C-G formula based on SCr of 0.55 mg/dL). Liver Function Tests: Recent Labs  Lab 11/06/17 0827 11/07/17 0415  AST 23 20  ALT 14 13*  ALKPHOS 50 44  BILITOT 0.7 0.6  PROT 6.7 6.1*  ALBUMIN 2.6* 2.4*   No results for input(s): LIPASE, AMYLASE in the last 168 hours. No results for input(s): AMMONIA in the last 168 hours. Coagulation Profile: No results for input(s): INR, PROTIME in the last 168 hours. Cardiac Enzymes: No results for input(s): CKTOTAL, CKMB, CKMBINDEX, TROPONINI in the last 168 hours. BNP (last 3 results) No results for input(s): PROBNP in the last 8760 hours. HbA1C: No results for input(s): HGBA1C in the last 72 hours. CBG: Recent Labs  Lab 11/11/17 1212 11/11/17 1618 11/11/17 2227 11/12/17 0718 11/12/17 1141  GLUCAP 161* 125* 164* 159* 156*   Lipid Profile: No results for input(s): CHOL, HDL, LDLCALC, TRIG, CHOLHDL, LDLDIRECT in the last 72 hours. Thyroid Function Tests: No results for input(s): TSH, T4TOTAL, FREET4, T3FREE, THYROIDAB in the last 72 hours. Anemia Panel: No results for input(s): VITAMINB12,  FOLATE, FERRITIN, TIBC, IRON, RETICCTPCT in the last 72 hours. Sepsis Labs: No results for input(s): PROCALCITON, LATICACIDVEN in the last 168 hours.  Recent Results (from the past 240 hour(s))  Blood Cultures x 2 sites     Status: None   Collection Time: 11/03/17  1:10 AM  Result Value Ref Range Status   Specimen Description BLOOD RIGHT ANTECUBITAL  Final   Special Requests   Final    BOTTLES DRAWN AEROBIC AND ANAEROBIC Blood Culture adequate volume   Culture NO GROWTH 5 DAYS  Final   Report Status 11/08/2017 FINAL  Final  Blood Cultures x 2 sites     Status: None   Collection Time: 11/03/17  1:15 AM  Result Value Ref Range Status   Specimen Description BLOOD LEFT ANTECUBITAL  Final   Special Requests   Final    BOTTLES DRAWN AEROBIC AND ANAEROBIC Blood Culture results may not be optimal due to an excessive volume of blood received in culture bottles   Culture NO GROWTH 5 DAYS  Final   Report Status 11/08/2017 FINAL  Final  MRSA PCR Screening     Status: Abnormal   Collection Time: 11/09/17  7:46 AM  Result Value Ref Range Status   MRSA by PCR POSITIVE (A) NEGATIVE Final    Comment:        The GeneXpert MRSA Assay (FDA approved for NASAL specimens only), is one component of a comprehensive MRSA colonization surveillance program. It is not intended to diagnose MRSA infection nor to guide or monitor treatment for MRSA infections. RESULT CALLED TO, READ BACK BY AND VERIFIED WITH: Z. Calwitan RN 9:45 11/09/17 ([email or web address])        Radiology Studies: No results found.    Scheduled Meds: . amoxicillin-clavulanate  1 tablet Oral Q12H  . aspirin  325 mg Oral Daily  . atorvastatin  20 mg Oral Daily  . carvedilol  25 mg Oral BID WC  . Chlorhexidine Gluconate Cloth  6 each Topical Q0600  . docusate sodium  100 mg Oral BID  . enoxaparin (LOVENOX) injection  40 mg Subcutaneous Q24H  . feeding supplement (PRO-STAT SUGAR FREE 64)  30 mL Oral BID  . ferrous sulfate  325 mg Oral Q  breakfast  . insulin aspart  0-5 Units Subcutaneous QHS  .  insulin aspart  0-9 Units Subcutaneous TID WC  . lisinopril  20 mg Oral Daily  . mupirocin ointment  1 application Nasal BID  . nutrition supplement (JUVEN)  1 packet Oral BID BM   Continuous Infusions: . sodium chloride 10 mL/hr at 11/09/17 1253  . lactated ringers 10 mL/hr at 11/09/17 0942  . methocarbamol (ROBAXIN)  IV       LOS: 9 days    Time spent: 30 minutes   Dessa Phi, DO [facility] [email or web address] Password [email or web address] 11/12/2017, 12:22 PM

## 2017-11-13 ENCOUNTER — Encounter (HOSPITAL_COMMUNITY)
Admission: EM | Disposition: A | Discharge status: Home Health | Payer: Self-pay | Source: Home / Self Care | Attending: Internal Medicine

## 2017-11-13 HISTORY — PX: LOWER EXTREMITY ANGIOGRAPHY: CATH118251

## 2017-11-13 LAB — BASIC METABOLIC PANEL
ANION GAP: 6 (ref 5–15)
BUN: 12 mg/dL (ref 6–20)
CHLORIDE: 102 mmol/L (ref 101–111)
CO2: 28 mmol/L (ref 22–32)
Calcium: 8.1 mg/dL — ABNORMAL LOW (ref 8.9–10.3)
Creatinine, Ser: 0.56 mg/dL (ref 0.44–1.00)
GFR calc Af Amer: >60 mL/min (ref >60)
GLUCOSE: 140 mg/dL — AB (ref 65–99)
POTASSIUM: 3.6 mmol/L (ref 3.5–5.1)
Sodium: 136 mmol/L (ref 135–145)

## 2017-11-13 LAB — GLUCOSE, CAPILLARY
GLUCOSE-CAPILLARY: 134 mg/dL — AB (ref 65–99)
GLUCOSE-CAPILLARY: 197 mg/dL — AB (ref 65–99)
Glucose-Capillary: 117 mg/dL — ABNORMAL HIGH (ref 65–99)
Glucose-Capillary: 126 mg/dL — ABNORMAL HIGH (ref 65–99)

## 2017-11-13 SURGERY — LOWER EXTREMITY ANGIOGRAPHY
Anesthesia: LOCAL

## 2017-11-13 MED ORDER — FENTANYL CITRATE (PF) 100 MCG/2ML IJ SOLN
INTRAMUSCULAR | Status: AC
  Filled 2017-11-13: qty 2

## 2017-11-13 MED ORDER — SODIUM CHLORIDE 0.9% FLUSH
3.0000 mL | Freq: Two times a day (BID) | INTRAVENOUS | Status: DC
  Administered 2017-11-14: 3 mL via INTRAVENOUS

## 2017-11-13 MED ORDER — ONDANSETRON HCL 4 MG/2ML IJ SOLN: 4.0000 mg | Freq: Four times a day (QID) | INTRAMUSCULAR | Status: DC | PRN

## 2017-11-13 MED ORDER — HEPARIN (PORCINE) IN NACL 2-0.9 UNIT/ML-% IJ SOLN
INTRAMUSCULAR | Status: AC
  Filled 2017-11-13: qty 1000

## 2017-11-13 MED ORDER — SODIUM CHLORIDE 0.9 % IV SOLN: 250.0000 mL | INTRAVENOUS | Status: DC | PRN

## 2017-11-13 MED ORDER — ACETAMINOPHEN 325 MG PO TABS: 650.0000 mg | ORAL_TABLET | ORAL | Status: DC | PRN

## 2017-11-13 MED ORDER — HEPARIN (PORCINE) IN NACL 2-0.9 UNIT/ML-% IJ SOLN
INTRAMUSCULAR | Status: AC | PRN
  Administered 2017-11-13: 1000 mL via INTRA_ARTERIAL

## 2017-11-13 MED ORDER — MORPHINE SULFATE (PF) 2 MG/ML IV SOLN: 2.0000 mg | INTRAVENOUS | Status: DC | PRN

## 2017-11-13 MED ORDER — OXYCODONE HCL 5 MG PO TABS
5.0000 mg | ORAL_TABLET | ORAL | Status: DC | PRN
  Administered 2017-11-13: 10 mg via ORAL
  Filled 2017-11-13: qty 2

## 2017-11-13 MED ORDER — SODIUM CHLORIDE 0.9 % IV SOLN
INTRAVENOUS | Status: DC
Start: 2017-11-13 — End: 2017-11-14
  Administered 2017-11-13: 09:00:00 via INTRAVENOUS

## 2017-11-13 MED ORDER — IODIXANOL 320 MG/ML IV SOLN
INTRAVENOUS | Status: DC | PRN
  Administered 2017-11-13: 110 mL via INTRA_ARTERIAL

## 2017-11-13 MED ORDER — MIDAZOLAM HCL 2 MG/2ML IJ SOLN
INTRAMUSCULAR | Status: AC
  Filled 2017-11-13: qty 2

## 2017-11-13 MED ORDER — MIDAZOLAM HCL 2 MG/2ML IJ SOLN
INTRAMUSCULAR | Status: DC | PRN
  Administered 2017-11-13: 1 mg via INTRAVENOUS

## 2017-11-13 MED ORDER — SODIUM CHLORIDE 0.9 % WEIGHT BASED INFUSION: 1.0000 mL/kg/h | INTRAVENOUS | Status: AC

## 2017-11-13 MED ORDER — LABETALOL HCL 5 MG/ML IV SOLN: 10.0000 mg | INTRAVENOUS | Status: DC | PRN

## 2017-11-13 MED ORDER — LIDOCAINE HCL (PF) 1 % IJ SOLN
INTRAMUSCULAR | Status: AC
  Filled 2017-11-13: qty 30

## 2017-11-13 MED ORDER — LIDOCAINE HCL (PF) 1 % IJ SOLN
INTRAMUSCULAR | Status: DC | PRN
  Administered 2017-11-13: 15 mL

## 2017-11-13 MED ORDER — HYDRALAZINE HCL 20 MG/ML IJ SOLN: 5.0000 mg | INTRAMUSCULAR | Status: DC | PRN

## 2017-11-13 MED ORDER — SODIUM CHLORIDE 0.9% FLUSH: 3.0000 mL | INTRAVENOUS | Status: DC | PRN

## 2017-11-13 MED ORDER — FENTANYL CITRATE (PF) 100 MCG/2ML IJ SOLN
INTRAMUSCULAR | Status: DC | PRN
  Administered 2017-11-13: 50 ug via INTRAVENOUS

## 2017-11-13 SURGICAL SUPPLY — 14 items
CATH ANGIO 5F BER2 65CM (CATHETERS) ×2 IMPLANT
CATH CROSS OVER TEMPO 5F (CATHETERS) ×2 IMPLANT
CATH OMNI FLUSH 5F 65CM (CATHETERS) ×2 IMPLANT
CATH SOFT-VU 4F 65 STRAIGHT (CATHETERS) ×1 IMPLANT
CATH SOFT-VU STRAIGHT 4F 65CM (CATHETERS) ×2
COVER PRB 48X5XTLSCP FOLD TPE (BAG) ×1 IMPLANT
COVER PROBE 5X48 (BAG) ×2
KIT MICROINTRODUCER STIFF 5F (SHEATH) ×2 IMPLANT
KIT PV (KITS) ×2 IMPLANT
SHEATH PINNACLE 5F 10CM (SHEATH) ×2 IMPLANT
SYR MEDRAD MARK V 150ML (SYRINGE) ×2 IMPLANT
TRANSDUCER W/STOPCOCK (MISCELLANEOUS) ×2 IMPLANT
TRAY PV CATH (CUSTOM PROCEDURE TRAY) ×2 IMPLANT
WIRE BENTSON .035X145CM (WIRE) ×2 IMPLANT

## 2017-11-13 NOTE — Consult Note (Signed)
[facility] Nurse wound follow up Wound type: mixed etiology, full thickness ulceration LLE.  RLE has had graft and has NWPT in place, Dr. Sharol Given managing site.  Dressing to not be change x 1 week.  Measurement:7 small openings over the medial malleolar region of the left LE.  2cm x 1cm average.  Wound bed: all 100% yellow, after cleansing with saline  Drainage (amount, consistency, odor) minimal, thick yellow, no odor Periwound: intact, venous skin changes  Dressing procedure/placement/frequency: Removed old Profore compression wrap and dressings.  Leg washed with soap and water.  Lotion applied to leg and foot (not ulcers). Hydrogel applied directly to the ulcerations. Covered with dry dressing.  4 layer compression wrap applied. Patient tolerated well. Will plan to change again on Friday if still inpatient.   Will need 4 layer compression wrap changed on Friday in MD office or by [facility] if DC to home.  [place], [place], [place]

## 2017-11-13 NOTE — Op Note (Signed)
    Patient name: Emily Wall MRN: [id number] DOB: 1936/09/01 Sex: female  11/13/2017 Pre-operative Diagnosis: Right leg ulcer Post-operative diagnosis:  Same Surgeon:  Annamarie Major Procedure Performed:  1.  Ultrasound-guided access, left femoral artery  2.  Abdominal aortogram  3.  Bilateral lower extremity runoff  4.  Second-order catheterization  5.  Conscious sedation (15 minutes)     Indications: Patient has a nonhealing wound.  She is here today for further evaluation  Procedure:  The patient was identified in the holding area and taken to room 8.  The patient was then placed supine on the table and prepped and draped in the usual sterile fashion.  A time out was called.  Conscious sedation was administered with the use of IV fentanyl and Versed under continuous physician and nurse monitoring.  Heart rate, blood pressure, and oxygen saturations were continuously monitored.  Ultrasound was used to evaluate the left common femoral artery.  It was patent .  A digital ultrasound image was acquired.  A micropuncture needle was used to access the left common femoral artery under ultrasound guidance.  An 018 wire was advanced without resistance and a micropuncture sheath was placed.  The 018 wire was removed and a benson wire was placed.  The micropuncture sheath was exchanged for a 5 french sheath.  An omniflush catheter was advanced over the wire to the level of L-1.  An abdominal angiogram was obtained.  Next, using the omniflush catheter and a benson wire, the aortic bifurcation was crossed and the catheter was placed into theright external iliac artery and right runoff was obtained.  left runoff was performed via retrograde sheath injections.  Findings:   Aortogram: There is approximately a 60-70% right renal artery stenosis.  Left renal artery is widely patent.  The infrarenal abdominal aorta is patent without significant stenosis but calcified.  Bilateral common and external iliac  arteries are patent with calcification and no hemodynamically significant stenosis.T  Right Lower Extremity: The right common femoral and profunda femoral artery are patent throughout their course.  The superficial femoral artery is calcified but patent as is the popliteal artery.  The dominant runoff vessel is theperoneal artery which  collateralizes to the posterior tibial.  Is to the patient  Left Lower Extremity: The left common femoral profunda femoral and superficial femoral artery are patent but calcified.  Popliteal artery is patent throughout its course.  There is two-vessel runoff via the anterior tibial and peroneal artery  Intervention: None  Impression:  #1  Single-vessel runoff via the peroneal artery on the right without hemodynamically significant stenosis.  #2  Heavily calcified aortoiliac vasculature with no significant stenosis  #3  Bilateral superficial femoral and popliteal arteries are widely patent but calcified     V. Annamarie Major, M.D. [facility] Office: [phone] Pager:  [phone]

## 2017-11-13 NOTE — Plan of Care (Signed)
  Clinical Measurements: Ability to maintain clinical measurements within normal limits will improve 11/13/2017 1215 - Progressing by Williams Che, RN   Activity: Risk for activity intolerance will decrease 11/13/2017 1215 - Progressing by Williams Che, RN   Nutrition: Adequate nutrition will be maintained 11/13/2017 1215 - Progressing by Williams Che, RN   Coping: Level of anxiety will decrease 11/13/2017 1215 - Progressing by Williams Che, RN   Pain Managment: General experience of comfort will improve 11/13/2017 1215 - Progressing by Williams Che, RN

## 2017-11-13 NOTE — Progress Notes (Signed)
Physical Therapy Treatment Patient Details Name: Emily Wall MRN: [id number] DOB: [birth year] Today's Date: 11/13/2017    History of Present Illness Pt is a 81 y.o. female with medical history significant of DM type 2, NICM, aortic stenosis, and chronic venous stasis ulcers. She presented at the advisement of the wound care center for a right leg wound infection.  Pt underwent I&D, skin grafting and vac placement 11-09-17.     PT Comments    Pt continues to increase ambulation distance. Today's skilled session focused on improving gait quality by emphasizing heel strike, upright posture, and increased stride length. She would benefit from continued skilled PT to increase activity tolerance and functional independence. Will continue to follow.   Follow Up Recommendations  Home health PT     Equipment Recommendations  None recommended by PT    Recommendations for Other Services       Precautions / Restrictions Precautions Precautions: None Restrictions Weight Bearing Restrictions: Yes RLE Weight Bearing: Weight bearing as tolerated LLE Weight Bearing: Weight bearing as tolerated    Mobility  Bed Mobility Overal bed mobility: Modified Independent             General bed mobility comments: pt OOB in chair upon arrival  Transfers Overall transfer level: Needs assistance Equipment used: Rolling walker (2 wheeled) Transfers: Sit to/from Stand Sit to Stand: Min guard         General transfer comment: carry over of safe hand placement; min guard for safety  Ambulation/Gait Ambulation/Gait assistance: Min guard Ambulation Distance (Feet): 150 Feet Assistive device: Rolling walker (2 wheeled) Gait Pattern/deviations: Antalgic;Decreased stance time - right;Decreased step length - left;Decreased weight shift to right;Trunk flexed Gait velocity: decreased   General Gait Details: cues for posture, heel strike and increased stride length   Stairs             Wheelchair Mobility    Modified Rankin (Stroke Patients Only)       Balance Overall balance assessment: No apparent balance deficits (not formally assessed)                                          Cognition Arousal/Alertness: Awake/alert Behavior During Therapy: WFL for tasks assessed/performed Overall Cognitive Status: Within Functional Limits for tasks assessed                                        Exercises      General Comments General comments (skin integrity, edema, etc.): Pt sat EOB for ~8 min for gown change.       Pertinent Vitals/Pain Pain Assessment: No/denies pain Pain Intervention(s): Monitored during session    Home Living                      Prior Function            PT Goals (current goals can now be found in the care plan section) Acute Rehab PT Goals Patient Stated Goal: home PT Goal Formulation: With patient Time For Goal Achievement: 11/17/17 Potential to Achieve Goals: Good Progress towards PT goals: Progressing toward goals    Frequency    Min 3X/week      PT Plan Current plan remains appropriate    Co-evaluation  AM-PAC PT "6 Clicks" Daily Activity  Outcome Measure  Difficulty turning over in bed (including adjusting bedclothes, sheets and blankets)?: None Difficulty moving from lying on back to sitting on the side of the bed? : None Difficulty sitting down on and standing up from a chair with arms (e.g., wheelchair, bedside commode, etc,.)?: Unable Help needed moving to and from a bed to chair (including a wheelchair)?: A Little Help needed walking in hospital room?: A Little Help needed climbing 3-5 steps with a railing? : A Little 6 Click Score: 18    End of Session Equipment Utilized During Treatment: Gait belt Activity Tolerance: Patient tolerated treatment well Patient left: in chair;with call bell/phone within reach;Other (comment);with family/visitor  present(bilat LE elevated) Nurse Communication: Mobility status;Weight bearing status PT Visit Diagnosis: Difficulty in walking, not elsewhere classified (R26.2)     Time: 4599-7741 PT Time Calculation (min) (ACUTE ONLY): 28 min  Charges:  $Gait Training: 8-22 mins $Therapeutic Activity: 8-22 mins                    G Codes:      Benjiman Core, [place] Pager [phone] Acute Rehab   Allena Katz 11/13/2017, 11:02 AM

## 2017-11-13 NOTE — Interval H&P Note (Signed)
History and Physical Interval Note:  11/13/2017 1:52 PM  Emily Wall  has presented today for surgery, with the diagnosis of pvd w/ grangren right leg  The various methods of treatment have been discussed with the patient and family. After consideration of risks, benefits and other options for treatment, the patient has consented to  Procedure(s): LOWER EXTREMITY ANGIOGRAPHY (N/A) as a surgical intervention .  The patient's history has been reviewed, patient examined, no change in status, stable for surgery.  I have reviewed the patient's chart and labs.  Questions were answered to the patient's satisfaction.     Annamarie Major

## 2017-11-13 NOTE — Progress Notes (Signed)
PROGRESS NOTE    Emily Wall  SAY:[id number] DOB: Apr 28, 1936 DOA: 11/02/2017 PCP: Chevis Pretty, FNP     Brief Narrative:  Emily Wall is a 81 year old female with history of diabetes mellitus, non-ischemic cardiomyopathy, aortic stenosis, chronic venous stasis, hypertension, dyslipidemia who presented to ED from rehab center for right leg wound infection. Patient notes that she has ulcerations present on both lower extremities, but the right leg specifically had become red and swollen with drainage over the last 3 weeks.  She complains of associated pain, but has been taking hydrocodone with adequate relief.  She had been started on ciprofloxacin approximately 1 week ago and reports that the lower extremity redness and swelling had improved.  She was seen in wound care clinic in [place] and because of the severity of the wounds advised her to come in for further evaluation.  Patient notes that she previously had skin grafts of the right lower extremity due to these venous stasis ulcers.  Patient was started on empiric vancomycin, cefepime, Flagyl on admission.  She was evaluated by orthopedic surgery Dr. Sharol Given and underwent debridement of the right Achilles tendon and posterior calf necrotic ulcer with wound VAC placement.  Assessment & Plan:   Principal Problem:   Wound infection Active Problems:   Essential hypertension   Aortic valve disorder   Diabetes mellitus (HCC)   Hypoalbuminemia   Venous stasis ulcer (HCC)   Diabetic leg ulcer ([place])   Atherosclerosis of native arteries of extremities with gangrene, right leg (HCC)   Right lower leg nonhealing, gangrenous wound infection, chronic venous stasis - Status post debridement of the right posterior calf necrotic ulcer and achilles tendon with application of split-thickness skin graft and wound vac with Dr. Sharol Given on 11/16.  Wound VAC in place for 1 more week  - Empirically treated with vancomycin, cefepime, Flagyl. No  cultures available. Patient VSS, WBC normal, and overall improving well. Spoke with Dr. Tommy Medal with ID (curbside consult) for antibiotic recommendation. Recommend Augmentin for 10 days post-debridement (last day tx 11/26).  - ABI unable to be completed on the right due to pain, ABI on left incomplete due to noncompressible vessels - Continue aspirin and statin therapy  - Vascular surgery consulted, planning for aortogram, bilateral leg runoff, possible RLE intervention 11/20 with Dr. Trula Slade   Anemia of chronic disease - Hgb stable  Chronic combinedsystolic and diastolic CHF / Aortic Stenosis - Last EF reported 45% with grade 2 DD and moderate aortic stenosis in 05/2017 - Continue coreg, lisinopril  - Stable   Moderate protein calorie malnutrition / Hypoalbuminemia  - Diet as tolerated   Diabetes mellitus with peripheral circularity complications with long term insulin use - Continue SSI - A1c 8.1  Essential hypertension - Continue coreg, lisinopril   Dyslipidemia associated with type 2 DM - Continue Lipitor   DVT prophylaxis: lovenox Code Status: full Family Communication: no family at bedside Disposition Plan: home with [facility] when stable   Consultants:   Orthopedic surgery Dr. Sharol Given  Vascular surgery   Procedures:   Right leg debridement of achilles and posterior calf necrotic ulcer, split thickness skin graft, wound vac placement 11/09/2017   Antimicrobials:  Anti-infectives (From admission, onward)   Start     Dose/Rate Route Frequency Ordered Stop   11/11/17 1300  amoxicillin-clavulanate (AUGMENTIN) 875-125 MG per tablet 1 tablet     1 tablet Oral Every 12 hours 11/11/17 1238     11/09/17 1100  ceFAZolin (ANCEF) IVPB 2g/100 mL  premix     2 g 200 mL/hr over 30 Minutes Intravenous To [facility] 11/08/17 1242 11/09/17 1035   11/03/17 2200  vancomycin (VANCOCIN) 1,250 mg in sodium chloride 0.9 % 250 mL IVPB  Status:  Discontinued     1,250 mg 166.7  mL/hr over 90 Minutes Intravenous Every 24 hours 11/03/17 0110 11/11/17 1238   11/03/17 1400  ceFEPIme (MAXIPIME) 1 g in dextrose 5 % 50 mL IVPB  Status:  Discontinued     1 g 100 mL/hr over 30 Minutes Intravenous Every 8 hours 11/03/17 0145 11/11/17 1238   11/03/17 0200  metroNIDAZOLE (FLAGYL) IVPB 500 mg  Status:  Discontinued     500 mg 100 mL/hr over 60 Minutes Intravenous Every 8 hours 11/03/17 0137 11/11/17 1238   11/03/17 0200  ceFEPIme (MAXIPIME) 2 g in dextrose 5 % 50 mL IVPB     2 g 100 mL/hr over 30 Minutes Intravenous  Once 11/03/17 0146 11/03/17 0503   11/03/17 0000  vancomycin (VANCOCIN) IVPB 1000 mg/200 mL premix     1,000 mg 200 mL/hr over 60 Minutes Intravenous  Once 11/02/17 2355 11/03/17 0216       Subjective: Feeling well today.  No physical complaints.  No acute events overnight.    Objective: Vitals:   11/12/17 0922 11/12/17 1300 11/12/17 2023 11/13/17 0655  BP: (!) 152/85 (!) 132/55 (!) 118/53 140/77  Pulse:  81 83 82  Resp:  16 16 16   Temp:  98 F (36.7 C) 98.3 F (36.8 C) 97.9 F (36.6 C)  TempSrc:  Oral Oral Oral  SpO2:  98% 100% 100%  Weight:    74.8 kg (164 lb 14.5 oz)  Height:        Intake/Output Summary (Last 24 hours) at 11/13/2017 1315 Last data filed at 11/12/2017 1700 Gross per 24 hour  Intake 240 ml  Output 140 ml  Net 100 ml   Filed Weights   11/10/17 2346 11/12/17 0700 11/13/17 0655  Weight: 74.1 kg (163 lb 5.8 oz) 73.9 kg (162 lb 14.7 oz) 74.8 kg (164 lb 14.5 oz)    Examination:  General exam: Appears calm and comfortable  Respiratory system: Clear to auscultation. Respiratory effort normal. Cardiovascular system: S1 & S2 heard. No JVD, murmurs, rubs, gallops or clicks. +1 edema  Gastrointestinal system: Abdomen is nondistended, soft and nontender. No organomegaly or masses felt. Normal bowel sounds heard. Central nervous system: Alert and oriented. No focal neurological deficits. Extremities: Symmetric  Skin: +wound vac on  right calf  Psychiatry: Judgement and insight appear normal. Mood & affect appropriate.   Data Reviewed: I have personally reviewed following labs and imaging studies  CBC: Recent Labs  Lab 11/07/17 0415 11/08/17 0708 11/11/17 0422 11/12/17 0535  WBC 4.6 3.3* 4.5 4.6  NEUTROABS 2.9  --   --   --   HGB 9.7* 9.6* 9.2* 9.6*  HCT 29.8* 30.3* 28.9* 29.1*  MCV 92.5 92.9 92.6 92.4  PLT 222 214 219 132   Basic Metabolic Panel: Recent Labs  Lab 11/07/17 0415 11/08/17 0708 11/11/17 0422 11/12/17 0535 11/13/17 0430  NA 134* 135 136 137 136  K 3.5 3.6 4.1 3.9 3.6  CL 102 103 106 105 102  CO2 27 28 24 27 28   GLUCOSE 135* 209* 132* 166* 140*  BUN 17 16 16 16 12   CREATININE 0.66 0.58 0.57 0.55 0.56  CALCIUM 8.0* 8.0* 8.3* 8.1* 8.1*  MG 1.9  --   --   --   --  PHOS 2.9  --   --   --   --    GFR: Estimated Creatinine Clearance: 58.6 mL/min (by C-G formula based on SCr of 0.56 mg/dL). Liver Function Tests: Recent Labs  Lab 11/07/17 0415  AST 20  ALT 13*  ALKPHOS 44  BILITOT 0.6  PROT 6.1*  ALBUMIN 2.4*   No results for input(s): LIPASE, AMYLASE in the last 168 hours. No results for input(s): AMMONIA in the last 168 hours. Coagulation Profile: No results for input(s): INR, PROTIME in the last 168 hours. Cardiac Enzymes: No results for input(s): CKTOTAL, CKMB, CKMBINDEX, TROPONINI in the last 168 hours. BNP (last 3 results) No results for input(s): PROBNP in the last 8760 hours. HbA1C: No results for input(s): HGBA1C in the last 72 hours. CBG: Recent Labs  Lab 11/12/17 1141 11/12/17 1643 11/12/17 2026 11/13/17 0653 11/13/17 1145  GLUCAP 156* 137* 119* 134* 117*   Lipid Profile: No results for input(s): CHOL, HDL, LDLCALC, TRIG, CHOLHDL, LDLDIRECT in the last 72 hours. Thyroid Function Tests: No results for input(s): TSH, T4TOTAL, FREET4, T3FREE, THYROIDAB in the last 72 hours. Anemia Panel: No results for input(s): VITAMINB12, FOLATE, FERRITIN, TIBC, IRON,  RETICCTPCT in the last 72 hours. Sepsis Labs: No results for input(s): PROCALCITON, LATICACIDVEN in the last 168 hours.  Recent Results (from the past 240 hour(s))  MRSA PCR Screening     Status: Abnormal   Collection Time: 11/09/17  7:46 AM  Result Value Ref Range Status   MRSA by PCR POSITIVE (A) NEGATIVE Final    Comment:        The GeneXpert MRSA Assay (FDA approved for NASAL specimens only), is one component of a comprehensive MRSA colonization surveillance program. It is not intended to diagnose MRSA infection nor to guide or monitor treatment for MRSA infections. RESULT CALLED TO, READ BACK BY AND VERIFIED WITH: Z. Calwitan RN 9:45 11/09/17 ([email or web address])        Radiology Studies: No results found.    Scheduled Meds: . amoxicillin-clavulanate  1 tablet Oral Q12H  . aspirin  325 mg Oral Daily  . atorvastatin  20 mg Oral Daily  . carvedilol  25 mg Oral BID WC  . Chlorhexidine Gluconate Cloth  6 each Topical Q0600  . docusate sodium  100 mg Oral BID  . enoxaparin (LOVENOX) injection  40 mg Subcutaneous Q24H  . feeding supplement (PRO-STAT SUGAR FREE 64)  30 mL Oral BID  . ferrous sulfate  325 mg Oral Q breakfast  . insulin aspart  0-5 Units Subcutaneous QHS  . insulin aspart  0-9 Units Subcutaneous TID WC  . lisinopril  20 mg Oral Daily  . mupirocin ointment  1 application Nasal BID  . nutrition supplement (JUVEN)  1 packet Oral BID BM   Continuous Infusions: . sodium chloride 10 mL/hr at 11/09/17 1253  . sodium chloride 100 mL/hr at 11/13/17 0833  . lactated ringers 10 mL/hr at 11/09/17 0942  . methocarbamol (ROBAXIN)  IV       LOS: 10 days    Time spent: 30 minutes   Dessa Phi, DO [facility] [email or web address] Password [email or web address] 11/13/2017, 1:15 PM

## 2017-11-14 ENCOUNTER — Encounter (HOSPITAL_COMMUNITY): Payer: Medicare HMO | Admitting: Physical Therapy

## 2017-11-14 ENCOUNTER — Encounter (HOSPITAL_COMMUNITY): Payer: Self-pay | Admitting: Surgery

## 2017-11-14 LAB — BASIC METABOLIC PANEL
Anion gap: 4 — ABNORMAL LOW (ref 5–15)
BUN: 7 mg/dL (ref 6–20)
CALCIUM: 7.7 mg/dL — AB (ref 8.9–10.3)
CHLORIDE: 104 mmol/L (ref 101–111)
CO2: 26 mmol/L (ref 22–32)
CREATININE: 0.54 mg/dL (ref 0.44–1.00)
GFR calc Af Amer: >60 mL/min (ref >60)
GFR calc non Af Amer: >60 mL/min (ref >60)
Glucose, Bld: 186 mg/dL — ABNORMAL HIGH (ref 65–99)
Potassium: 3.6 mmol/L (ref 3.5–5.1)
SODIUM: 134 mmol/L — AB (ref 135–145)

## 2017-11-14 LAB — GLUCOSE, CAPILLARY: GLUCOSE-CAPILLARY: 160 mg/dL — AB (ref 65–99)

## 2017-11-14 MED ORDER — OXYCODONE HCL 5 MG PO TABS: 5.0000 mg | ORAL_TABLET | Freq: Four times a day (QID) | ORAL | 0 refills | Status: AC | PRN

## 2017-11-14 MED ORDER — LISINOPRIL 20 MG PO TABS: 20.0000 mg | ORAL_TABLET | Freq: Every day | ORAL | 0 refills | Status: DC

## 2017-11-14 MED ORDER — AMOXICILLIN-POT CLAVULANATE 875-125 MG PO TABS: 1.0000 | ORAL_TABLET | Freq: Two times a day (BID) | ORAL | 0 refills | Status: AC

## 2017-11-14 MED ORDER — POLYETHYLENE GLYCOL 3350 17 G PO PACK: 17.0000 g | PACK | Freq: Every day | ORAL | 0 refills | Status: DC | PRN

## 2017-11-14 NOTE — Care Management Note (Signed)
Case Management Note Original Note Created Erenest Rasher, RN 11/05/2017, 11:20 AM [phone]   Patient Details  Name: Emily Wall MRN: [id number] Date of Birth: 06-May-1936  Subjective/Objective:    Left wound infection with cellulitis,  DM, HTN, CHF               Action/Plan: Discharge Planning: NCM spoke to pt and she lives at home with dtr, Neoma Laming. States she had HH in the past with [facility]. She states she does not have any DME at home. Waiting PT evaluation. Pt may benefit from RW with seat. Will need HHRN, PT and aide orders with [facility].   [facility] delivered RW with seat to room.  PCP Chevis Pretty MD  Expected Discharge Date:  11/14/17               Expected Discharge Plan:  [place]  In-House Referral:  NA  Discharge planning Services  CM Consult  Post Acute Care Choice:  Home Health, Durable Medical Equipment Choice offered to:  Patient  DME Arranged:  Conservation officer, nature with Seat DME Agency:  [facility]  HH Arranged:  PT, RN, aide [place] Agency:  [facility]  Status of Service:  Completed, signed off  If discussed at [place] of Stay Meetings, dates discussed:  11/06/2017  Discharge Disposition: home/home health   Additional Comments:  11/13/17- 1100- Donterrius Santucci RN, CM-- pt for d/c home today- referral has already been made to [facility]- with orders placed for HHRN/PT/OT/aide- notified Dan with [facility] of d/c today- per Dr. Jess Barters op note pt will need Prevena Plus portable wound VAC for home- Prevena Plus wound VAC picked up from OR and given to bedside RN- will also send extra cannister home with pt per Rickie with [facility] per pt's drainage since surgery. Bedside RN to convert hospital [facility] wound VAC to Prevena Plus prior to discharge. Rollator has been delivered to room already. Per conversation with pt no other needs noted.   11/06/2017 1119 Contacted [facility] with new referral for HH and RW with seat.    Dahlia Client Silvana,  RN 11/14/2017, 11:04 AM  [phone]

## 2017-11-14 NOTE — Discharge Summary (Signed)
Physician Discharge Summary  Emily Wall RXV:[id number] DOB: 1936-03-20 DOA: 11/02/2017  PCP: Chevis Pretty, FNP  Admit date: 11/02/2017 Discharge date: 11/14/2017  Admitted From: Home Disposition:  Home  Recommendations for Outpatient Follow-up:  1. Follow up with PCP in 1 week  2. Follow up with Dr. Sharol Given in 1 week  3. RIGHT leg: wound VAC for another week, then start dressing changes per Dr. Sharol Given recommendation  4. LEFT leg: will need 4 layer compression wrap changed on Friday 11/23  Home Health: PT OT RN   Equipment/Devices: wound vac, rolling walker   Discharge Condition: Stable CODE STATUS: Full  Diet recommendation: Heart healthy   Brief/Interim Summary: Emily Wall is a 81 year old female with history of diabetes mellitus, non-ischemic cardiomyopathy, aortic stenosis, chronic venous stasis, hypertension, dyslipidemia who presented to ED from rehab center for right leg wound infection. Patient notes that she has ulcerations present on both lower extremities,but the right leg specifically had become red and swollen with drainage over the last 3 weeks. She complains of associated pain,but has been taking hydrocodone with adequate relief. She had been started on ciprofloxacin approximately 1 week ago and reports that the lower extremity redness and swelling had improved. [place] the [place] of the wounds advised her to come in for further evaluation. Patient notes that she previously had skin grafts of the right lower extremity due to these venous stasis ulcers.  Patient was started on empiric vancomycin, cefepime, Flagyl on admission.  She was evaluated by orthopedic surgery Dr. Sharol Given and underwent debridement of the right Achilles tendon and posterior calf necrotic ulcer with wound VAC placement.  Antibiotic was deescalated to oral Augmentin.  Patient was also evaluated by vascular surgery and underwent aortogram  on 11/20, which did not reveal significant stenosis.  She is discharged home with wound VAC, home health services.  Discharge Diagnoses:  Principal Problem:   Wound infection Active Problems:   Essential hypertension   Aortic valve disorder   Diabetes mellitus (HCC)   Hypoalbuminemia   Venous stasis ulcer (HCC)   Diabetic leg ulcer ([place])   Atherosclerosis of native arteries of extremities with gangrene, right leg (HCC)   Right lower leg nonhealing, gangrenous wound infection, chronic venous stasis - Status post debridement of the right posterior calf necrotic ulcer and achilles tendon with application of split-thickness skin graft and wound vac with Dr. Sharol Given on 11/16.  Wound VAC in place for 1 more week. HHRN ordered for wound care.  - Empirically treated with vancomycin, cefepime, Flagyl. No cultures available. Patient VSS, WBC normal, and overall improving well. Spoke with Dr. Tommy Medal with ID (curbside consult) for antibiotic recommendation. Recommend Augmentin for 10 days post-debridement (last day tx 11/26).  - ABI unable to be completed on the right due to pain, ABI on left incomplete due to noncompressible vessels - Continue aspirin and statin therapy  - Vascular surgery consulted. Status post abdominal aortogram, bilateral lower extremity runoff. 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 - Continue SSI - A1c 8.1  Essential hypertension -  Continue coreg, lisinopril   Dyslipidemia associated with type 2 DM - Continue Lipitor    Discharge  Instructions  Discharge Instructions    Call MD for:  difficulty breathing, headache or visual disturbances   Complete by:  As directed    Call MD for:  extreme fatigue   Complete by:  As directed    Call MD for:  hives   Complete by:  As directed    Call MD for:  persistant dizziness or light-headedness   Complete by:  As directed    Call MD for:  persistant nausea and vomiting   Complete by:  As directed    Call MD for:  redness, tenderness, or signs of infection (pain, swelling, redness, odor or green/yellow discharge around incision site)   Complete by:  As directed    Call MD for:  severe uncontrolled pain   Complete by:  As directed    Call MD for:  temperature >100.4   Complete by:  As directed    Diet - low sodium heart healthy   Complete by:  As directed    Discharge instructions   Complete by:  As directed    You were cared for by a hospitalist during your hospital stay. If you have any questions about your discharge medications or the care you received while you were in the hospital after you are discharged, you can call the unit and asked to speak with the hospitalist on call if the hospitalist that took care of you is not available. Once you are discharged, your primary care physician will handle any further medical issues. Please note that NO REFILLS for any discharge medications will be authorized once you are discharged, as it is imperative that you return to your primary care physician (or establish a relationship with a primary care physician if you do not have one) for your aftercare needs so that they can reassess your need for medications and monitor your lab values.   Discharge wound care:   Complete by:  As directed    RIGHT leg: wound VAC for another week, then start dressing changes per Dr. Sharol Given recommendation  LEFT leg: will need 4 layer compression wrap changed on Friday 11/23   Increase activity slowly   Complete by:  As directed      Allergies as of  11/14/2017   No Known Allergies     Medication List    STOP taking these medications   ciprofloxacin 500 MG tablet Commonly known as:  CIPRO   HYDROcodone-acetaminophen 5-325 MG tablet Commonly known as:  LORTAB     TAKE these medications   alendronate 70 MG tablet Commonly known as:  FOSAMAX TAKE 1 TABLET BY MOUTH ONCE A WEEK WITH A FULL GLASS OF WATER ON AN EMPTY STOMACH   amoxicillin-clavulanate 875-125 MG tablet Commonly known as:  AUGMENTIN Take 1 tablet by mouth every 12 (twelve) hours for 5 days.   aspirin 325 MG tablet Take 325 mg by mouth daily.   atorvastatin 20 MG tablet Commonly known as:  LIPITOR Take 1 tablet (20 mg total) by mouth daily.   CALCIUM + D PO Take 1 tablet by mouth daily.   carvedilol 25 MG tablet Commonly known as:  COREG TAKE 1 TABLET (25 MG TOTAL) BY MOUTH 2 (TWO) TIMES DAILY WITH A MEAL.   cholecalciferol 1000 units tablet Commonly known as:  VITAMIN D Take 1,000 Units by mouth daily.   ferrous sulfate 325 (65 FE) MG tablet  Take 1 tablet (325 mg total) by mouth daily with breakfast.   furosemide 20 MG tablet Commonly known as:  LASIX Take 1 tablet (20 mg total) by mouth daily as needed. What changed:  reasons to take this   lisinopril 20 MG tablet Commonly known as:  PRINIVIL,ZESTRIL Take 1 tablet (20 mg total) by mouth daily. What changed:    medication strength  how much to take   metFORMIN 1000 MG tablet Commonly known as:  GLUCOPHAGE TAKE 1 TABLET (1,000 MG TOTAL) BY MOUTH 2 (TWO) TIMES DAILY WITH A MEAL.   oxyCODONE 5 MG immediate release tablet Commonly known as:  Oxy IR/ROXICODONE Take 1 tablet (5 mg total) by mouth every 6 (six) hours as needed for up to 3 days for moderate pain.   polyethylene glycol packet Commonly known as:  MIRALAX / GLYCOLAX Take 17 g by mouth daily as needed for mild constipation.   sitaGLIPtin 100 MG tablet Commonly known as:  JANUVIA Take 100 mg daily by mouth.   triamcinolone  cream 0.1 % Commonly known as:  KENALOG APPLY TO AFFECTED AREA TWICE A DAY What changed:  See the new instructions.            Durable Medical Equipment  (From admission, onward)        Start     Ordered   11/06/17 1019  For home use only DME 4 wheeled rolling walker with seat  Once    Question:  Patient needs a walker to treat with the following condition  Answer:  Diabetic leg ulcer ([place])   11/06/17 1019       Discharge Care Instructions  (From admission, onward)        Start     Ordered   11/14/17 0000  Discharge wound care:    Comments:  RIGHT leg: wound VAC for another week, then start dressing changes per Dr. Sharol Given recommendation  LEFT leg: will need 4 layer compression wrap changed on Friday 11/23   11/14/17 1025     Follow-up Information    [facility] Follow [facility].   Specialty:  Home Health Services Why:  Home Health RN, Physical Therapy and aide-agency will call with initial visit Contact information: [address] [place] [address] [phone]        Newt Minion, MD Follow up in 1 week(s).   Specialty:  Orthopedic Surgery Contact information: [place] [address] [phone]        Chevis Pretty, [place]. Schedule an appointment as soon as possible for a visit in 1 week(s).   Specialty:  Family Medicine Contact information: [place] [address] [phone]          No Known Allergies  Consultations:  Orthopedic surgery  Vascular surgery    Procedures/Studies: Dg Tibia/fibula Right Port  Result Date: 11/03/2017 CLINICAL DATA:  Wound.  Abscess. EXAM: PORTABLE RIGHT TIBIA AND FIBULA - 2 VIEW COMPARISON:  Right ankle radiographs - earlier same day FINDINGS: No fracture or dislocation. Limited visualization of the adjacent knee and ankle is normal given obliquity and large field of view. Extensive vascular calcifications within the lower leg. Mild  soft tissue swelling about the lower leg. No foci of subcutaneous emphysema. No radiopaque foreign body. No discrete areas of osteolysis to suggest osteomyelitis. IMPRESSION: No definitive radiographic correlate for reported area of lower leg wound. No radiographic evidence of osteomyelitis. Electronically Signed   By: Eldridge Abrahams.D.  On: 11/03/2017 11:11   Dg Ankle Right Port  Result Date: 11/03/2017 CLINICAL DATA:  Wound/abscess EXAM: PORTABLE RIGHT ANKLE - 2 VIEW COMPARISON:  Right tibia and fibular radiographs - earlier same day FINDINGS: No fracture or dislocation. Joint spaces appear preserved. The ankle mortise appears preserved. No definite ankle joint effusion. Potential minimal amount of soft tissue swelling about the anterolateral aspect of the ankle. No associated radiopaque foreign body or subcutaneous emphysema. No discrete areas of osteolysis to suggest osteomyelitis. Vascular calcifications.  Small plantar calcaneal spur IMPRESSION: Potential minimal amount of soft tissue swelling about the anterolateral aspect of the ankle without associated fracture, radiopaque foreign body or radiographic evidence of osteomyelitis. Electronically Signed   By: Sandi Mariscal M.D.   On: 11/03/2017 11:12    Aortogram with BLE runoff 11/20 Impression:             #1  Single-vessel runoff via the peroneal artery on the right without hemodynamically significant stenosis.             #2  Heavily calcified aortoiliac vasculature with no significant stenosis             #3  Bilateral superficial femoral and popliteal arteries are widely patent but calcified    Discharge Exam: Vitals:   11/14/17 0700 11/14/17 0800  BP:    Pulse: 78   Resp: 12 17  Temp:    SpO2: 100%    Vitals:   11/14/17 0523 11/14/17 0600 11/14/17 0700 11/14/17 0800  BP: 140/90     Pulse: 81 91 78   Resp: 20 (!) 21 12 17   Temp: 98.7 F (37.1 C)     TempSrc: Oral     SpO2: 100% 98% 100%   Weight: 75.7 kg (166 lb 14.2 oz)      Height:        General: Pt is alert, awake, not in acute distress Cardiovascular: RRR, S1/S2 +, no rubs, no gallops Respiratory: CTA bilaterally, no wheezing, no rhonchi Abdominal: Soft, NT, ND, bowel sounds + Extremities: no edema, no cyanosis. RLE with wound vac and edema, LLE with dressing in place    The results of significant diagnostics from this hospitalization (including imaging, microbiology, ancillary and laboratory) are listed below for reference.     Microbiology: Recent Results (from the past 240 hour(s))  MRSA PCR Screening     Status: Abnormal   Collection Time: 11/09/17  7:46 AM  Result Value Ref Range Status   MRSA by PCR POSITIVE (A) NEGATIVE Final    Comment:        The GeneXpert MRSA Assay (FDA approved for NASAL specimens only), is one component of a comprehensive MRSA colonization surveillance program. It is not intended to diagnose MRSA infection nor to guide or monitor treatment for MRSA infections. RESULT CALLED TO, READ BACK BY AND VERIFIED WITH: Z. Cal[place]itan RN 9:45 11/09/17 ([email or web address])      Labs: BNP (last 3 results) No results for input(s): BNP in the last 8760 hours. Basic Metabolic Panel: Recent Labs  Lab 11/08/17 0708 11/11/17 0422 11/12/17 0535 11/13/17 0430 11/14/17 0246  NA 135 136 137 136 134*  K 3.6 4.1 3.9 3.6 3.6  CL 103 106 105 102 104  CO2 28 24 27 28 26   GLUCOSE 209* 132* 166* 140* 186*  BUN 16 16 16 12 7   CREATININE 0.58 0.57 0.55 0.56 0.54  CALCIUM 8.0* 8.3* 8.1* 8.1* 7.7*   Liver Function Tests: No results for input(s): AST,  ALT, ALKPHOS, BILITOT, PROT, ALBUMIN in the last 168 hours. No results for input(s): LIPASE, AMYLASE in the last 168 hours. No results for input(s): AMMONIA in the last 168 hours. CBC: Recent Labs  Lab 11/08/17 0708 11/11/17 0422 11/12/17 0535  WBC 3.3* 4.5 4.6  HGB 9.6* 9.2* 9.6*  HCT 30.3* 28.9* 29.1*  MCV 92.9 92.6 92.4  PLT 214 219 218   Cardiac Enzymes: No results for  input(s): CKTOTAL, CKMB, CKMBINDEX, TROPONINI in the last 168 hours. BNP: Invalid input(s): POCBNP CBG: Recent Labs  Lab 11/13/17 0653 11/13/17 1145 11/13/17 1739 11/13/17 2141 11/14/17 0612  GLUCAP 134* 117* 126* 197* 160*   D-Dimer No results for input(s): DDIMER in the last 72 hours. Hgb A1c No results for input(s): HGBA1C in the last 72 hours. Lipid Profile No results for input(s): CHOL, HDL, LDLCALC, TRIG, CHOLHDL, LDLDIRECT in the last 72 hours. Thyroid function studies No results for input(s): TSH, T4TOTAL, T3FREE, THYROIDAB in the last 72 hours.  Invalid input(s): FREET3 Anemia work up No results for input(s): VITAMINB12, FOLATE, FERRITIN, TIBC, IRON, RETICCTPCT in the last 72 hours. Urinalysis No results found for: COLORURINE, APPEARANCEUR, [place], [place], [place], [place], [place], [place], PROTEINUR, UROBILINOGEN, NITRITE, LEUKOCYTESUR Sepsis Labs Invalid input(s): PROCALCITONIN,  WBC,  LACTICIDVEN Microbiology Recent Results (from the past 240 hour(s))  MRSA PCR Screening     Status: Abnormal   Collection Time: 11/09/17  7:46 AM  Result Value Ref Range Status   MRSA by PCR POSITIVE (A) NEGATIVE Final    Comment:        The GeneXpert MRSA Assay (FDA approved for NASAL specimens only), is one component of a comprehensive MRSA colonization surveillance program. It is not intended to diagnose MRSA infection nor to guide or monitor treatment for MRSA infections. RESULT CALLED TO, READ BACK BY AND VERIFIED WITH: Z. Calwitan RN 9:45 11/09/17 ([email or web address])      Time coordinating discharge: 40 minutes  SIGNED:  Dessa Phi, DO [facility] Pager [phone]  If 7PM-7AM, please contact night-coverage [email or web address] Password [email or web address] 11/14/2017, 1:32 PM

## 2017-11-14 NOTE — Progress Notes (Signed)
Patient d/c home, IV out. Wound vac changed to new portable type. Output from wound vac 300cc. Family instructed on how to change canister. Extra canister given, D/C instruction and education done. All questions and concerns answered.

## 2017-11-14 NOTE — Progress Notes (Signed)
  Progress Note    11/14/2017 8:55 AM 1 Day Post-Op  Subjective:  Patient denies rest pain in R foot.  Pain only to area of wound vac in medial R lower leg.   Vitals:   11/14/17 0700 11/14/17 0800  BP:    Pulse: 78   Resp: 12 17  Temp:    SpO2: 100%    Physical Exam: Lungs:  No inc resp effort Incisions:  Wound vac left in place R lower leg Extremities:  L groin catheterization site without palpable hematoma or pseudoaneurysm; R foot warm to touch with good capillary refill Abdomen:  Soft  CBC    Component Value Date/Time   WBC 4.6 11/12/2017 0535   RBC 3.15 (L) 11/12/2017 0535   HGB 9.6 (L) 11/12/2017 0535   HGB 10.1 (L) 05/29/2017 1421   HCT 29.1 (L) 11/12/2017 0535   HCT 31.5 (L) 05/29/2017 1421   PLT 218 11/12/2017 0535   PLT 202 05/29/2017 1421   MCV 92.4 11/12/2017 0535   MCV 93 05/29/2017 1421   MCH 30.5 11/12/2017 0535   MCHC 33.0 11/12/2017 0535   RDW 15.3 11/12/2017 0535   RDW 15.4 05/29/2017 1421   LYMPHSABS 1.0 11/07/2017 0415   LYMPHSABS 1.3 05/29/2017 1421   MONOABS 0.5 11/07/2017 0415   EOSABS 0.3 11/07/2017 0415   EOSABS 0.2 05/29/2017 1421   BASOSABS 0.0 11/07/2017 0415   BASOSABS 0.0 05/29/2017 1421    BMET    Component Value Date/Time   NA 134 (L) 11/14/2017 0246   NA 139 05/29/2017 1421   K 3.6 11/14/2017 0246   CL 104 11/14/2017 0246   CO2 26 11/14/2017 0246   GLUCOSE 186 (H) 11/14/2017 0246   BUN 7 11/14/2017 0246   BUN 8 05/29/2017 1421   CREATININE 0.54 11/14/2017 0246   CREATININE 0.74 06/02/2013 1318   CALCIUM 7.7 (L) 11/14/2017 0246   GFRNONAA >60 11/14/2017 0246   GFRNONAA 80 06/02/2013 1318   GFRAA >60 11/14/2017 0246   GFRAA >89 06/02/2013 1318    INR No results found for: INR  No intake or output data in the 24 hours ending 11/14/17 0855   Assessment/Plan:  81 y.o. female is s/p aortogram with BLE runoff by Dr. Trula Slade 1 Day Post-Op   Aortogram with BLE runoff NEGATIVE for hemodynamically significant  stenosis contributing to RLE ulceration  L groin catheterization site unremarkable Continue current wound care per Dr. Sharol Given Incidental finding of 60-70% RRA stenosis however BP stable with 2 drug antihypertensive regimen  DVT prophylaxis:  lovenox   Dagoberto Ligas, PA-C [facility] [phone] 11/14/2017 8:55 AM

## 2017-11-16 DIAGNOSIS — I110 Hypertensive heart disease with heart failure: Secondary | ICD-10-CM | POA: Diagnosis not present

## 2017-11-16 DIAGNOSIS — Z945 Skin transplant status: Secondary | ICD-10-CM | POA: Diagnosis not present

## 2017-11-16 DIAGNOSIS — I87332 Chronic venous hypertension (idiopathic) with ulcer and inflammation of left lower extremity: Secondary | ICD-10-CM | POA: Diagnosis not present

## 2017-11-16 DIAGNOSIS — L97822 Non-pressure chronic ulcer of other part of left lower leg with fat layer exposed: Secondary | ICD-10-CM | POA: Diagnosis not present

## 2017-11-16 DIAGNOSIS — Z4881 Encounter for surgical aftercare following surgery on specified body systems: Secondary | ICD-10-CM | POA: Diagnosis not present

## 2017-11-16 DIAGNOSIS — E1151 Type 2 diabetes mellitus with diabetic peripheral angiopathy without gangrene: Secondary | ICD-10-CM | POA: Diagnosis not present

## 2017-11-16 DIAGNOSIS — E11622 Type 2 diabetes mellitus with other skin ulcer: Secondary | ICD-10-CM | POA: Diagnosis not present

## 2017-11-16 DIAGNOSIS — I70261 Atherosclerosis of native arteries of extremities with gangrene, right leg: Secondary | ICD-10-CM | POA: Diagnosis not present

## 2017-11-16 DIAGNOSIS — Z48817 Encounter for surgical aftercare following surgery on the skin and subcutaneous tissue: Secondary | ICD-10-CM | POA: Diagnosis not present

## 2017-11-16 DIAGNOSIS — I2510 Atherosclerotic heart disease of native coronary artery without angina pectoris: Secondary | ICD-10-CM | POA: Diagnosis not present

## 2017-11-16 DIAGNOSIS — L97213 Non-pressure chronic ulcer of right calf with necrosis of muscle: Secondary | ICD-10-CM | POA: Diagnosis not present

## 2017-11-19 ENCOUNTER — Encounter (HOSPITAL_COMMUNITY): Payer: Self-pay | Admitting: Orthopedic Surgery

## 2017-11-19 ENCOUNTER — Telehealth (INDEPENDENT_AMBULATORY_CARE_PROVIDER_SITE_OTHER): Payer: Self-pay | Admitting: Radiology

## 2017-11-19 ENCOUNTER — Encounter (HOSPITAL_COMMUNITY): Payer: Medicare HMO | Admitting: Physical Therapy

## 2017-11-19 DIAGNOSIS — E1151 Type 2 diabetes mellitus with diabetic peripheral angiopathy without gangrene: Secondary | ICD-10-CM | POA: Diagnosis not present

## 2017-11-19 DIAGNOSIS — Z4881 Encounter for surgical aftercare following surgery on specified body systems: Secondary | ICD-10-CM | POA: Diagnosis not present

## 2017-11-19 DIAGNOSIS — Z945 Skin transplant status: Secondary | ICD-10-CM | POA: Diagnosis not present

## 2017-11-19 DIAGNOSIS — E11622 Type 2 diabetes mellitus with other skin ulcer: Secondary | ICD-10-CM | POA: Diagnosis not present

## 2017-11-19 DIAGNOSIS — I87332 Chronic venous hypertension (idiopathic) with ulcer and inflammation of left lower extremity: Secondary | ICD-10-CM | POA: Diagnosis not present

## 2017-11-19 DIAGNOSIS — I2510 Atherosclerotic heart disease of native coronary artery without angina pectoris: Secondary | ICD-10-CM | POA: Diagnosis not present

## 2017-11-19 DIAGNOSIS — L97213 Non-pressure chronic ulcer of right calf with necrosis of muscle: Secondary | ICD-10-CM | POA: Diagnosis not present

## 2017-11-19 DIAGNOSIS — Z48817 Encounter for surgical aftercare following surgery on the skin and subcutaneous tissue: Secondary | ICD-10-CM | POA: Diagnosis not present

## 2017-11-19 DIAGNOSIS — I70261 Atherosclerosis of native arteries of extremities with gangrene, right leg: Secondary | ICD-10-CM | POA: Diagnosis not present

## 2017-11-19 DIAGNOSIS — L97822 Non-pressure chronic ulcer of other part of left lower leg with fat layer exposed: Secondary | ICD-10-CM | POA: Diagnosis not present

## 2017-11-19 NOTE — Telephone Encounter (Signed)
Emily Wall called and asks what the frequency to change the profore dressing, please call her to advise.

## 2017-11-20 ENCOUNTER — Telehealth (INDEPENDENT_AMBULATORY_CARE_PROVIDER_SITE_OTHER): Payer: Self-pay | Admitting: Radiology

## 2017-11-20 ENCOUNTER — Telehealth (INDEPENDENT_AMBULATORY_CARE_PROVIDER_SITE_OTHER): Payer: Self-pay | Admitting: Orthopedic Surgery

## 2017-11-20 DIAGNOSIS — L97213 Non-pressure chronic ulcer of right calf with necrosis of muscle: Secondary | ICD-10-CM | POA: Diagnosis not present

## 2017-11-20 DIAGNOSIS — Z945 Skin transplant status: Secondary | ICD-10-CM | POA: Diagnosis not present

## 2017-11-20 DIAGNOSIS — E1151 Type 2 diabetes mellitus with diabetic peripheral angiopathy without gangrene: Secondary | ICD-10-CM | POA: Diagnosis not present

## 2017-11-20 DIAGNOSIS — I2510 Atherosclerotic heart disease of native coronary artery without angina pectoris: Secondary | ICD-10-CM | POA: Diagnosis not present

## 2017-11-20 DIAGNOSIS — E11622 Type 2 diabetes mellitus with other skin ulcer: Secondary | ICD-10-CM | POA: Diagnosis not present

## 2017-11-20 DIAGNOSIS — Z48817 Encounter for surgical aftercare following surgery on the skin and subcutaneous tissue: Secondary | ICD-10-CM | POA: Diagnosis not present

## 2017-11-20 DIAGNOSIS — I87332 Chronic venous hypertension (idiopathic) with ulcer and inflammation of left lower extremity: Secondary | ICD-10-CM | POA: Diagnosis not present

## 2017-11-20 DIAGNOSIS — L97822 Non-pressure chronic ulcer of other part of left lower leg with fat layer exposed: Secondary | ICD-10-CM | POA: Diagnosis not present

## 2017-11-20 DIAGNOSIS — I70261 Atherosclerosis of native arteries of extremities with gangrene, right leg: Secondary | ICD-10-CM | POA: Diagnosis not present

## 2017-11-20 DIAGNOSIS — Z4881 Encounter for surgical aftercare following surgery on specified body systems: Secondary | ICD-10-CM | POA: Diagnosis not present

## 2017-11-20 NOTE — Telephone Encounter (Signed)
Patient is calling states that her wound vac canister is full. She states that they do not have another one they need to know what they need to do.  Please call her back @ [phone] or [phone].

## 2017-11-20 NOTE — Telephone Encounter (Signed)
Message sent in error

## 2017-11-20 NOTE — Telephone Encounter (Signed)
Daughter called and asked if patient can have something for pain today?  She is out of her pain meds.  She will see Dr Sharol Given tomorrow morning for postop.  Also patient will take last amoxicillin tablet tonight, discuss more abx at tomorrow's visit?  Please call daughter, thanks.

## 2017-11-20 NOTE — Telephone Encounter (Signed)
Patient's daughter called again this morning.  Patient is a Dr. Sharol Given patient and wants to know what to do because of the canister being full.  CB#[phone] or [phone].  Thank you.

## 2017-11-20 NOTE — Telephone Encounter (Signed)
SEE MESSAGE

## 2017-11-20 NOTE — Telephone Encounter (Signed)
Pt is s/p an achilles debridement and STSG with vac on 11/09/17. Called and lm on vm for HHN to advise the pt has an appt on 11/22/17 ok to change x 1 and will update orders after that or she can leave intact provided there is no drainage coming through the dressing and await updated orders after visit. To call and let me know.

## 2017-11-20 NOTE — Telephone Encounter (Signed)
I called and spoke with patient, her daughter Neoma Laming answered. Advised the cannister is full, they have turned off the machine. They have no cannisters. I advised with prevena wound it is not meant for long term use, it needs to be removed and dry dressing applied. She is unsure if home health will be back out before appointment on Thursday. I have called [facility] from [facility] and advised this as well.

## 2017-11-21 ENCOUNTER — Other Ambulatory Visit (INDEPENDENT_AMBULATORY_CARE_PROVIDER_SITE_OTHER): Payer: Self-pay | Admitting: Family

## 2017-11-21 ENCOUNTER — Encounter (HOSPITAL_COMMUNITY): Payer: Medicare HMO | Admitting: Physical Therapy

## 2017-11-21 ENCOUNTER — Telehealth (INDEPENDENT_AMBULATORY_CARE_PROVIDER_SITE_OTHER): Payer: Self-pay | Admitting: Radiology

## 2017-11-21 DIAGNOSIS — L97213 Non-pressure chronic ulcer of right calf with necrosis of muscle: Secondary | ICD-10-CM | POA: Diagnosis not present

## 2017-11-21 DIAGNOSIS — E11622 Type 2 diabetes mellitus with other skin ulcer: Secondary | ICD-10-CM | POA: Diagnosis not present

## 2017-11-21 DIAGNOSIS — I87332 Chronic venous hypertension (idiopathic) with ulcer and inflammation of left lower extremity: Secondary | ICD-10-CM | POA: Diagnosis not present

## 2017-11-21 DIAGNOSIS — Z945 Skin transplant status: Secondary | ICD-10-CM | POA: Diagnosis not present

## 2017-11-21 DIAGNOSIS — E1151 Type 2 diabetes mellitus with diabetic peripheral angiopathy without gangrene: Secondary | ICD-10-CM | POA: Diagnosis not present

## 2017-11-21 DIAGNOSIS — Z48817 Encounter for surgical aftercare following surgery on the skin and subcutaneous tissue: Secondary | ICD-10-CM | POA: Diagnosis not present

## 2017-11-21 DIAGNOSIS — I2510 Atherosclerotic heart disease of native coronary artery without angina pectoris: Secondary | ICD-10-CM | POA: Diagnosis not present

## 2017-11-21 DIAGNOSIS — I70261 Atherosclerosis of native arteries of extremities with gangrene, right leg: Secondary | ICD-10-CM | POA: Diagnosis not present

## 2017-11-21 DIAGNOSIS — Z4881 Encounter for surgical aftercare following surgery on specified body systems: Secondary | ICD-10-CM | POA: Diagnosis not present

## 2017-11-21 DIAGNOSIS — L97822 Non-pressure chronic ulcer of other part of left lower leg with fat layer exposed: Secondary | ICD-10-CM | POA: Diagnosis not present

## 2017-11-21 MED ORDER — OXYCODONE-ACETAMINOPHEN 5-325 MG PO TABS: 1.0000 | ORAL_TABLET | Freq: Four times a day (QID) | ORAL | 0 refills | Status: DC | PRN

## 2017-11-21 NOTE — Telephone Encounter (Signed)
Achilles debridement 1 week out. Pt has an appt to come in tomorrow and is asking for a refill on pain medication today. Please advise.

## 2017-11-21 NOTE — Telephone Encounter (Signed)
I called to advise that rx is at the front desk for pick up

## 2017-11-21 NOTE — Telephone Encounter (Signed)
Duplicate message. Sent one for review about pain medication to erin

## 2017-11-21 NOTE — Telephone Encounter (Signed)
Daughter called again and asked for something for pain, please call her.

## 2017-11-22 ENCOUNTER — Encounter: Payer: Self-pay | Admitting: Nurse Practitioner

## 2017-11-22 ENCOUNTER — Ambulatory Visit (INDEPENDENT_AMBULATORY_CARE_PROVIDER_SITE_OTHER): Payer: Medicare HMO | Admitting: Family

## 2017-11-22 ENCOUNTER — Encounter (INDEPENDENT_AMBULATORY_CARE_PROVIDER_SITE_OTHER): Payer: Self-pay | Admitting: Family

## 2017-11-22 ENCOUNTER — Encounter: Payer: Medicare HMO | Admitting: Nurse Practitioner

## 2017-11-22 DIAGNOSIS — E11622 Type 2 diabetes mellitus with other skin ulcer: Secondary | ICD-10-CM

## 2017-11-22 DIAGNOSIS — L97909 Non-pressure chronic ulcer of unspecified part of unspecified lower leg with unspecified severity: Secondary | ICD-10-CM

## 2017-11-22 DIAGNOSIS — I83013 Varicose veins of right lower extremity with ulcer of ankle: Secondary | ICD-10-CM

## 2017-11-22 DIAGNOSIS — L089 Local infection of the skin and subcutaneous tissue, unspecified: Secondary | ICD-10-CM

## 2017-11-22 DIAGNOSIS — T148XXA Other injury of unspecified body region, initial encounter: Secondary | ICD-10-CM

## 2017-11-22 DIAGNOSIS — L97315 Non-pressure chronic ulcer of right ankle with muscle involvement without evidence of necrosis: Secondary | ICD-10-CM

## 2017-11-22 MED ORDER — SILVER SULFADIAZINE 1 % EX CREA: 1.0000 "application " | TOPICAL_CREAM | Freq: Every day | CUTANEOUS | 0 refills | Status: AC

## 2017-11-22 NOTE — Progress Notes (Signed)
Post-Op Visit Note   Patient: Emily Wall           Date of Birth: [birth year]           MRN: [id number] Visit Date: 11/22/2017 PCP: Chevis Pretty, FNP  Chief Complaint:  Chief Complaint  Patient presents with  . Right Leg - Routine Post Op    11/09/17 Right Leg Achilles Debridement, Split Thickness Skin Graft    HPI:  HPI Patient is a 81 year old woman who was seen today status post right leg Achilles debridement and ulcer debridement with split thickness skin grafting on November 16.  Home health did remove the wound VAC and has applied a dry dressing.  Ortho Exam On examination massive ulceration to the posterior right calf with nearly full exudative tissue.  Graft stapled in place.  Has not had significant drainage there is no surrounding erythema no odor no signs of infection  Visit Diagnoses:  1. Diabetic leg ulcer ([place])   2. Venous stasis ulcer of right ankle with muscle involvement without evidence of necrosis with varicose veins ([place])   3. Wound infection     Plan: Will tell [facility] to apply silvadene dressings compression wrap to RLE ulcer 3 times weekly. Follow up in office in 1 week to reevaluate.   Follow-Up Instructions: Return in about 1 week (around 11/29/2017).   Imaging: No results found.  Orders:  No orders of the defined types were placed in this encounter.  Meds ordered this encounter  Medications  . silver sulfADIAZINE (SILVADENE) 1 % cream    Sig: Apply 1 application topically daily.    Dispense:  50 g    Refill:  0     PMFS History: Patient Active Problem List   Diagnosis Date Noted  . Diabetic leg ulcer ([place])   . Atherosclerosis of native arteries of extremities with gangrene, right leg ([place])   . Wound infection 11/03/2017  . Hypoalbuminemia 11/03/2017  . Venous stasis ulcer ([place]) 11/03/2017  . BMI 26.0-26.9,adult 11/15/2015  . Family history of malignant neoplasm of gastrointestinal tract 06/30/2013  .  Hyperlipidemia 06/02/2013  . Diabetes mellitus ([place]) 03/29/2011  . Osteoporosis 03/29/2011  . Iron deficiency anemia 12/29/2010  . Essential hypertension 09/08/2009  . Aortic valve disorder 09/08/2009  . Secondary cardiomyopathy ([place]) 09/08/2009   Past Medical History:  Diagnosis Date  . Aortic regurgitation    Mild  . Aortic stenosis    Moderate  . Coronary atherosclerosis of native coronary artery    Nonobstructive 2007  . Essential hypertension, benign   . Hyperlipidemia   . Hypothyroidism   . Nonischemic cardiomyopathy (HCC)    LVEF improved to 50-55%  . Osteoporosis   . Type 2 diabetes mellitus (HCC)     Family History  Problem Relation Age of Onset  . Colon cancer Mother   . Colon cancer Brother   . Diabetes Brother   . Prostate cancer Brother     Past Surgical History:  Procedure Laterality Date  . LOWER EXTREMITY ANGIOGRAPHY N/A 11/13/2017   Procedure: LOWER EXTREMITY ANGIOGRAPHY;  Surgeon: Serafina Mitchell, MD;  Location: [facility];  Service: Cardiovascular;  Laterality: N/A;  . SKIN GRAFT    . SKIN SPLIT GRAFT Right 11/09/2017   Procedure: RIGHT LEG DEBRIDE ACHILLES, SPLIT THICKNESS SKIN GRAFT, VAC;  Surgeon: Newt Minion, MD;  Location: [facility];  Service: Orthopedics;  Laterality: Right;   Social History   Occupational History    Employer: RETIRED  Tobacco Use  . Smoking status: Former Smoker    Packs/day: 0.50    Years: 30.00    Pack years: 15.00    Types: Cigarettes    Last attempt to quit: 12/25/1988    Years since quitting: 28.9  . Smokeless tobacco: Never Used  Substance and Sexual Activity  . Alcohol use: No    Alcohol/week: 0.0 oz  . Drug use: No  . Sexual activity: No

## 2017-11-22 NOTE — Progress Notes (Signed)
Patrient come sin today for hospital follow up but she saw her orthopedic daughter who did her surgery this morning and they did dressing change on her leg. So I cannot even see her leg. Patient had an ulcer on her leg and had to have surgical debridement and skin graft.

## 2017-11-23 ENCOUNTER — Telehealth (INDEPENDENT_AMBULATORY_CARE_PROVIDER_SITE_OTHER): Payer: Self-pay | Admitting: Family

## 2017-11-23 ENCOUNTER — Inpatient Hospital Stay (INDEPENDENT_AMBULATORY_CARE_PROVIDER_SITE_OTHER): Payer: Medicare HMO | Admitting: Family

## 2017-11-23 ENCOUNTER — Other Ambulatory Visit (INDEPENDENT_AMBULATORY_CARE_PROVIDER_SITE_OTHER): Payer: Self-pay

## 2017-11-23 ENCOUNTER — Encounter (HOSPITAL_COMMUNITY): Payer: Medicare HMO | Admitting: Physical Therapy

## 2017-11-23 DIAGNOSIS — E11622 Type 2 diabetes mellitus with other skin ulcer: Secondary | ICD-10-CM | POA: Diagnosis not present

## 2017-11-23 DIAGNOSIS — I70261 Atherosclerosis of native arteries of extremities with gangrene, right leg: Secondary | ICD-10-CM | POA: Diagnosis not present

## 2017-11-23 DIAGNOSIS — E1151 Type 2 diabetes mellitus with diabetic peripheral angiopathy without gangrene: Secondary | ICD-10-CM | POA: Diagnosis not present

## 2017-11-23 DIAGNOSIS — Z945 Skin transplant status: Secondary | ICD-10-CM | POA: Diagnosis not present

## 2017-11-23 DIAGNOSIS — I87332 Chronic venous hypertension (idiopathic) with ulcer and inflammation of left lower extremity: Secondary | ICD-10-CM | POA: Diagnosis not present

## 2017-11-23 DIAGNOSIS — L97822 Non-pressure chronic ulcer of other part of left lower leg with fat layer exposed: Secondary | ICD-10-CM | POA: Diagnosis not present

## 2017-11-23 DIAGNOSIS — L97213 Non-pressure chronic ulcer of right calf with necrosis of muscle: Secondary | ICD-10-CM | POA: Diagnosis not present

## 2017-11-23 DIAGNOSIS — I2510 Atherosclerotic heart disease of native coronary artery without angina pectoris: Secondary | ICD-10-CM | POA: Diagnosis not present

## 2017-11-23 DIAGNOSIS — Z48817 Encounter for surgical aftercare following surgery on the skin and subcutaneous tissue: Secondary | ICD-10-CM | POA: Diagnosis not present

## 2017-11-23 DIAGNOSIS — Z4881 Encounter for surgical aftercare following surgery on specified body systems: Secondary | ICD-10-CM | POA: Diagnosis not present

## 2017-11-23 NOTE — Telephone Encounter (Signed)
Tried calling without any answer. Will try calling again later to advise per Erin no longer needing antibiotics.

## 2017-11-23 NOTE — Telephone Encounter (Signed)
Patient's daughter called stating that her mother is out of the antibiotic and is wanting to know if she needs to continue taking it and if so, could an RX refill be called in for that.  CB#[phone].  Thank you.

## 2017-11-26 ENCOUNTER — Encounter (HOSPITAL_COMMUNITY): Payer: Medicare HMO | Admitting: Physical Therapy

## 2017-11-26 DIAGNOSIS — Z48817 Encounter for surgical aftercare following surgery on the skin and subcutaneous tissue: Secondary | ICD-10-CM | POA: Diagnosis not present

## 2017-11-26 DIAGNOSIS — I70261 Atherosclerosis of native arteries of extremities with gangrene, right leg: Secondary | ICD-10-CM | POA: Diagnosis not present

## 2017-11-26 DIAGNOSIS — I87332 Chronic venous hypertension (idiopathic) with ulcer and inflammation of left lower extremity: Secondary | ICD-10-CM | POA: Diagnosis not present

## 2017-11-26 DIAGNOSIS — L97213 Non-pressure chronic ulcer of right calf with necrosis of muscle: Secondary | ICD-10-CM | POA: Diagnosis not present

## 2017-11-26 DIAGNOSIS — I2510 Atherosclerotic heart disease of native coronary artery without angina pectoris: Secondary | ICD-10-CM | POA: Diagnosis not present

## 2017-11-26 DIAGNOSIS — L97822 Non-pressure chronic ulcer of other part of left lower leg with fat layer exposed: Secondary | ICD-10-CM | POA: Diagnosis not present

## 2017-11-26 DIAGNOSIS — Z4881 Encounter for surgical aftercare following surgery on specified body systems: Secondary | ICD-10-CM | POA: Diagnosis not present

## 2017-11-26 DIAGNOSIS — Z945 Skin transplant status: Secondary | ICD-10-CM | POA: Diagnosis not present

## 2017-11-26 DIAGNOSIS — E1151 Type 2 diabetes mellitus with diabetic peripheral angiopathy without gangrene: Secondary | ICD-10-CM | POA: Diagnosis not present

## 2017-11-26 DIAGNOSIS — E11622 Type 2 diabetes mellitus with other skin ulcer: Secondary | ICD-10-CM | POA: Diagnosis not present

## 2017-11-27 ENCOUNTER — Telehealth (INDEPENDENT_AMBULATORY_CARE_PROVIDER_SITE_OTHER): Payer: Self-pay | Admitting: Family

## 2017-11-27 DIAGNOSIS — L97213 Non-pressure chronic ulcer of right calf with necrosis of muscle: Secondary | ICD-10-CM | POA: Diagnosis not present

## 2017-11-27 DIAGNOSIS — E1151 Type 2 diabetes mellitus with diabetic peripheral angiopathy without gangrene: Secondary | ICD-10-CM | POA: Diagnosis not present

## 2017-11-27 DIAGNOSIS — L97822 Non-pressure chronic ulcer of other part of left lower leg with fat layer exposed: Secondary | ICD-10-CM | POA: Diagnosis not present

## 2017-11-27 DIAGNOSIS — Z945 Skin transplant status: Secondary | ICD-10-CM | POA: Diagnosis not present

## 2017-11-27 DIAGNOSIS — I87332 Chronic venous hypertension (idiopathic) with ulcer and inflammation of left lower extremity: Secondary | ICD-10-CM | POA: Diagnosis not present

## 2017-11-27 DIAGNOSIS — I70261 Atherosclerosis of native arteries of extremities with gangrene, right leg: Secondary | ICD-10-CM | POA: Diagnosis not present

## 2017-11-27 DIAGNOSIS — E11622 Type 2 diabetes mellitus with other skin ulcer: Secondary | ICD-10-CM | POA: Diagnosis not present

## 2017-11-27 DIAGNOSIS — I2510 Atherosclerotic heart disease of native coronary artery without angina pectoris: Secondary | ICD-10-CM | POA: Diagnosis not present

## 2017-11-27 DIAGNOSIS — Z48817 Encounter for surgical aftercare following surgery on the skin and subcutaneous tissue: Secondary | ICD-10-CM | POA: Diagnosis not present

## 2017-11-27 DIAGNOSIS — Z4881 Encounter for surgical aftercare following surgery on specified body systems: Secondary | ICD-10-CM | POA: Diagnosis not present

## 2017-11-27 NOTE — Telephone Encounter (Signed)
Returned call to patient no answer and no answering machine pickup

## 2017-11-28 ENCOUNTER — Encounter (HOSPITAL_COMMUNITY): Payer: Medicare HMO | Admitting: Physical Therapy

## 2017-11-29 ENCOUNTER — Encounter (INDEPENDENT_AMBULATORY_CARE_PROVIDER_SITE_OTHER): Payer: Self-pay | Admitting: Family

## 2017-11-29 ENCOUNTER — Telehealth (INDEPENDENT_AMBULATORY_CARE_PROVIDER_SITE_OTHER): Payer: Self-pay | Admitting: Family

## 2017-11-29 ENCOUNTER — Ambulatory Visit (INDEPENDENT_AMBULATORY_CARE_PROVIDER_SITE_OTHER): Payer: Medicare HMO | Admitting: Family

## 2017-11-29 DIAGNOSIS — L97909 Non-pressure chronic ulcer of unspecified part of unspecified lower leg with unspecified severity: Secondary | ICD-10-CM

## 2017-11-29 DIAGNOSIS — E11622 Type 2 diabetes mellitus with other skin ulcer: Secondary | ICD-10-CM

## 2017-11-29 DIAGNOSIS — L97812 Non-pressure chronic ulcer of other part of right lower leg with fat layer exposed: Secondary | ICD-10-CM

## 2017-11-29 DIAGNOSIS — Z945 Skin transplant status: Secondary | ICD-10-CM | POA: Insufficient documentation

## 2017-11-29 MED ORDER — OXYCODONE-ACETAMINOPHEN 5-325 MG PO TABS: 1.0000 | ORAL_TABLET | Freq: Four times a day (QID) | ORAL | 0 refills | Status: DC | PRN

## 2017-11-29 NOTE — Telephone Encounter (Signed)
If you could actually call [phone].

## 2017-11-29 NOTE — Addendum Note (Signed)
Addended by: Dondra Prader R on: 11/29/2017 11:24 AM   Modules accepted: Orders

## 2017-11-29 NOTE — Patient Instructions (Signed)
Staples were removed in the office today. An adaptic, vasline non-adhering gauze was placed over your wound with serial compression wrap ( 3 layer compression wrap) referred to as dynaflex. We will see you back in the office in 2 weeks. We are requesting your home health agency change this twice a week in the interim until we see you back in the office. Home health physical therapy ok to work on range of motion exercises. But when you are not active we ask that you elevate your leg at level of your heart. This will help reduce any throbbing pain you maybe experiencing from swelling. Call our office if you have any questions.

## 2017-11-29 NOTE — Telephone Encounter (Signed)
Called and lm on vm for home health nurse to advise unna boot dressing changes to be applied bilateral lower extremities with adaptic or xeroform to the achilles on the right. To call with questions or if a faxed order needs to be faxed.

## 2017-11-29 NOTE — Telephone Encounter (Signed)
Patient's daughter called and wanted to give [place] the [facility] Nurse's number [phone].  Thank you.

## 2017-11-29 NOTE — Progress Notes (Signed)
Post-Op Visit Note   Patient: Emily Wall           Date of Birth: 1936/09/12           MRN: [id number] Visit Date: 11/29/2017 PCP: Chevis Pretty, FNP  Chief Complaint:  Chief Complaint  Patient presents with  . Right Leg - Wound Check    11/09/17 Right Leg Debridement of Achilles with split thickness skin graft. 20 days post op.     HPI:  HPI Patient is a 81 year old woman who was seen today status post right leg Achilles debridement and ulcer debridement with split thickness skin grafting on November 09, 2017.  Home health assisting with wound care, silvadene dressing changes 3 times a week.   Today no silvadene seen in wound bed under dressing. Wound dry.   Ortho Exam On examination massive ulceration to the posterior right calf with nearly full exudative tissue.  Scattered Graft material stapled in place.  Has not had significant drainage there is no surrounding erythema no odor no signs of infection  Visit Diagnoses:  No diagnosis found.  Plan: Will update [facility] to apply vaseline gauze with dynaflex compression wrap to RLE ulcer 3 times weekly. Follow up in office in 2 week to reevaluate.   Follow-Up Instructions: No Follow-up on file.   Imaging: No results found.  Orders:  No orders of the defined types were placed in this encounter.  No orders of the defined types were placed in this encounter.    PMFS History: Patient Active Problem List   Diagnosis Date Noted  . Diabetic leg ulcer ([place])   . Atherosclerosis of native arteries of extremities with gangrene, right leg ([place])   . Wound infection 11/03/2017  . Hypoalbuminemia 11/03/2017  . Venous stasis ulcer ([place]) 11/03/2017  . BMI 26.0-26.9,adult 11/15/2015  . Family history of malignant neoplasm of gastrointestinal tract 06/30/2013  . Hyperlipidemia 06/02/2013  . Diabetes mellitus ([place]) 03/29/2011  . Osteoporosis 03/29/2011  . Iron deficiency anemia 12/29/2010  . Essential  hypertension 09/08/2009  . Aortic valve disorder 09/08/2009  . Secondary cardiomyopathy ([place]) 09/08/2009   Past Medical History:  Diagnosis Date  . Aortic regurgitation    Mild  . Aortic stenosis    Moderate  . Coronary atherosclerosis of native coronary artery    Nonobstructive 2007  . Essential hypertension, benign   . Hyperlipidemia   . Hypothyroidism   . Nonischemic cardiomyopathy (HCC)    LVEF improved to 50-55%  . Osteoporosis   . Type 2 diabetes mellitus (HCC)     Family History  Problem Relation Age of Onset  . Colon cancer Mother   . Colon cancer Brother   . Diabetes Brother   . Prostate cancer Brother     Past Surgical History:  Procedure Laterality Date  . LOWER EXTREMITY ANGIOGRAPHY N/A 11/13/2017   Procedure: LOWER EXTREMITY ANGIOGRAPHY;  Surgeon: Serafina Mitchell, MD;  Location: [facility];  Service: Cardiovascular;  Laterality: N/A;  . SKIN GRAFT    . SKIN SPLIT GRAFT Right 11/09/2017   Procedure: RIGHT LEG DEBRIDE ACHILLES, SPLIT THICKNESS SKIN GRAFT, VAC;  Surgeon: Newt Minion, MD;  Location: [facility];  Service: Orthopedics;  Laterality: Right;   Social History   Occupational History    Employer: RETIRED  Tobacco Use  . Smoking status: Former Smoker    Packs/day: 0.50    Years: 30.00    Pack years: 15.00    Types: Cigarettes    Last  attempt to quit: 12/25/1988    Years since quitting: 28.9  . Smokeless tobacco: Never Used  Substance and Sexual Activity  . Alcohol use: No    Alcohol/week: 0.0 oz  . Drug use: No  . Sexual activity: No

## 2017-11-30 ENCOUNTER — Telehealth (INDEPENDENT_AMBULATORY_CARE_PROVIDER_SITE_OTHER): Payer: Self-pay | Admitting: Family

## 2017-11-30 ENCOUNTER — Encounter (HOSPITAL_COMMUNITY): Payer: Medicare HMO | Admitting: Physical Therapy

## 2017-11-30 DIAGNOSIS — Z945 Skin transplant status: Secondary | ICD-10-CM | POA: Diagnosis not present

## 2017-11-30 DIAGNOSIS — E1151 Type 2 diabetes mellitus with diabetic peripheral angiopathy without gangrene: Secondary | ICD-10-CM | POA: Diagnosis not present

## 2017-11-30 DIAGNOSIS — Z4881 Encounter for surgical aftercare following surgery on specified body systems: Secondary | ICD-10-CM | POA: Diagnosis not present

## 2017-11-30 DIAGNOSIS — Z48817 Encounter for surgical aftercare following surgery on the skin and subcutaneous tissue: Secondary | ICD-10-CM | POA: Diagnosis not present

## 2017-11-30 DIAGNOSIS — E11622 Type 2 diabetes mellitus with other skin ulcer: Secondary | ICD-10-CM | POA: Diagnosis not present

## 2017-11-30 DIAGNOSIS — I2510 Atherosclerotic heart disease of native coronary artery without angina pectoris: Secondary | ICD-10-CM | POA: Diagnosis not present

## 2017-11-30 DIAGNOSIS — I70261 Atherosclerosis of native arteries of extremities with gangrene, right leg: Secondary | ICD-10-CM | POA: Diagnosis not present

## 2017-11-30 DIAGNOSIS — I87332 Chronic venous hypertension (idiopathic) with ulcer and inflammation of left lower extremity: Secondary | ICD-10-CM | POA: Diagnosis not present

## 2017-11-30 DIAGNOSIS — L97213 Non-pressure chronic ulcer of right calf with necrosis of muscle: Secondary | ICD-10-CM | POA: Diagnosis not present

## 2017-11-30 DIAGNOSIS — L97822 Non-pressure chronic ulcer of other part of left lower leg with fat layer exposed: Secondary | ICD-10-CM | POA: Diagnosis not present

## 2017-11-30 NOTE — Telephone Encounter (Signed)
Estill Bamberg from [facility] called having a few questions in regards to patients orders from yesterday. CB # [phone]

## 2017-11-30 NOTE — Telephone Encounter (Signed)
Advised ok to change left lower extremity dressing once a week. Right lower extremity three times a week.

## 2017-12-01 DIAGNOSIS — I87332 Chronic venous hypertension (idiopathic) with ulcer and inflammation of left lower extremity: Secondary | ICD-10-CM | POA: Diagnosis not present

## 2017-12-01 DIAGNOSIS — L97213 Non-pressure chronic ulcer of right calf with necrosis of muscle: Secondary | ICD-10-CM | POA: Diagnosis not present

## 2017-12-01 DIAGNOSIS — Z48817 Encounter for surgical aftercare following surgery on the skin and subcutaneous tissue: Secondary | ICD-10-CM | POA: Diagnosis not present

## 2017-12-01 DIAGNOSIS — Z4881 Encounter for surgical aftercare following surgery on specified body systems: Secondary | ICD-10-CM | POA: Diagnosis not present

## 2017-12-01 DIAGNOSIS — L97822 Non-pressure chronic ulcer of other part of left lower leg with fat layer exposed: Secondary | ICD-10-CM | POA: Diagnosis not present

## 2017-12-01 DIAGNOSIS — I70261 Atherosclerosis of native arteries of extremities with gangrene, right leg: Secondary | ICD-10-CM | POA: Diagnosis not present

## 2017-12-01 DIAGNOSIS — E11622 Type 2 diabetes mellitus with other skin ulcer: Secondary | ICD-10-CM | POA: Diagnosis not present

## 2017-12-01 DIAGNOSIS — Z945 Skin transplant status: Secondary | ICD-10-CM | POA: Diagnosis not present

## 2017-12-01 DIAGNOSIS — I2510 Atherosclerotic heart disease of native coronary artery without angina pectoris: Secondary | ICD-10-CM | POA: Diagnosis not present

## 2017-12-01 DIAGNOSIS — E1151 Type 2 diabetes mellitus with diabetic peripheral angiopathy without gangrene: Secondary | ICD-10-CM | POA: Diagnosis not present

## 2017-12-04 DIAGNOSIS — E1151 Type 2 diabetes mellitus with diabetic peripheral angiopathy without gangrene: Secondary | ICD-10-CM | POA: Diagnosis not present

## 2017-12-04 DIAGNOSIS — I70261 Atherosclerosis of native arteries of extremities with gangrene, right leg: Secondary | ICD-10-CM | POA: Diagnosis not present

## 2017-12-04 DIAGNOSIS — Z48817 Encounter for surgical aftercare following surgery on the skin and subcutaneous tissue: Secondary | ICD-10-CM | POA: Diagnosis not present

## 2017-12-04 DIAGNOSIS — I2510 Atherosclerotic heart disease of native coronary artery without angina pectoris: Secondary | ICD-10-CM | POA: Diagnosis not present

## 2017-12-04 DIAGNOSIS — E11622 Type 2 diabetes mellitus with other skin ulcer: Secondary | ICD-10-CM | POA: Diagnosis not present

## 2017-12-04 DIAGNOSIS — Z945 Skin transplant status: Secondary | ICD-10-CM | POA: Diagnosis not present

## 2017-12-04 DIAGNOSIS — Z4881 Encounter for surgical aftercare following surgery on specified body systems: Secondary | ICD-10-CM | POA: Diagnosis not present

## 2017-12-04 DIAGNOSIS — L97822 Non-pressure chronic ulcer of other part of left lower leg with fat layer exposed: Secondary | ICD-10-CM | POA: Diagnosis not present

## 2017-12-04 DIAGNOSIS — I87332 Chronic venous hypertension (idiopathic) with ulcer and inflammation of left lower extremity: Secondary | ICD-10-CM | POA: Diagnosis not present

## 2017-12-04 DIAGNOSIS — L97213 Non-pressure chronic ulcer of right calf with necrosis of muscle: Secondary | ICD-10-CM | POA: Diagnosis not present

## 2017-12-06 ENCOUNTER — Other Ambulatory Visit: Payer: Self-pay | Admitting: Nurse Practitioner

## 2017-12-06 DIAGNOSIS — R609 Edema, unspecified: Secondary | ICD-10-CM

## 2017-12-07 DIAGNOSIS — Z945 Skin transplant status: Secondary | ICD-10-CM | POA: Diagnosis not present

## 2017-12-07 DIAGNOSIS — I70261 Atherosclerosis of native arteries of extremities with gangrene, right leg: Secondary | ICD-10-CM | POA: Diagnosis not present

## 2017-12-07 DIAGNOSIS — I87332 Chronic venous hypertension (idiopathic) with ulcer and inflammation of left lower extremity: Secondary | ICD-10-CM | POA: Diagnosis not present

## 2017-12-07 DIAGNOSIS — E11622 Type 2 diabetes mellitus with other skin ulcer: Secondary | ICD-10-CM | POA: Diagnosis not present

## 2017-12-07 DIAGNOSIS — I2510 Atherosclerotic heart disease of native coronary artery without angina pectoris: Secondary | ICD-10-CM | POA: Diagnosis not present

## 2017-12-07 DIAGNOSIS — Z4881 Encounter for surgical aftercare following surgery on specified body systems: Secondary | ICD-10-CM | POA: Diagnosis not present

## 2017-12-07 DIAGNOSIS — L97213 Non-pressure chronic ulcer of right calf with necrosis of muscle: Secondary | ICD-10-CM | POA: Diagnosis not present

## 2017-12-07 DIAGNOSIS — L97822 Non-pressure chronic ulcer of other part of left lower leg with fat layer exposed: Secondary | ICD-10-CM | POA: Diagnosis not present

## 2017-12-07 DIAGNOSIS — Z48817 Encounter for surgical aftercare following surgery on the skin and subcutaneous tissue: Secondary | ICD-10-CM | POA: Diagnosis not present

## 2017-12-07 DIAGNOSIS — E1151 Type 2 diabetes mellitus with diabetic peripheral angiopathy without gangrene: Secondary | ICD-10-CM | POA: Diagnosis not present

## 2017-12-09 ENCOUNTER — Other Ambulatory Visit: Payer: Self-pay | Admitting: Nurse Practitioner

## 2017-12-09 DIAGNOSIS — D509 Iron deficiency anemia, unspecified: Secondary | ICD-10-CM

## 2017-12-11 DIAGNOSIS — Z48817 Encounter for surgical aftercare following surgery on the skin and subcutaneous tissue: Secondary | ICD-10-CM | POA: Diagnosis not present

## 2017-12-11 DIAGNOSIS — E11622 Type 2 diabetes mellitus with other skin ulcer: Secondary | ICD-10-CM | POA: Diagnosis not present

## 2017-12-11 DIAGNOSIS — I70261 Atherosclerosis of native arteries of extremities with gangrene, right leg: Secondary | ICD-10-CM | POA: Diagnosis not present

## 2017-12-11 DIAGNOSIS — Z945 Skin transplant status: Secondary | ICD-10-CM | POA: Diagnosis not present

## 2017-12-11 DIAGNOSIS — Z4881 Encounter for surgical aftercare following surgery on specified body systems: Secondary | ICD-10-CM | POA: Diagnosis not present

## 2017-12-11 DIAGNOSIS — L97213 Non-pressure chronic ulcer of right calf with necrosis of muscle: Secondary | ICD-10-CM | POA: Diagnosis not present

## 2017-12-11 DIAGNOSIS — I87332 Chronic venous hypertension (idiopathic) with ulcer and inflammation of left lower extremity: Secondary | ICD-10-CM | POA: Diagnosis not present

## 2017-12-11 DIAGNOSIS — E1151 Type 2 diabetes mellitus with diabetic peripheral angiopathy without gangrene: Secondary | ICD-10-CM | POA: Diagnosis not present

## 2017-12-11 DIAGNOSIS — I2510 Atherosclerotic heart disease of native coronary artery without angina pectoris: Secondary | ICD-10-CM | POA: Diagnosis not present

## 2017-12-11 DIAGNOSIS — L97822 Non-pressure chronic ulcer of other part of left lower leg with fat layer exposed: Secondary | ICD-10-CM | POA: Diagnosis not present

## 2017-12-13 ENCOUNTER — Encounter (INDEPENDENT_AMBULATORY_CARE_PROVIDER_SITE_OTHER): Payer: Self-pay | Admitting: Family

## 2017-12-13 ENCOUNTER — Ambulatory Visit (INDEPENDENT_AMBULATORY_CARE_PROVIDER_SITE_OTHER): Payer: Medicare HMO | Admitting: Orthopedic Surgery

## 2017-12-13 DIAGNOSIS — I83013 Varicose veins of right lower extremity with ulcer of ankle: Secondary | ICD-10-CM | POA: Diagnosis not present

## 2017-12-13 DIAGNOSIS — L97315 Non-pressure chronic ulcer of right ankle with muscle involvement without evidence of necrosis: Secondary | ICD-10-CM

## 2017-12-13 MED ORDER — OXYCODONE-ACETAMINOPHEN 5-325 MG PO TABS: 1.0000 | ORAL_TABLET | Freq: Three times a day (TID) | ORAL | 0 refills | Status: DC | PRN

## 2017-12-13 NOTE — Progress Notes (Addendum)
Office Visit Note   Patient: Emily Wall           Date of Birth: 1936-08-21           MRN: [id number] Visit Date: 12/13/2017              Requested by: Chevis Pretty, [address], [place] [address] PCP: Chevis Pretty, FNP  Chief Complaint  Patient presents with  . Right Leg - Wound Check  . Left Leg - Edema      HPI: Patient presents in follow-up status post split-thickness skin graft and wound VAC therapy for a massive chronic necrotic ulceration over the Achilles and posterior aspect the right calf.  Patient is also had traumatic venous ulcers in the left lower extremity.  Patient is undergoing twice a week compressive wrap dressing changes with home health nursing.  Assessment & Plan: Visit Diagnoses:  1. Venous stasis ulcer of right ankle with muscle involvement without evidence of necrosis with varicose veins ([place])     Plan: We will have her go to [facility] to obtain a knee-high compression stocking for the left leg.  We will continue with the compression wraps for the right leg.  Recommended the importance of walking to stimulate the calf muscle pump avoid standing avoid dependency of the leg.  Follow-Up Instructions: Return in about 2 weeks (around 12/27/2017).   Ortho Exam  Patient is alert, oriented, no adenopathy, well-dressed, normal affect, normal respiratory effort. Examination the left leg has venous stasis changes but the ulcers have healed there is no drainage no cellulitis.  Examination the right leg she has a thin layer of fibrinous exudative tissue over the skin graft.  There is no wound impression the skin graft is level with the surrounding tissue.  There is no cellulitis no odor no signs of infection.  There is no drainage.  Imaging: No results found. No images are attached to the encounter.  Labs: Lab Results  Component Value Date   HGBA1C 8.1 (H) 11/03/2017   HGBA1C 6.7 02/15/2016   HGBA1C 7.2  11/15/2015   ESRSEDRATE 26 (H) 11/03/2017   CRP 1.8 (H) 11/03/2017   REPTSTATUS 11/08/2017 FINAL 11/03/2017   CULT NO GROWTH 5 DAYS 11/03/2017    @LABSALLVALUES (HGBA1)@  There is no height or weight on file to calculate BMI.  Orders:  No orders of the defined types were placed in this encounter.  Meds ordered this encounter  Medications  . oxyCODONE-acetaminophen (PERCOCET/ROXICET) 5-325 MG tablet    Sig: Take 1 tablet by mouth every 8 (eight) hours as needed for severe pain.    Dispense:  30 tablet    Refill:  0     Procedures: No procedures performed  Clinical Data: No additional findings.  ROS:  All other systems negative, except as noted in the HPI. Review of Systems  Objective: Vital Signs: There were no vitals taken for this visit.  Specialty Comments:  No specialty comments available.  PMFS History: Patient Active Problem List   Diagnosis Date Noted  . S/P split thickness skin graft 11/29/2017  . Diabetic leg ulcer ([place])   . Atherosclerosis of native arteries of extremities with gangrene, right leg ([place])   . Wound infection 11/03/2017  . Hypoalbuminemia 11/03/2017  . Venous stasis ulcer ([place]) 11/03/2017  . BMI 26.0-26.9,adult 11/15/2015  . Family history of malignant neoplasm of gastrointestinal tract 06/30/2013  . Hyperlipidemia 06/02/2013  . Diabetes mellitus ([place]) 03/29/2011  . Osteoporosis 03/29/2011  .  Iron deficiency anemia 12/29/2010  . Essential hypertension 09/08/2009  . Aortic valve disorder 09/08/2009  . Secondary cardiomyopathy ([place]) 09/08/2009   Past Medical History:  Diagnosis Date  . Aortic regurgitation    Mild  . Aortic stenosis    Moderate  . Coronary atherosclerosis of native coronary artery    Nonobstructive 2007  . Essential hypertension, benign   . Hyperlipidemia   . Hypothyroidism   . Nonischemic cardiomyopathy (HCC)    LVEF improved to 50-55%  . Osteoporosis   . Type 2 diabetes mellitus (HCC)     Family History    Problem Relation Age of Onset  . Colon cancer Mother   . Colon cancer Brother   . Diabetes Brother   . Prostate cancer Brother     Past Surgical History:  Procedure Laterality Date  . LOWER EXTREMITY ANGIOGRAPHY N/A 11/13/2017   Procedure: LOWER EXTREMITY ANGIOGRAPHY;  Surgeon: Serafina Mitchell, MD;  Location: [facility];  Service: Cardiovascular;  Laterality: N/A;  . SKIN GRAFT    . SKIN SPLIT GRAFT Right 11/09/2017   Procedure: RIGHT LEG DEBRIDE ACHILLES, SPLIT THICKNESS SKIN GRAFT, VAC;  Surgeon: Newt Minion, MD;  Location: [facility];  Service: Orthopedics;  Laterality: Right;   Social History   Occupational History    Employer: RETIRED  Tobacco Use  . Smoking status: Former Smoker    Packs/day: 0.50    Years: 30.00    Pack years: 15.00    Types: Cigarettes    Last attempt to quit: 12/25/1988    Years since quitting: 28.9  . Smokeless tobacco: Never Used  Substance and Sexual Activity  . Alcohol use: No    Alcohol/week: 0.0 oz  . Drug use: No  . Sexual activity: No

## 2017-12-13 NOTE — Addendum Note (Signed)
Addended by: Meridee Score on: 12/13/2017 10:33 AM   Modules accepted: Orders

## 2017-12-14 DIAGNOSIS — E11622 Type 2 diabetes mellitus with other skin ulcer: Secondary | ICD-10-CM | POA: Diagnosis not present

## 2017-12-14 DIAGNOSIS — L97822 Non-pressure chronic ulcer of other part of left lower leg with fat layer exposed: Secondary | ICD-10-CM | POA: Diagnosis not present

## 2017-12-14 DIAGNOSIS — L97213 Non-pressure chronic ulcer of right calf with necrosis of muscle: Secondary | ICD-10-CM | POA: Diagnosis not present

## 2017-12-14 DIAGNOSIS — Z48817 Encounter for surgical aftercare following surgery on the skin and subcutaneous tissue: Secondary | ICD-10-CM | POA: Diagnosis not present

## 2017-12-14 DIAGNOSIS — Z945 Skin transplant status: Secondary | ICD-10-CM | POA: Diagnosis not present

## 2017-12-14 DIAGNOSIS — Z4881 Encounter for surgical aftercare following surgery on specified body systems: Secondary | ICD-10-CM | POA: Diagnosis not present

## 2017-12-14 DIAGNOSIS — I87332 Chronic venous hypertension (idiopathic) with ulcer and inflammation of left lower extremity: Secondary | ICD-10-CM | POA: Diagnosis not present

## 2017-12-14 DIAGNOSIS — E1151 Type 2 diabetes mellitus with diabetic peripheral angiopathy without gangrene: Secondary | ICD-10-CM | POA: Diagnosis not present

## 2017-12-14 DIAGNOSIS — I2510 Atherosclerotic heart disease of native coronary artery without angina pectoris: Secondary | ICD-10-CM | POA: Diagnosis not present

## 2017-12-14 DIAGNOSIS — I70261 Atherosclerosis of native arteries of extremities with gangrene, right leg: Secondary | ICD-10-CM | POA: Diagnosis not present

## 2017-12-16 ENCOUNTER — Other Ambulatory Visit: Payer: Self-pay | Admitting: Nurse Practitioner

## 2017-12-16 DIAGNOSIS — E785 Hyperlipidemia, unspecified: Secondary | ICD-10-CM

## 2017-12-17 ENCOUNTER — Other Ambulatory Visit: Payer: Self-pay | Admitting: Nurse Practitioner

## 2017-12-17 DIAGNOSIS — E1151 Type 2 diabetes mellitus with diabetic peripheral angiopathy without gangrene: Secondary | ICD-10-CM | POA: Diagnosis not present

## 2017-12-17 DIAGNOSIS — L97213 Non-pressure chronic ulcer of right calf with necrosis of muscle: Secondary | ICD-10-CM | POA: Diagnosis not present

## 2017-12-17 DIAGNOSIS — I87332 Chronic venous hypertension (idiopathic) with ulcer and inflammation of left lower extremity: Secondary | ICD-10-CM | POA: Diagnosis not present

## 2017-12-17 DIAGNOSIS — I2510 Atherosclerotic heart disease of native coronary artery without angina pectoris: Secondary | ICD-10-CM | POA: Diagnosis not present

## 2017-12-17 DIAGNOSIS — L97822 Non-pressure chronic ulcer of other part of left lower leg with fat layer exposed: Secondary | ICD-10-CM | POA: Diagnosis not present

## 2017-12-17 DIAGNOSIS — I10 Essential (primary) hypertension: Secondary | ICD-10-CM

## 2017-12-17 DIAGNOSIS — Z4881 Encounter for surgical aftercare following surgery on specified body systems: Secondary | ICD-10-CM | POA: Diagnosis not present

## 2017-12-17 DIAGNOSIS — Z945 Skin transplant status: Secondary | ICD-10-CM | POA: Diagnosis not present

## 2017-12-17 DIAGNOSIS — Z48817 Encounter for surgical aftercare following surgery on the skin and subcutaneous tissue: Secondary | ICD-10-CM | POA: Diagnosis not present

## 2017-12-17 DIAGNOSIS — E11622 Type 2 diabetes mellitus with other skin ulcer: Secondary | ICD-10-CM | POA: Diagnosis not present

## 2017-12-17 DIAGNOSIS — I70261 Atherosclerosis of native arteries of extremities with gangrene, right leg: Secondary | ICD-10-CM | POA: Diagnosis not present

## 2017-12-20 DIAGNOSIS — Z945 Skin transplant status: Secondary | ICD-10-CM | POA: Diagnosis not present

## 2017-12-20 DIAGNOSIS — E11622 Type 2 diabetes mellitus with other skin ulcer: Secondary | ICD-10-CM | POA: Diagnosis not present

## 2017-12-20 DIAGNOSIS — L97822 Non-pressure chronic ulcer of other part of left lower leg with fat layer exposed: Secondary | ICD-10-CM | POA: Diagnosis not present

## 2017-12-20 DIAGNOSIS — L97213 Non-pressure chronic ulcer of right calf with necrosis of muscle: Secondary | ICD-10-CM | POA: Diagnosis not present

## 2017-12-20 DIAGNOSIS — I2510 Atherosclerotic heart disease of native coronary artery without angina pectoris: Secondary | ICD-10-CM | POA: Diagnosis not present

## 2017-12-20 DIAGNOSIS — Z48817 Encounter for surgical aftercare following surgery on the skin and subcutaneous tissue: Secondary | ICD-10-CM | POA: Diagnosis not present

## 2017-12-20 DIAGNOSIS — I87332 Chronic venous hypertension (idiopathic) with ulcer and inflammation of left lower extremity: Secondary | ICD-10-CM | POA: Diagnosis not present

## 2017-12-20 DIAGNOSIS — Z4881 Encounter for surgical aftercare following surgery on specified body systems: Secondary | ICD-10-CM | POA: Diagnosis not present

## 2017-12-20 DIAGNOSIS — I70261 Atherosclerosis of native arteries of extremities with gangrene, right leg: Secondary | ICD-10-CM | POA: Diagnosis not present

## 2017-12-20 DIAGNOSIS — E1151 Type 2 diabetes mellitus with diabetic peripheral angiopathy without gangrene: Secondary | ICD-10-CM | POA: Diagnosis not present

## 2017-12-24 ENCOUNTER — Other Ambulatory Visit: Payer: Self-pay | Admitting: Family Medicine

## 2017-12-24 DIAGNOSIS — L97213 Non-pressure chronic ulcer of right calf with necrosis of muscle: Secondary | ICD-10-CM | POA: Diagnosis not present

## 2017-12-24 DIAGNOSIS — E1151 Type 2 diabetes mellitus with diabetic peripheral angiopathy without gangrene: Secondary | ICD-10-CM | POA: Diagnosis not present

## 2017-12-24 DIAGNOSIS — E119 Type 2 diabetes mellitus without complications: Secondary | ICD-10-CM

## 2017-12-24 DIAGNOSIS — I2510 Atherosclerotic heart disease of native coronary artery without angina pectoris: Secondary | ICD-10-CM | POA: Diagnosis not present

## 2017-12-24 DIAGNOSIS — Z48817 Encounter for surgical aftercare following surgery on the skin and subcutaneous tissue: Secondary | ICD-10-CM | POA: Diagnosis not present

## 2017-12-24 DIAGNOSIS — E11622 Type 2 diabetes mellitus with other skin ulcer: Secondary | ICD-10-CM | POA: Diagnosis not present

## 2017-12-24 DIAGNOSIS — I87332 Chronic venous hypertension (idiopathic) with ulcer and inflammation of left lower extremity: Secondary | ICD-10-CM | POA: Diagnosis not present

## 2017-12-24 DIAGNOSIS — Z4881 Encounter for surgical aftercare following surgery on specified body systems: Secondary | ICD-10-CM | POA: Diagnosis not present

## 2017-12-24 DIAGNOSIS — Z945 Skin transplant status: Secondary | ICD-10-CM | POA: Diagnosis not present

## 2017-12-24 DIAGNOSIS — I70261 Atherosclerosis of native arteries of extremities with gangrene, right leg: Secondary | ICD-10-CM | POA: Diagnosis not present

## 2017-12-24 DIAGNOSIS — L97822 Non-pressure chronic ulcer of other part of left lower leg with fat layer exposed: Secondary | ICD-10-CM | POA: Diagnosis not present

## 2017-12-27 DIAGNOSIS — E11622 Type 2 diabetes mellitus with other skin ulcer: Secondary | ICD-10-CM | POA: Diagnosis not present

## 2017-12-27 DIAGNOSIS — L97213 Non-pressure chronic ulcer of right calf with necrosis of muscle: Secondary | ICD-10-CM | POA: Diagnosis not present

## 2017-12-27 DIAGNOSIS — L97822 Non-pressure chronic ulcer of other part of left lower leg with fat layer exposed: Secondary | ICD-10-CM | POA: Diagnosis not present

## 2017-12-27 DIAGNOSIS — Z4881 Encounter for surgical aftercare following surgery on specified body systems: Secondary | ICD-10-CM | POA: Diagnosis not present

## 2017-12-27 DIAGNOSIS — Z945 Skin transplant status: Secondary | ICD-10-CM | POA: Diagnosis not present

## 2017-12-27 DIAGNOSIS — I70261 Atherosclerosis of native arteries of extremities with gangrene, right leg: Secondary | ICD-10-CM | POA: Diagnosis not present

## 2017-12-27 DIAGNOSIS — I87332 Chronic venous hypertension (idiopathic) with ulcer and inflammation of left lower extremity: Secondary | ICD-10-CM | POA: Diagnosis not present

## 2017-12-27 DIAGNOSIS — E1151 Type 2 diabetes mellitus with diabetic peripheral angiopathy without gangrene: Secondary | ICD-10-CM | POA: Diagnosis not present

## 2017-12-27 DIAGNOSIS — I2510 Atherosclerotic heart disease of native coronary artery without angina pectoris: Secondary | ICD-10-CM | POA: Diagnosis not present

## 2017-12-27 DIAGNOSIS — I110 Hypertensive heart disease with heart failure: Secondary | ICD-10-CM | POA: Diagnosis not present

## 2017-12-27 DIAGNOSIS — Z48817 Encounter for surgical aftercare following surgery on the skin and subcutaneous tissue: Secondary | ICD-10-CM | POA: Diagnosis not present

## 2017-12-28 ENCOUNTER — Telehealth: Payer: Self-pay | Admitting: Nurse Practitioner

## 2017-12-28 ENCOUNTER — Encounter (INDEPENDENT_AMBULATORY_CARE_PROVIDER_SITE_OTHER): Payer: Self-pay | Admitting: Orthopedic Surgery

## 2017-12-28 ENCOUNTER — Ambulatory Visit: Payer: Medicare HMO | Admitting: Physician Assistant

## 2017-12-28 ENCOUNTER — Ambulatory Visit (INDEPENDENT_AMBULATORY_CARE_PROVIDER_SITE_OTHER): Payer: Medicare HMO | Admitting: Orthopedic Surgery

## 2017-12-28 DIAGNOSIS — I83012 Varicose veins of right lower extremity with ulcer of calf: Secondary | ICD-10-CM

## 2017-12-28 DIAGNOSIS — E11622 Type 2 diabetes mellitus with other skin ulcer: Secondary | ICD-10-CM

## 2017-12-28 DIAGNOSIS — L97909 Non-pressure chronic ulcer of unspecified part of unspecified lower leg with unspecified severity: Secondary | ICD-10-CM

## 2017-12-28 DIAGNOSIS — Z945 Skin transplant status: Secondary | ICD-10-CM

## 2017-12-28 DIAGNOSIS — L97215 Non-pressure chronic ulcer of right calf with muscle involvement without evidence of necrosis: Secondary | ICD-10-CM

## 2017-12-28 NOTE — Progress Notes (Signed)
Office Visit Note   Patient: Emily Wall           Date of Birth: 1936/04/24           MRN: [id number] Visit Date: 12/28/2017              Requested by: Chevis Pretty, [address] [place], [place] [address] PCP: Chevis Pretty, FNP  No chief complaint on file.     HPI: Patient presents in follow-up status post split-thickness skin graft and wound VAC therapy for a massive chronic necrotic ulceration over the Achilles and posterior aspect the right calf.  Patient is also had traumatic venous ulcers in the left lower extremity.  Patient is undergoing twice a week compressive wrap dressing changes with home health nursing.  Assessment & Plan: Visit Diagnoses:  1. S/P split thickness skin graft   2. Venous stasis ulcer of right calf with muscle involvement without evidence of necrosis with varicose veins ([place])   3. Diabetic leg ulcer ([place])     Plan: We will have the knee-high compression stocking for the left leg.  We will continue with the compression wraps for the right leg. [place] assisting with twice weekly wraps. Recommended the importance of walking to stimulate the calf muscle pump avoid standing avoid dependency of the leg.  Follow-Up Instructions: Return in about 3 weeks (around 01/18/2018).   Ortho Exam  Patient is alert, oriented, no adenopathy, well-dressed, normal affect, normal respiratory effort. Examination the left leg has venous stasis changes with one remaining ulcer medially, is 15 mm in diameter and 1 mm deep. Granulation in wound bed. there is no drainage no cellulitis.  Examination the right leg she has a thin layer of fibrinous exudative tissue over the skin graft. Improved granulation. The skin graft is level with the surrounding tissue.  There is no cellulitis no odor no signs of infection.  There is no drainage.  Imaging: No results found. No images are attached to the encounter.  Labs: Lab Results  Component Value Date   HGBA1C 8.1 (H) 11/03/2017   HGBA1C 6.7 02/15/2016   HGBA1C 7.2 11/15/2015   ESRSEDRATE 26 (H) 11/03/2017   CRP 1.8 (H) 11/03/2017   REPTSTATUS 11/08/2017 FINAL 11/03/2017   CULT NO GROWTH 5 DAYS 11/03/2017    @LABSALLVALUES (HGBA1)@  There is no height or weight on file to calculate BMI.  Orders:  No orders of the defined types were placed in this encounter.  No orders of the defined types were placed in this encounter.    Procedures: No procedures performed  Clinical Data: No additional findings.  ROS:  All other systems negative, except as noted in the HPI. Review of Systems  Constitutional: Negative for chills and fever.  Cardiovascular: Positive for leg swelling.  Skin: Positive for wound. Negative for color change and rash.    Objective: Vital Signs: There were no vitals taken for this visit.  Specialty Comments:  No specialty comments available.  PMFS History: Patient Active Problem List   Diagnosis Date Noted  . S/P split thickness skin graft 11/29/2017  . Diabetic leg ulcer ([place])   . Atherosclerosis of native arteries of extremities with gangrene, right leg ([place])   . Wound infection 11/03/2017  . Hypoalbuminemia 11/03/2017  . Venous stasis ulcer ([place]) 11/03/2017  . BMI 26.0-26.9,adult 11/15/2015  . Family history of malignant neoplasm of gastrointestinal tract 06/30/2013  . Hyperlipidemia 06/02/2013  . Diabetes mellitus ([place]) 03/29/2011  . Osteoporosis 03/29/2011  . Iron deficiency  anemia 12/29/2010  . Essential hypertension 09/08/2009  . Aortic valve disorder 09/08/2009  . Secondary cardiomyopathy ([place]) 09/08/2009   Past Medical History:  Diagnosis Date  . Aortic regurgitation    Mild  . Aortic stenosis    Moderate  . Coronary atherosclerosis of native coronary artery    Nonobstructive 2007  . Essential hypertension, benign   . Hyperlipidemia   . Hypothyroidism   . Nonischemic cardiomyopathy (HCC)    LVEF improved to 50-55%  .  Osteoporosis   . Type 2 diabetes mellitus (HCC)     Family History  Problem Relation Age of Onset  . Colon cancer Mother   . Colon cancer Brother   . Diabetes Brother   . Prostate cancer Brother     Past Surgical History:  Procedure Laterality Date  . LOWER EXTREMITY ANGIOGRAPHY N/A 11/13/2017   Procedure: LOWER EXTREMITY ANGIOGRAPHY;  Surgeon: Serafina Mitchell, MD;  Location: [facility];  Service: Cardiovascular;  Laterality: N/A;  . SKIN GRAFT    . SKIN SPLIT GRAFT Right 11/09/2017   Procedure: RIGHT LEG DEBRIDE ACHILLES, SPLIT THICKNESS SKIN GRAFT, VAC;  Surgeon: Newt Minion, MD;  Location: [facility];  Service: Orthopedics;  Laterality: Right;   Social History   Occupational History    Employer: RETIRED  Tobacco Use  . Smoking status: Former Smoker    Packs/day: 0.50    Years: 30.00    Pack years: 15.00    Types: Cigarettes    Last attempt to quit: 12/25/1988    Years since quitting: 29.0  . Smokeless tobacco: Never Used  Substance and Sexual Activity  . Alcohol use: No    Alcohol/week: 0.0 oz  . Drug use: No  . Sexual activity: No

## 2017-12-28 NOTE — Telephone Encounter (Signed)
Pt aware we are currently out of Januvia samples.

## 2017-12-31 DIAGNOSIS — Z945 Skin transplant status: Secondary | ICD-10-CM | POA: Diagnosis not present

## 2017-12-31 DIAGNOSIS — Z4881 Encounter for surgical aftercare following surgery on specified body systems: Secondary | ICD-10-CM | POA: Diagnosis not present

## 2017-12-31 DIAGNOSIS — L97213 Non-pressure chronic ulcer of right calf with necrosis of muscle: Secondary | ICD-10-CM | POA: Diagnosis not present

## 2017-12-31 DIAGNOSIS — I2510 Atherosclerotic heart disease of native coronary artery without angina pectoris: Secondary | ICD-10-CM | POA: Diagnosis not present

## 2017-12-31 DIAGNOSIS — I70261 Atherosclerosis of native arteries of extremities with gangrene, right leg: Secondary | ICD-10-CM | POA: Diagnosis not present

## 2017-12-31 DIAGNOSIS — I87332 Chronic venous hypertension (idiopathic) with ulcer and inflammation of left lower extremity: Secondary | ICD-10-CM | POA: Diagnosis not present

## 2017-12-31 DIAGNOSIS — E1151 Type 2 diabetes mellitus with diabetic peripheral angiopathy without gangrene: Secondary | ICD-10-CM | POA: Diagnosis not present

## 2017-12-31 DIAGNOSIS — I110 Hypertensive heart disease with heart failure: Secondary | ICD-10-CM | POA: Diagnosis not present

## 2017-12-31 DIAGNOSIS — Z48817 Encounter for surgical aftercare following surgery on the skin and subcutaneous tissue: Secondary | ICD-10-CM | POA: Diagnosis not present

## 2017-12-31 DIAGNOSIS — L97822 Non-pressure chronic ulcer of other part of left lower leg with fat layer exposed: Secondary | ICD-10-CM | POA: Diagnosis not present

## 2017-12-31 DIAGNOSIS — E11622 Type 2 diabetes mellitus with other skin ulcer: Secondary | ICD-10-CM | POA: Diagnosis not present

## 2018-01-03 ENCOUNTER — Telehealth (INDEPENDENT_AMBULATORY_CARE_PROVIDER_SITE_OTHER): Payer: Self-pay | Admitting: Orthopedic Surgery

## 2018-01-03 ENCOUNTER — Ambulatory Visit (INDEPENDENT_AMBULATORY_CARE_PROVIDER_SITE_OTHER): Payer: Medicare HMO

## 2018-01-03 ENCOUNTER — Encounter: Payer: Self-pay | Admitting: Nurse Practitioner

## 2018-01-03 ENCOUNTER — Ambulatory Visit (INDEPENDENT_AMBULATORY_CARE_PROVIDER_SITE_OTHER): Payer: Medicare HMO | Admitting: Nurse Practitioner

## 2018-01-03 VITALS — BP 122/74 | HR 81 | Temp 97.4°F | Ht 65.0 in | Wt 187.0 lb

## 2018-01-03 DIAGNOSIS — I70261 Atherosclerosis of native arteries of extremities with gangrene, right leg: Secondary | ICD-10-CM | POA: Diagnosis not present

## 2018-01-03 DIAGNOSIS — I110 Hypertensive heart disease with heart failure: Secondary | ICD-10-CM | POA: Diagnosis not present

## 2018-01-03 DIAGNOSIS — R635 Abnormal weight gain: Secondary | ICD-10-CM

## 2018-01-03 DIAGNOSIS — R0600 Dyspnea, unspecified: Secondary | ICD-10-CM

## 2018-01-03 DIAGNOSIS — Z4881 Encounter for surgical aftercare following surgery on specified body systems: Secondary | ICD-10-CM | POA: Diagnosis not present

## 2018-01-03 DIAGNOSIS — I5040 Unspecified combined systolic (congestive) and diastolic (congestive) heart failure: Secondary | ICD-10-CM | POA: Diagnosis not present

## 2018-01-03 DIAGNOSIS — I2510 Atherosclerotic heart disease of native coronary artery without angina pectoris: Secondary | ICD-10-CM | POA: Diagnosis not present

## 2018-01-03 DIAGNOSIS — E11622 Type 2 diabetes mellitus with other skin ulcer: Secondary | ICD-10-CM | POA: Diagnosis not present

## 2018-01-03 DIAGNOSIS — E1151 Type 2 diabetes mellitus with diabetic peripheral angiopathy without gangrene: Secondary | ICD-10-CM | POA: Diagnosis not present

## 2018-01-03 DIAGNOSIS — R609 Edema, unspecified: Secondary | ICD-10-CM | POA: Diagnosis not present

## 2018-01-03 DIAGNOSIS — Z48817 Encounter for surgical aftercare following surgery on the skin and subcutaneous tissue: Secondary | ICD-10-CM | POA: Diagnosis not present

## 2018-01-03 DIAGNOSIS — L97213 Non-pressure chronic ulcer of right calf with necrosis of muscle: Secondary | ICD-10-CM | POA: Diagnosis not present

## 2018-01-03 DIAGNOSIS — Z945 Skin transplant status: Secondary | ICD-10-CM | POA: Diagnosis not present

## 2018-01-03 DIAGNOSIS — R0602 Shortness of breath: Secondary | ICD-10-CM | POA: Diagnosis not present

## 2018-01-03 DIAGNOSIS — L97822 Non-pressure chronic ulcer of other part of left lower leg with fat layer exposed: Secondary | ICD-10-CM | POA: Diagnosis not present

## 2018-01-03 DIAGNOSIS — I87332 Chronic venous hypertension (idiopathic) with ulcer and inflammation of left lower extremity: Secondary | ICD-10-CM | POA: Diagnosis not present

## 2018-01-03 MED ORDER — FUROSEMIDE 40 MG PO TABS: 40.0000 mg | ORAL_TABLET | Freq: Every day | ORAL | 3 refills | Status: DC

## 2018-01-03 NOTE — Patient Instructions (Signed)
Heart Failure Heart failure is a condition in which the heart has trouble pumping blood because it has become weak or stiff. This means that the heart does not pump blood efficiently for the body to work well. For some people with heart failure, fluid may back up into the lungs and there may be swelling (edema) in the lower legs. Heart failure is usually a long-term (chronic) condition. It is important for you to take good care of yourself and follow the treatment plan from your health care provider. What are the causes? This condition is caused by some health problems, including:  High blood pressure (hypertension). Hypertension causes the heart muscle to work harder than normal. High blood pressure eventually causes the heart to become stiff and weak.  Coronary artery disease (CAD). CAD is the buildup of cholesterol and fat (plaques) in the arteries of the heart.  Heart attack (myocardial infarction). Injured tissue, which is caused by the heart attack, does not contract as well and the heart's ability to pump blood is weakened.  Abnormal heart valves. When the heart valves do not open and close properly, the heart muscle must pump harder to keep the blood flowing.  Heart muscle disease (cardiomyopathy or myocarditis). Heart muscle disease is damage to the heart muscle from a variety of causes, such as drug or alcohol abuse, infections, or unknown causes. These can increase the risk of heart failure.  Lung disease. When the lungs do not work properly, the heart must work harder.  What increases the risk? Risk of heart failure increases as a person ages. This condition is also more likely to develop in people who:  Are overweight.  Are female.  Smoke or chew tobacco.  Abuse alcohol or illegal drugs.  Have taken medicines that can damage the heart, such as chemotherapy drugs.  Have diabetes. ? High blood sugar (glucose) is associated with high fat (lipid) levels in the blood. ? Diabetes  can also damage tiny blood vessels that carry nutrients to the heart muscle.  Have abnormal heart rhythms.  Have thyroid problems.  Have low blood counts (anemia).  What are the signs or symptoms? Symptoms of this condition include:  Shortness of breath with activity, such as when climbing stairs.  Persistent cough.  Swelling of the feet, ankles, legs, or abdomen.  Unexplained weight gain.  Difficulty breathing when lying flat (orthopnea).  Waking from sleep because of the need to sit up and get more air.  Rapid heartbeat.  Fatigue and loss of energy.  Feeling light-headed, dizzy, or close to fainting.  Loss of appetite.  Nausea.  Increased urination during the night (nocturia).  Confusion.  How is this diagnosed? This condition is diagnosed based on:  Medical history, symptoms, and a physical exam.  Diagnostic tests, which may include: ? Echocardiogram. ? Electrocardiogram (ECG). ? Chest X-ray. ? Blood tests. ? Exercise stress test. ? Radionuclide scans. ? Cardiac catheterization and angiogram.  How is this treated? Treatment for this condition is aimed at managing the symptoms of heart failure. Medicines, behavioral changes, or other treatments may be necessary to treat heart failure. Medicines These may include:  Angiotensin-converting enzyme (ACE) inhibitors. This type of medicine blocks the effects of a blood protein called angiotensin-converting enzyme. ACE inhibitors relax (dilate) the blood vessels and help to lower blood pressure.  Angiotensin receptor blockers (ARBs). This type of medicine blocks the actions of a blood protein called angiotensin. ARBs dilate the blood vessels and help to lower blood pressure.  Water   pills (diuretics). Diuretics cause the kidneys to remove salt and water from the blood. The extra fluid is removed through urination, leaving a lower volume of blood that the heart has to pump.  Beta blockers. These improve heart  muscle strength and they prevent the heart from beating too quickly.  Digoxin. This increases the force of the heartbeat.  Healthy behavior changes These may include:  Reaching and maintaining a healthy weight.  Stopping smoking or chewing tobacco.  Eating heart-healthy foods.  Limiting or avoiding alcohol.  Stopping use of street drugs (illegal drugs).  Physical activity.  Other treatments These may include:  Surgery to open blocked coronary arteries or repair damaged heart valves.  Placement of a biventricular pacemaker to improve heart muscle function (cardiac resynchronization therapy). This device paces both the right ventricle and left ventricle.  Placement of a device to treat serious abnormal heart rhythms (implantable cardioverter defibrillator, or ICD).  Placement of a device to improve the pumping ability of the heart (left ventricular assist device, or LVAD).  Heart transplant. This can cure heart failure, and it is considered for certain patients who do not improve with other therapies.  Follow these instructions at home: Medicines  Take over-the-counter and prescription medicines only as told by your health care provider. Medicines are important in reducing the workload of your heart, slowing the progression of heart failure, and improving your symptoms. ? Do not stop taking your medicine unless your health care provider told you to do that. ? Do not skip any dose of medicine. ? Refill your prescriptions before you run out of medicine. You need your medicines every day. Eating and drinking   Eat heart-healthy foods. Talk with a dietitian to make an eating plan that is right for you. ? Choose foods that contain no trans fat and are low in saturated fat and cholesterol. Healthy choices include fresh or frozen fruits and vegetables, fish, lean meats, legumes, fat-free or low-fat dairy products, and whole-grain or high-fiber foods. ? Limit salt (sodium) if  directed by your health care provider. Sodium restriction may reduce symptoms of heart failure. Ask a dietitian to recommend heart-healthy seasonings. ? Use healthy cooking methods instead of frying. Healthy methods include roasting, grilling, broiling, baking, poaching, steaming, and stir-frying.  Limit your fluid intake if directed by your health care provider. Fluid restriction may reduce symptoms of heart failure. Lifestyle  Stop smoking or using chewing tobacco. Nicotine and tobacco can damage your heart and your blood vessels. Do not use nicotine gum or patches before talking to your health care provider.  Limit alcohol intake to no more than 1 drink per day for non-pregnant women and 2 drinks per day for men. One drink equals 12 oz of beer, 5 oz of wine, or 1 oz of hard liquor. ? Drinking more than that is harmful to your heart. Tell your health care provider if you drink alcohol several times a week. ? Talk with your health care provider about whether any level of alcohol use is safe for you. ? If your heart has already been damaged by alcohol or you have severe heart failure, drinking alcohol should be stopped completely.  Stop use of illegal drugs.  Lose weight if directed by your health care provider. Weight loss may reduce symptoms of heart failure.  Do moderate physical activity if directed by your health care provider. People who are elderly and people with severe heart failure should consult with a health care provider for physical activity recommendations.   Monitor important information  Weigh yourself every day. Keeping track of your weight daily helps you to notice excess fluid sooner. ? Weigh yourself every morning after you urinate and before you eat breakfast. ? Wear the same amount of clothing each time you weigh yourself. ? Record your daily weight. Provide your health care provider with your weight record.  Monitor and record your blood pressure as told by your health  care provider.  Check your pulse as told by your health care provider. Dealing with extreme temperatures  If the weather is extremely hot: ? Avoid vigorous physical activity. ? Use air conditioning or fans or seek a cooler location. ? Avoid caffeine and alcohol. ? Wear loose-fitting, lightweight, and light-colored clothing.  If the weather is extremely cold: ? Avoid vigorous physical activity. ? Layer your clothes. ? Wear mittens or gloves, a hat, and a scarf when you go outside. ? Avoid alcohol. General instructions  Manage other health conditions such as hypertension, diabetes, thyroid disease, or abnormal heart rhythms as told by your health care provider.  Learn to manage stress. If you need help to do this, ask your health care provider.  Plan rest periods when fatigued.  Get ongoing education and support as needed.  Participate in or seek rehabilitation as needed to maintain or improve independence and quality of life.  Stay up to date with immunizations. Keeping current on pneumococcal and influenza immunizations is especially important to prevent respiratory infections.  Keep all follow-up visits as told by your health care provider. This is important. Contact a health care provider if:  You have a rapid weight gain.  You have increasing shortness of breath that is unusual for you.  You are unable to participate in your usual physical activities.  You tire easily.  You cough more than normal, especially with physical activity.  You have any swelling or more swelling in areas such as your hands, feet, ankles, or abdomen.  You are unable to sleep because it is hard to breathe.  You feel like your heart is beating quickly (palpitations).  You become dizzy or light-headed when you stand up. Get help right away if:  You have difficulty breathing.  You notice or your family notices a change in your awareness, such as having trouble staying awake or having  difficulty with concentration.  You have pain or discomfort in your chest.  You have an episode of fainting (syncope). This information is not intended to replace advice given to you by your health care provider. Make sure you discuss any questions you have with your health care provider. Document Released: 12/11/2005 Document Revised: 08/15/2016 Document Reviewed: 07/05/2016 Elsevier Interactive Patient Education  2018 Elsevier Inc.  

## 2018-01-03 NOTE — Telephone Encounter (Signed)
[facility] nurse states shes called multiple times needing wound care orders because patient has small open areas in her left leg. CB to advise # [phone]

## 2018-01-03 NOTE — Progress Notes (Signed)
   Subjective:    Patient ID: Emily Wall, female    DOB: 1936/02/27, 82 y.o.   MRN: [id number]  HPI Patient come sin accompanied by her daughter. She is c/o cough and congestion. Dyspnea on exertion. She also has 30lb weight gain since November. Her daughter says that since hr leg surgery she has been less active and has been eating lot.   Review of Systems  Constitutional: Negative for chills and fever.  HENT: Positive for congestion and rhinorrhea. Negative for ear pain and trouble swallowing.   Respiratory: Positive for cough and shortness of breath.   Cardiovascular: Negative.   Gastrointestinal: Negative.   Genitourinary: Negative.   Neurological: Negative.   Psychiatric/Behavioral: Negative.   All other systems reviewed and are negative.      Objective:   Physical Exam  Constitutional: She appears well-developed and well-nourished. No distress.  Cardiovascular: Normal rate and regular rhythm.  Pulmonary/Chest: Effort normal.  Diminished breath sounds throughout  Abdominal: Soft. Bowel sounds are normal. She exhibits no distension and no mass. There is no tenderness. There is no rebound and no guarding.  Musculoskeletal: She exhibits edema (2-3 + pitting edema bil lower ext- coban wraps bil ower ext).  Skin: Skin is warm.  Psychiatric: She has a normal mood and affect. Her behavior is normal. Judgment and thought content normal.   BP 122/74   Pulse 81   Temp (!) 97.4 F (36.3 C) (Oral)   Ht 5\' 5"  (1.651 m)   Wt 187 lb (84.8 kg)   BMI 31.12 kg/m   Chest xray - congestive heart failure-Preliminary reading by Ronnald Collum, FNP  [facility]     Assessment & Plan:   1. Dyspnea, unspecified type   2. Weight gain   3. Peripheral edema   4. Combined systolic and diastolic congestive heart failure, unspecified HF chronicity (HCC)    Increased lasix to 40mg  daily- would like for her to take extra 2omg tabket at 1pm daily till Monday Follow up Tuesday Limit fluids in  diet  [place], FNP

## 2018-01-04 NOTE — Telephone Encounter (Signed)
Please advise 

## 2018-01-04 NOTE — Telephone Encounter (Signed)
[facility] nurse and she stated that the patient had new ulcerations on the left lower extremity. I advised per Erin's message that patient needs ROV to evaluate leg. She will have patient call to schedule appt.

## 2018-01-07 DIAGNOSIS — I87332 Chronic venous hypertension (idiopathic) with ulcer and inflammation of left lower extremity: Secondary | ICD-10-CM | POA: Diagnosis not present

## 2018-01-07 DIAGNOSIS — I110 Hypertensive heart disease with heart failure: Secondary | ICD-10-CM | POA: Diagnosis not present

## 2018-01-07 DIAGNOSIS — Z4881 Encounter for surgical aftercare following surgery on specified body systems: Secondary | ICD-10-CM | POA: Diagnosis not present

## 2018-01-07 DIAGNOSIS — E11622 Type 2 diabetes mellitus with other skin ulcer: Secondary | ICD-10-CM | POA: Diagnosis not present

## 2018-01-07 DIAGNOSIS — L97822 Non-pressure chronic ulcer of other part of left lower leg with fat layer exposed: Secondary | ICD-10-CM | POA: Diagnosis not present

## 2018-01-07 DIAGNOSIS — L97213 Non-pressure chronic ulcer of right calf with necrosis of muscle: Secondary | ICD-10-CM | POA: Diagnosis not present

## 2018-01-07 DIAGNOSIS — I2510 Atherosclerotic heart disease of native coronary artery without angina pectoris: Secondary | ICD-10-CM | POA: Diagnosis not present

## 2018-01-07 DIAGNOSIS — I70261 Atherosclerosis of native arteries of extremities with gangrene, right leg: Secondary | ICD-10-CM | POA: Diagnosis not present

## 2018-01-07 DIAGNOSIS — E1151 Type 2 diabetes mellitus with diabetic peripheral angiopathy without gangrene: Secondary | ICD-10-CM | POA: Diagnosis not present

## 2018-01-07 DIAGNOSIS — Z48817 Encounter for surgical aftercare following surgery on the skin and subcutaneous tissue: Secondary | ICD-10-CM | POA: Diagnosis not present

## 2018-01-07 DIAGNOSIS — Z945 Skin transplant status: Secondary | ICD-10-CM | POA: Diagnosis not present

## 2018-01-08 ENCOUNTER — Encounter: Payer: Self-pay | Admitting: Nurse Practitioner

## 2018-01-08 ENCOUNTER — Ambulatory Visit (INDEPENDENT_AMBULATORY_CARE_PROVIDER_SITE_OTHER): Payer: Medicare HMO | Admitting: Nurse Practitioner

## 2018-01-08 ENCOUNTER — Other Ambulatory Visit: Payer: Self-pay | Admitting: Nurse Practitioner

## 2018-01-08 VITALS — BP 127/71 | HR 87 | Temp 97.4°F | Ht 65.0 in | Wt 181.0 lb

## 2018-01-08 DIAGNOSIS — R609 Edema, unspecified: Secondary | ICD-10-CM

## 2018-01-08 DIAGNOSIS — I10 Essential (primary) hypertension: Secondary | ICD-10-CM

## 2018-01-08 NOTE — Patient Instructions (Signed)
Edema Edema is when you have too much fluid in your body or under your skin. Edema may make your legs, feet, and ankles swell up. Swelling is also common in looser tissues, like around your eyes. This is a common condition. It gets more common as you get older. There are many possible causes of edema. Eating too much salt (sodium) and being on your feet or sitting for a long time can cause edema in your legs, feet, and ankles. Hot weather may make edema worse. Edema is usually painless. Your skin may look swollen or shiny. Follow these instructions at home:  Keep the swollen body part raised (elevated) above the level of your heart when you are sitting or lying down.  Do not sit still or stand for a long time.  Do not wear tight clothes. Do not wear garters on your upper legs.  Exercise your legs. This can help the swelling go down.  Wear elastic bandages or support stockings as told by your doctor.  Eat a low-salt (low-sodium) diet to reduce fluid as told by your doctor.  Depending on the cause of your swelling, you may need to limit how much fluid you drink (fluid restriction).  Take over-the-counter and prescription medicines only as told by your doctor. Contact a doctor if:  Treatment is not working.  You have heart, liver, or kidney disease and have symptoms of edema.  You have sudden and unexplained weight gain. Get help right away if:  You have shortness of breath or chest pain.  You cannot breathe when you lie down.  You have pain, redness, or warmth in the swollen areas.  You have heart, liver, or kidney disease and get edema all of a sudden.  You have a fever and your symptoms get worse all of a sudden. Summary  Edema is when you have too much fluid in your body or under your skin.  Edema may make your legs, feet, and ankles swell up. Swelling is also common in looser tissues, like around your eyes.  Raise (elevate) the swollen body part above the level of your  heart when you are sitting or lying down.  Follow your doctor's instructions about diet and how much fluid you can drink (fluid restriction). This information is not intended to replace advice given to you by your health care provider. Make sure you discuss any questions you have with your health care provider. Document Released: 05/29/2008 Document Revised: 12/29/2016 Document Reviewed: 12/29/2016 Elsevier Interactive Patient Education  2017 Elsevier Inc.  

## 2018-01-08 NOTE — Progress Notes (Signed)
   Subjective:    Patient ID: Emily Wall, female    DOB: 1936-05-07, 82 y.o.   MRN: [id number]  HPI  Patient in today for follow up. She was seen on 01/03/18 with dyspnea. Had lots of crackles in her lungs and we increased her fluid pill from 20mg  daily to 40mg  in morning and an extra 2)mg in afternoon.  Her daughter brings her to the office today. Patient has lost 7 lbs. Does not feel as swollen. deneis lower ext edema.   Review of Systems  Constitutional: Negative.   Respiratory: Negative.   Cardiovascular: Negative.  Negative for leg swelling.  Neurological: Negative.   Psychiatric/Behavioral: Negative.   All other systems reviewed and are negative.      Objective:   Physical Exam  Constitutional: She is oriented to person, place, and time. She appears well-developed and well-nourished. No distress.  Cardiovascular: Normal rate and regular rhythm.  Pulmonary/Chest: Effort normal and breath sounds normal. She has no rales.  Musculoskeletal: She exhibits edema (1= EDEMA).  Neurological: She is alert and oriented to person, place, and time.  Skin: Skin is warm.  Psychiatric: She has a normal mood and affect. Her behavior is normal. Judgment and thought content normal.    BP 127/71   Pulse 87   Temp (!) 97.4 F (36.3 C) (Oral)   Ht 5\' 5"  (1.651 m)   Wt 181 lb (82.1 kg)   BMI 30.12 kg/m        Assessment & Plan:   1. Fluid retention    CONTINUE lasix 40mg  daily RTO prn  Mary-Margaret Hassell Done, FNP

## 2018-01-09 LAB — CMP14+EGFR
A/G RATIO: 0.9 — AB (ref 1.2–2.2)
ALT: 8 IU/L (ref 0–32)
AST: 10 IU/L (ref 0–40)
Albumin: 2.8 g/dL — ABNORMAL LOW (ref 3.5–4.8)
Alkaline Phosphatase: 52 IU/L (ref 39–117)
BUN/Creatinine Ratio: 12 (ref 12–28)
BUN: 7 mg/dL — AB (ref 8–27)
Bilirubin Total: 0.4 mg/dL (ref 0.0–1.2)
CALCIUM: 8.3 mg/dL — AB (ref 8.7–10.3)
CO2: 27 mmol/L (ref 20–29)
CREATININE: 0.6 mg/dL (ref 0.57–1.00)
Chloride: 97 mmol/L (ref 96–106)
GFR, EST AFRICAN AMERICAN: 100 mL/min/{1.73_m2} (ref >59)
GFR, EST NON AFRICAN AMERICAN: 87 mL/min/{1.73_m2} (ref >59)
GLUCOSE: 145 mg/dL — AB (ref 65–99)
Globulin, Total: 3.2 g/dL (ref 1.5–4.5)
Potassium: 3.5 mmol/L (ref 3.5–5.2)
Sodium: 137 mmol/L (ref 134–144)
TOTAL PROTEIN: 6 g/dL (ref 6.0–8.5)

## 2018-01-09 LAB — BRAIN NATRIURETIC PEPTIDE: BNP: 2294.7 pg/mL — AB (ref 0.0–100.0)

## 2018-01-10 ENCOUNTER — Ambulatory Visit (INDEPENDENT_AMBULATORY_CARE_PROVIDER_SITE_OTHER): Payer: Medicare HMO | Admitting: Orthopedic Surgery

## 2018-01-10 ENCOUNTER — Encounter (INDEPENDENT_AMBULATORY_CARE_PROVIDER_SITE_OTHER): Payer: Self-pay | Admitting: Orthopedic Surgery

## 2018-01-10 VITALS — Ht 65.0 in | Wt 181.0 lb

## 2018-01-10 DIAGNOSIS — I83028 Varicose veins of left lower extremity with ulcer other part of lower leg: Secondary | ICD-10-CM

## 2018-01-10 DIAGNOSIS — L97909 Non-pressure chronic ulcer of unspecified part of unspecified lower leg with unspecified severity: Secondary | ICD-10-CM

## 2018-01-10 DIAGNOSIS — I83012 Varicose veins of right lower extremity with ulcer of calf: Secondary | ICD-10-CM

## 2018-01-10 DIAGNOSIS — E11622 Type 2 diabetes mellitus with other skin ulcer: Secondary | ICD-10-CM

## 2018-01-10 DIAGNOSIS — Z945 Skin transplant status: Secondary | ICD-10-CM

## 2018-01-10 DIAGNOSIS — L97215 Non-pressure chronic ulcer of right calf with muscle involvement without evidence of necrosis: Secondary | ICD-10-CM

## 2018-01-10 MED ORDER — OXYCODONE-ACETAMINOPHEN 5-325 MG PO TABS: 1.0000 | ORAL_TABLET | Freq: Two times a day (BID) | ORAL | 0 refills | Status: DC | PRN

## 2018-01-10 NOTE — Progress Notes (Signed)
Office Visit Note   Patient: Emily Wall           Date of Birth: [birth year]           MRN: [id number] Visit Date: 01/10/2018              Requested by: Chevis Pretty, [address] [place], [place] [address] PCP: Chevis Pretty, FNP  Chief Complaint  Patient presents with  . Right Foot - Routine Post Op    11/09/17 right achilles debridement and STSG application       HPI: Patient presents in follow-up status post split-thickness skin graft and wound VAC therapy for a massive chronic necrotic ulceration over the Achilles and posterior aspect the right calf.  Patient is also had traumatic venous ulcers in the left lower extremity.  Patient is undergoing twice a week compressive wrap dressing, Dynaflex, changes with home health nursing.  Admits to rolling and pushing wrap down when compression causes pain. Currently has new ulcers to LLE. Not wearing any comrpession to this leg at current.  States has had a 30# weight gain and has had to have her Lasix increased. Has lost 8 pounds in last week with increased lasix to 40 mg.   Assessment & Plan: Visit Diagnoses:  No diagnosis found.  Plan: We will have the unna boots applied to both LEs. [place] assisting with twice weekly wraps. Recommended the importance of walking to stimulate the calf muscle pump avoid standing avoid dependency of the leg.  Follow-Up Instructions: No Follow-up on file.   Ortho Exam  Patient is alert, oriented, no adenopathy, well-dressed, normal affect, normal respiratory effort. Examination the left leg has venous stasis changes with 3 ulcerative areas medially, largest is 2 cm x 5 mm and 1 mm deep. Pitting edema to LLE. Granulation in wound bed. there is no drainage no cellulitis.  Examination the right leg she has a thin layer of fibrinous exudative tissue over the skin graft. 60% granulation. The skin graft is level with the surrounding tissue.  There is no cellulitis no odor no signs  of infection.  There is no drainage. Does have some wrinkling of skin proximally from wrap, pitting despite compression.   Imaging: No results found. No images are attached to the encounter.  Labs: Lab Results  Component Value Date   HGBA1C 8.1 (H) 11/03/2017   HGBA1C 6.7 02/15/2016   HGBA1C 7.2 11/15/2015   ESRSEDRATE 26 (H) 11/03/2017   CRP 1.8 (H) 11/03/2017   REPTSTATUS 11/08/2017 FINAL 11/03/2017   CULT NO GROWTH 5 DAYS 11/03/2017    @LABSALLVALUES (HGBA1)@  Body mass index is 30.12 kg/m.  Orders:  No orders of the defined types were placed in this encounter.  No orders of the defined types were placed in this encounter.    Procedures: No procedures performed  Clinical Data: No additional findings.  ROS:  All other systems negative, except as noted in the HPI. Review of Systems  Constitutional: Negative for chills and fever.  Cardiovascular: Positive for leg swelling.  Skin: Positive for wound. Negative for color change and rash.    Objective: Vital Signs: Ht 5\' 5"  (1.651 m)   Wt 181 lb (82.1 kg)   BMI 30.12 kg/m   Specialty Comments:  No specialty comments available.  PMFS History: Patient Active Problem List   Diagnosis Date Noted  . S/P split thickness skin graft 11/29/2017  . Diabetic leg ulcer ([place])   . Atherosclerosis of native arteries of extremities with  gangrene, right leg ([place])   . Wound infection 11/03/2017  . Hypoalbuminemia 11/03/2017  . Venous stasis ulcer ([place]) 11/03/2017  . BMI 26.0-26.9,adult 11/15/2015  . Family history of malignant neoplasm of gastrointestinal tract 06/30/2013  . Hyperlipidemia 06/02/2013  . Diabetes mellitus ([place]) 03/29/2011  . Osteoporosis 03/29/2011  . Iron deficiency anemia 12/29/2010  . Essential hypertension 09/08/2009  . Aortic valve disorder 09/08/2009  . Secondary cardiomyopathy ([place]) 09/08/2009   Past Medical History:  Diagnosis Date  . Aortic regurgitation    Mild  . Aortic stenosis     Moderate  . Coronary atherosclerosis of native coronary artery    Nonobstructive 2007  . Essential hypertension, benign   . Hyperlipidemia   . Hypothyroidism   . Nonischemic cardiomyopathy (HCC)    LVEF improved to 50-55%  . Osteoporosis   . Type 2 diabetes mellitus (HCC)     Family History  Problem Relation Age of Onset  . Colon cancer Mother   . Colon cancer Brother   . Diabetes Brother   . Prostate cancer Brother     Past Surgical History:  Procedure Laterality Date  . LOWER EXTREMITY ANGIOGRAPHY N/A 11/13/2017   Procedure: LOWER EXTREMITY ANGIOGRAPHY;  Surgeon: Serafina Mitchell, MD;  Location: [facility];  Service: Cardiovascular;  Laterality: N/A;  . SKIN GRAFT    . SKIN SPLIT GRAFT Right 11/09/2017   Procedure: RIGHT LEG DEBRIDE ACHILLES, SPLIT THICKNESS SKIN GRAFT, VAC;  Surgeon: Newt Minion, MD;  Location: [facility];  Service: Orthopedics;  Laterality: Right;   Social History   Occupational History    Employer: RETIRED  Tobacco Use  . Smoking status: Former Smoker    Packs/day: 0.50    Years: 30.00    Pack years: 15.00    Types: Cigarettes    Last attempt to quit: 12/25/1988    Years since quitting: 29.0  . Smokeless tobacco: Never Used  Substance and Sexual Activity  . Alcohol use: No    Alcohol/week: 0.0 oz  . Drug use: No  . Sexual activity: No

## 2018-01-11 DIAGNOSIS — E1151 Type 2 diabetes mellitus with diabetic peripheral angiopathy without gangrene: Secondary | ICD-10-CM | POA: Diagnosis not present

## 2018-01-11 DIAGNOSIS — I70261 Atherosclerosis of native arteries of extremities with gangrene, right leg: Secondary | ICD-10-CM | POA: Diagnosis not present

## 2018-01-11 DIAGNOSIS — Z4881 Encounter for surgical aftercare following surgery on specified body systems: Secondary | ICD-10-CM | POA: Diagnosis not present

## 2018-01-11 DIAGNOSIS — I2510 Atherosclerotic heart disease of native coronary artery without angina pectoris: Secondary | ICD-10-CM | POA: Diagnosis not present

## 2018-01-11 DIAGNOSIS — L97213 Non-pressure chronic ulcer of right calf with necrosis of muscle: Secondary | ICD-10-CM | POA: Diagnosis not present

## 2018-01-11 DIAGNOSIS — Z48817 Encounter for surgical aftercare following surgery on the skin and subcutaneous tissue: Secondary | ICD-10-CM | POA: Diagnosis not present

## 2018-01-11 DIAGNOSIS — I110 Hypertensive heart disease with heart failure: Secondary | ICD-10-CM | POA: Diagnosis not present

## 2018-01-11 DIAGNOSIS — Z945 Skin transplant status: Secondary | ICD-10-CM | POA: Diagnosis not present

## 2018-01-11 DIAGNOSIS — E11622 Type 2 diabetes mellitus with other skin ulcer: Secondary | ICD-10-CM | POA: Diagnosis not present

## 2018-01-11 DIAGNOSIS — L97822 Non-pressure chronic ulcer of other part of left lower leg with fat layer exposed: Secondary | ICD-10-CM | POA: Diagnosis not present

## 2018-01-11 DIAGNOSIS — I87332 Chronic venous hypertension (idiopathic) with ulcer and inflammation of left lower extremity: Secondary | ICD-10-CM | POA: Diagnosis not present

## 2018-01-14 ENCOUNTER — Telehealth (INDEPENDENT_AMBULATORY_CARE_PROVIDER_SITE_OTHER): Payer: Self-pay | Admitting: Orthopedic Surgery

## 2018-01-14 DIAGNOSIS — E11622 Type 2 diabetes mellitus with other skin ulcer: Secondary | ICD-10-CM | POA: Diagnosis not present

## 2018-01-14 DIAGNOSIS — I70261 Atherosclerosis of native arteries of extremities with gangrene, right leg: Secondary | ICD-10-CM | POA: Diagnosis not present

## 2018-01-14 DIAGNOSIS — Z48817 Encounter for surgical aftercare following surgery on the skin and subcutaneous tissue: Secondary | ICD-10-CM | POA: Diagnosis not present

## 2018-01-14 DIAGNOSIS — Z4881 Encounter for surgical aftercare following surgery on specified body systems: Secondary | ICD-10-CM | POA: Diagnosis not present

## 2018-01-14 DIAGNOSIS — L97822 Non-pressure chronic ulcer of other part of left lower leg with fat layer exposed: Secondary | ICD-10-CM | POA: Diagnosis not present

## 2018-01-14 DIAGNOSIS — I87332 Chronic venous hypertension (idiopathic) with ulcer and inflammation of left lower extremity: Secondary | ICD-10-CM | POA: Diagnosis not present

## 2018-01-14 DIAGNOSIS — L97213 Non-pressure chronic ulcer of right calf with necrosis of muscle: Secondary | ICD-10-CM | POA: Diagnosis not present

## 2018-01-14 DIAGNOSIS — I110 Hypertensive heart disease with heart failure: Secondary | ICD-10-CM | POA: Diagnosis not present

## 2018-01-14 DIAGNOSIS — I2510 Atherosclerotic heart disease of native coronary artery without angina pectoris: Secondary | ICD-10-CM | POA: Diagnosis not present

## 2018-01-14 DIAGNOSIS — E1151 Type 2 diabetes mellitus with diabetic peripheral angiopathy without gangrene: Secondary | ICD-10-CM | POA: Diagnosis not present

## 2018-01-14 DIAGNOSIS — Z945 Skin transplant status: Secondary | ICD-10-CM | POA: Diagnosis not present

## 2018-01-14 NOTE — Telephone Encounter (Signed)
Levada Dy, nurse with [facility] called to see if it is okay to continue nursing services for patient, she wanted to make sure Dr. Sharol Given will sign orders. Please advise # [phone]

## 2018-01-14 NOTE — Telephone Encounter (Signed)
I called and gave verbal for home health nursing to Angie with [facility].

## 2018-01-15 ENCOUNTER — Ambulatory Visit: Payer: Medicare HMO | Admitting: Nurse Practitioner

## 2018-01-15 DIAGNOSIS — Z945 Skin transplant status: Secondary | ICD-10-CM | POA: Diagnosis not present

## 2018-01-15 DIAGNOSIS — L97213 Non-pressure chronic ulcer of right calf with necrosis of muscle: Secondary | ICD-10-CM | POA: Diagnosis not present

## 2018-01-15 DIAGNOSIS — E1151 Type 2 diabetes mellitus with diabetic peripheral angiopathy without gangrene: Secondary | ICD-10-CM | POA: Diagnosis not present

## 2018-01-15 DIAGNOSIS — Z48817 Encounter for surgical aftercare following surgery on the skin and subcutaneous tissue: Secondary | ICD-10-CM | POA: Diagnosis not present

## 2018-01-15 DIAGNOSIS — E11622 Type 2 diabetes mellitus with other skin ulcer: Secondary | ICD-10-CM | POA: Diagnosis not present

## 2018-01-15 DIAGNOSIS — L97822 Non-pressure chronic ulcer of other part of left lower leg with fat layer exposed: Secondary | ICD-10-CM | POA: Diagnosis not present

## 2018-01-15 DIAGNOSIS — I70261 Atherosclerosis of native arteries of extremities with gangrene, right leg: Secondary | ICD-10-CM | POA: Diagnosis not present

## 2018-01-15 DIAGNOSIS — I2510 Atherosclerotic heart disease of native coronary artery without angina pectoris: Secondary | ICD-10-CM | POA: Diagnosis not present

## 2018-01-15 DIAGNOSIS — I87332 Chronic venous hypertension (idiopathic) with ulcer and inflammation of left lower extremity: Secondary | ICD-10-CM | POA: Diagnosis not present

## 2018-01-16 DIAGNOSIS — Z945 Skin transplant status: Secondary | ICD-10-CM | POA: Diagnosis not present

## 2018-01-16 DIAGNOSIS — L97822 Non-pressure chronic ulcer of other part of left lower leg with fat layer exposed: Secondary | ICD-10-CM | POA: Diagnosis not present

## 2018-01-16 DIAGNOSIS — I2510 Atherosclerotic heart disease of native coronary artery without angina pectoris: Secondary | ICD-10-CM | POA: Diagnosis not present

## 2018-01-16 DIAGNOSIS — L97213 Non-pressure chronic ulcer of right calf with necrosis of muscle: Secondary | ICD-10-CM | POA: Diagnosis not present

## 2018-01-16 DIAGNOSIS — E11622 Type 2 diabetes mellitus with other skin ulcer: Secondary | ICD-10-CM | POA: Diagnosis not present

## 2018-01-16 DIAGNOSIS — I70261 Atherosclerosis of native arteries of extremities with gangrene, right leg: Secondary | ICD-10-CM | POA: Diagnosis not present

## 2018-01-16 DIAGNOSIS — Z48817 Encounter for surgical aftercare following surgery on the skin and subcutaneous tissue: Secondary | ICD-10-CM | POA: Diagnosis not present

## 2018-01-16 DIAGNOSIS — I87332 Chronic venous hypertension (idiopathic) with ulcer and inflammation of left lower extremity: Secondary | ICD-10-CM | POA: Diagnosis not present

## 2018-01-16 DIAGNOSIS — E1151 Type 2 diabetes mellitus with diabetic peripheral angiopathy without gangrene: Secondary | ICD-10-CM | POA: Diagnosis not present

## 2018-01-17 ENCOUNTER — Ambulatory Visit (INDEPENDENT_AMBULATORY_CARE_PROVIDER_SITE_OTHER): Payer: Medicare HMO | Admitting: Orthopedic Surgery

## 2018-01-18 DIAGNOSIS — I87332 Chronic venous hypertension (idiopathic) with ulcer and inflammation of left lower extremity: Secondary | ICD-10-CM | POA: Diagnosis not present

## 2018-01-18 DIAGNOSIS — L97822 Non-pressure chronic ulcer of other part of left lower leg with fat layer exposed: Secondary | ICD-10-CM | POA: Diagnosis not present

## 2018-01-18 DIAGNOSIS — Z945 Skin transplant status: Secondary | ICD-10-CM | POA: Diagnosis not present

## 2018-01-18 DIAGNOSIS — I2510 Atherosclerotic heart disease of native coronary artery without angina pectoris: Secondary | ICD-10-CM | POA: Diagnosis not present

## 2018-01-18 DIAGNOSIS — Z48817 Encounter for surgical aftercare following surgery on the skin and subcutaneous tissue: Secondary | ICD-10-CM | POA: Diagnosis not present

## 2018-01-18 DIAGNOSIS — I70261 Atherosclerosis of native arteries of extremities with gangrene, right leg: Secondary | ICD-10-CM | POA: Diagnosis not present

## 2018-01-18 DIAGNOSIS — L97213 Non-pressure chronic ulcer of right calf with necrosis of muscle: Secondary | ICD-10-CM | POA: Diagnosis not present

## 2018-01-18 DIAGNOSIS — E11622 Type 2 diabetes mellitus with other skin ulcer: Secondary | ICD-10-CM | POA: Diagnosis not present

## 2018-01-18 DIAGNOSIS — E1151 Type 2 diabetes mellitus with diabetic peripheral angiopathy without gangrene: Secondary | ICD-10-CM | POA: Diagnosis not present

## 2018-01-21 DIAGNOSIS — L97822 Non-pressure chronic ulcer of other part of left lower leg with fat layer exposed: Secondary | ICD-10-CM | POA: Diagnosis not present

## 2018-01-21 DIAGNOSIS — L97213 Non-pressure chronic ulcer of right calf with necrosis of muscle: Secondary | ICD-10-CM | POA: Diagnosis not present

## 2018-01-21 DIAGNOSIS — I2510 Atherosclerotic heart disease of native coronary artery without angina pectoris: Secondary | ICD-10-CM | POA: Diagnosis not present

## 2018-01-21 DIAGNOSIS — I87332 Chronic venous hypertension (idiopathic) with ulcer and inflammation of left lower extremity: Secondary | ICD-10-CM | POA: Diagnosis not present

## 2018-01-21 DIAGNOSIS — Z945 Skin transplant status: Secondary | ICD-10-CM | POA: Diagnosis not present

## 2018-01-21 DIAGNOSIS — E1151 Type 2 diabetes mellitus with diabetic peripheral angiopathy without gangrene: Secondary | ICD-10-CM | POA: Diagnosis not present

## 2018-01-21 DIAGNOSIS — I70261 Atherosclerosis of native arteries of extremities with gangrene, right leg: Secondary | ICD-10-CM | POA: Diagnosis not present

## 2018-01-21 DIAGNOSIS — E11622 Type 2 diabetes mellitus with other skin ulcer: Secondary | ICD-10-CM | POA: Diagnosis not present

## 2018-01-21 DIAGNOSIS — Z48817 Encounter for surgical aftercare following surgery on the skin and subcutaneous tissue: Secondary | ICD-10-CM | POA: Diagnosis not present

## 2018-01-23 DIAGNOSIS — E1151 Type 2 diabetes mellitus with diabetic peripheral angiopathy without gangrene: Secondary | ICD-10-CM | POA: Diagnosis not present

## 2018-01-23 DIAGNOSIS — I2510 Atherosclerotic heart disease of native coronary artery without angina pectoris: Secondary | ICD-10-CM | POA: Diagnosis not present

## 2018-01-23 DIAGNOSIS — L97213 Non-pressure chronic ulcer of right calf with necrosis of muscle: Secondary | ICD-10-CM | POA: Diagnosis not present

## 2018-01-23 DIAGNOSIS — I70261 Atherosclerosis of native arteries of extremities with gangrene, right leg: Secondary | ICD-10-CM | POA: Diagnosis not present

## 2018-01-23 DIAGNOSIS — Z48817 Encounter for surgical aftercare following surgery on the skin and subcutaneous tissue: Secondary | ICD-10-CM | POA: Diagnosis not present

## 2018-01-23 DIAGNOSIS — L97822 Non-pressure chronic ulcer of other part of left lower leg with fat layer exposed: Secondary | ICD-10-CM | POA: Diagnosis not present

## 2018-01-23 DIAGNOSIS — Z945 Skin transplant status: Secondary | ICD-10-CM | POA: Diagnosis not present

## 2018-01-23 DIAGNOSIS — E11622 Type 2 diabetes mellitus with other skin ulcer: Secondary | ICD-10-CM | POA: Diagnosis not present

## 2018-01-23 DIAGNOSIS — I87332 Chronic venous hypertension (idiopathic) with ulcer and inflammation of left lower extremity: Secondary | ICD-10-CM | POA: Diagnosis not present

## 2018-01-24 ENCOUNTER — Encounter (INDEPENDENT_AMBULATORY_CARE_PROVIDER_SITE_OTHER): Payer: Self-pay | Admitting: Family

## 2018-01-24 ENCOUNTER — Ambulatory Visit (INDEPENDENT_AMBULATORY_CARE_PROVIDER_SITE_OTHER): Payer: Medicare HMO | Admitting: Family

## 2018-01-24 VITALS — Ht 65.0 in | Wt 181.0 lb

## 2018-01-24 DIAGNOSIS — L97215 Non-pressure chronic ulcer of right calf with muscle involvement without evidence of necrosis: Secondary | ICD-10-CM

## 2018-01-24 DIAGNOSIS — R609 Edema, unspecified: Secondary | ICD-10-CM

## 2018-01-24 DIAGNOSIS — Z945 Skin transplant status: Secondary | ICD-10-CM

## 2018-01-24 DIAGNOSIS — I83012 Varicose veins of right lower extremity with ulcer of calf: Secondary | ICD-10-CM

## 2018-01-24 NOTE — Progress Notes (Signed)
Office Visit Note   Patient: Emily Wall           Date of Birth: 01/28/1936           MRN: [id number] Visit Date: 01/24/2018              Requested by: Chevis Pretty, [address] [place], [place] [id number] PCP: Chevis Pretty, FNP  Chief Complaint  Patient presents with  . Right Foot - Routine Post Op    11/09/17 right achilles debridement ST[facility]      HPI: Patient presents in follow-up status post split-thickness skin graft and wound VAC therapy for a massive chronic necrotic ulceration over the Achilles and posterior aspect the right calf.  Patient is also had traumatic venous ulcers in the left lower extremity.  Patient is undergoing twice a week compressive wrap dressing, Dynaflex, changes with home health nursing to bilateral lower extremities.  Daughter states this week unfortunately her wraps got saturated with Kerosene which they use to heat their home. She removed the wraps and applied vaseline gauze and kerlix.  Assessment & Plan: Visit Diagnoses:  1. S/P split thickness skin graft   2. Peripheral edema   3. Venous stasis ulcer of right calf with muscle involvement without evidence of necrosis with varicose veins ([place])     Plan: We will have the wraps applied to both LEs. [place] assisting with twice weekly wraps. Recommended the importance of walking to stimulate the calf muscle pump avoid standing avoid dependency of the leg.  Follow-Up Instructions: Return in about 3 weeks (around 02/14/2018).   Ortho Exam  Patient is alert, oriented, no adenopathy, well-dressed, normal affect, normal respiratory effort. Examination the left leg has venous stasis changes with 3 ulcerative areas medially, largest is 15 mm in diameter. Pitting edema to BLE. Granulation in wound bed. there is no drainage no cellulitis.  Examination the right leg she has a thin layer of fibrinous exudative tissue over 40% of wound with improved granulation. Ulcer is 8 x 16 cm in  size. skin island centrally which is 25 mm in diameter.  There is no cellulitis no odor no signs of infection.  There is no drainage.   Imaging: No results found. No images are attached to the encounter.  Labs: Lab Results  Component Value Date   HGBA1C 8.1 (H) 11/03/2017   HGBA1C 6.7 02/15/2016   HGBA1C 7.2 11/15/2015   ESRSEDRATE 26 (H) 11/03/2017   CRP 1.8 (H) 11/03/2017   REPTSTATUS 11/08/2017 FINAL 11/03/2017   CULT NO GROWTH 5 DAYS 11/03/2017    @LABSALLVALUES (HGBA1)@  Body mass index is 30.12 kg/m.  Orders:  No orders of the defined types were placed in this encounter.  No orders of the defined types were placed in this encounter.    Procedures: No procedures performed  Clinical Data: No additional findings.  ROS:  All other systems negative, except as noted in the HPI. Review of Systems  Constitutional: Negative for chills and fever.  Cardiovascular: Positive for leg swelling.  Skin: Positive for wound. Negative for color change and rash.    Objective: Vital Signs: Ht 5\' 5"  (1.651 m)   Wt 181 lb (82.1 kg)   BMI 30.12 kg/m   Specialty Comments:  No specialty comments available.  PMFS History: Patient Active Problem List   Diagnosis Date Noted  . S/P split thickness skin graft 11/29/2017  . Diabetic leg ulcer ([place])   . Atherosclerosis of native arteries of extremities with gangrene, right  leg ([place])   . Wound infection 11/03/2017  . Hypoalbuminemia 11/03/2017  . Venous stasis ulcer ([place]) 11/03/2017  . BMI 26.0-26.9,adult 11/15/2015  . Family history of malignant neoplasm of gastrointestinal tract 06/30/2013  . Hyperlipidemia 06/02/2013  . Diabetes mellitus ([place]) 03/29/2011  . Osteoporosis 03/29/2011  . Iron deficiency anemia 12/29/2010  . Essential hypertension 09/08/2009  . Aortic valve disorder 09/08/2009  . Secondary cardiomyopathy ([place]) 09/08/2009   Past Medical History:  Diagnosis Date  . Aortic regurgitation    Mild  . Aortic  stenosis    Moderate  . Coronary atherosclerosis of native coronary artery    Nonobstructive 2007  . Essential hypertension, benign   . Hyperlipidemia   . Hypothyroidism   . Nonischemic cardiomyopathy (HCC)    LVEF improved to 50-55%  . Osteoporosis   . Type 2 diabetes mellitus (HCC)     Family History  Problem Relation Age of Onset  . Colon cancer Mother   . Colon cancer Brother   . Diabetes Brother   . Prostate cancer Brother     Past Surgical History:  Procedure Laterality Date  . LOWER EXTREMITY ANGIOGRAPHY N/A 11/13/2017   Procedure: LOWER EXTREMITY ANGIOGRAPHY;  Surgeon: Serafina Mitchell, MD;  Location: [facility];  Service: Cardiovascular;  Laterality: N/A;  . SKIN GRAFT    . SKIN SPLIT GRAFT Right 11/09/2017   Procedure: RIGHT LEG DEBRIDE ACHILLES, SPLIT THICKNESS SKIN GRAFT, VAC;  Surgeon: Newt Minion, MD;  Location: [facility];  Service: Orthopedics;  Laterality: Right;   Social History   Occupational History    Employer: RETIRED  Tobacco Use  . Smoking status: Former Smoker    Packs/day: 0.50    Years: 30.00    Pack years: 15.00    Types: Cigarettes    Last attempt to quit: 12/25/1988    Years since quitting: 29.1  . Smokeless tobacco: Never Used  Substance and Sexual Activity  . Alcohol use: No    Alcohol/week: 0.0 oz  . Drug use: No  . Sexual activity: No

## 2018-01-25 DIAGNOSIS — L97822 Non-pressure chronic ulcer of other part of left lower leg with fat layer exposed: Secondary | ICD-10-CM | POA: Diagnosis not present

## 2018-01-25 DIAGNOSIS — E1151 Type 2 diabetes mellitus with diabetic peripheral angiopathy without gangrene: Secondary | ICD-10-CM | POA: Diagnosis not present

## 2018-01-25 DIAGNOSIS — Z945 Skin transplant status: Secondary | ICD-10-CM | POA: Diagnosis not present

## 2018-01-25 DIAGNOSIS — I87332 Chronic venous hypertension (idiopathic) with ulcer and inflammation of left lower extremity: Secondary | ICD-10-CM | POA: Diagnosis not present

## 2018-01-25 DIAGNOSIS — Z48817 Encounter for surgical aftercare following surgery on the skin and subcutaneous tissue: Secondary | ICD-10-CM | POA: Diagnosis not present

## 2018-01-25 DIAGNOSIS — L97213 Non-pressure chronic ulcer of right calf with necrosis of muscle: Secondary | ICD-10-CM | POA: Diagnosis not present

## 2018-01-25 DIAGNOSIS — I2510 Atherosclerotic heart disease of native coronary artery without angina pectoris: Secondary | ICD-10-CM | POA: Diagnosis not present

## 2018-01-25 DIAGNOSIS — E11622 Type 2 diabetes mellitus with other skin ulcer: Secondary | ICD-10-CM | POA: Diagnosis not present

## 2018-01-25 DIAGNOSIS — I70261 Atherosclerosis of native arteries of extremities with gangrene, right leg: Secondary | ICD-10-CM | POA: Diagnosis not present

## 2018-01-28 DIAGNOSIS — I87332 Chronic venous hypertension (idiopathic) with ulcer and inflammation of left lower extremity: Secondary | ICD-10-CM | POA: Diagnosis not present

## 2018-01-28 DIAGNOSIS — I70261 Atherosclerosis of native arteries of extremities with gangrene, right leg: Secondary | ICD-10-CM | POA: Diagnosis not present

## 2018-01-28 DIAGNOSIS — L97213 Non-pressure chronic ulcer of right calf with necrosis of muscle: Secondary | ICD-10-CM | POA: Diagnosis not present

## 2018-01-28 DIAGNOSIS — I2510 Atherosclerotic heart disease of native coronary artery without angina pectoris: Secondary | ICD-10-CM | POA: Diagnosis not present

## 2018-01-28 DIAGNOSIS — E11622 Type 2 diabetes mellitus with other skin ulcer: Secondary | ICD-10-CM | POA: Diagnosis not present

## 2018-01-28 DIAGNOSIS — E1151 Type 2 diabetes mellitus with diabetic peripheral angiopathy without gangrene: Secondary | ICD-10-CM | POA: Diagnosis not present

## 2018-01-28 DIAGNOSIS — Z48817 Encounter for surgical aftercare following surgery on the skin and subcutaneous tissue: Secondary | ICD-10-CM | POA: Diagnosis not present

## 2018-01-28 DIAGNOSIS — Z945 Skin transplant status: Secondary | ICD-10-CM | POA: Diagnosis not present

## 2018-01-28 DIAGNOSIS — L97822 Non-pressure chronic ulcer of other part of left lower leg with fat layer exposed: Secondary | ICD-10-CM | POA: Diagnosis not present

## 2018-01-30 DIAGNOSIS — I2510 Atherosclerotic heart disease of native coronary artery without angina pectoris: Secondary | ICD-10-CM | POA: Diagnosis not present

## 2018-01-30 DIAGNOSIS — L97822 Non-pressure chronic ulcer of other part of left lower leg with fat layer exposed: Secondary | ICD-10-CM | POA: Diagnosis not present

## 2018-01-30 DIAGNOSIS — I70261 Atherosclerosis of native arteries of extremities with gangrene, right leg: Secondary | ICD-10-CM | POA: Diagnosis not present

## 2018-01-30 DIAGNOSIS — Z945 Skin transplant status: Secondary | ICD-10-CM | POA: Diagnosis not present

## 2018-01-30 DIAGNOSIS — E11622 Type 2 diabetes mellitus with other skin ulcer: Secondary | ICD-10-CM | POA: Diagnosis not present

## 2018-01-30 DIAGNOSIS — I87332 Chronic venous hypertension (idiopathic) with ulcer and inflammation of left lower extremity: Secondary | ICD-10-CM | POA: Diagnosis not present

## 2018-01-30 DIAGNOSIS — E1151 Type 2 diabetes mellitus with diabetic peripheral angiopathy without gangrene: Secondary | ICD-10-CM | POA: Diagnosis not present

## 2018-01-30 DIAGNOSIS — Z48817 Encounter for surgical aftercare following surgery on the skin and subcutaneous tissue: Secondary | ICD-10-CM | POA: Diagnosis not present

## 2018-01-30 DIAGNOSIS — L97213 Non-pressure chronic ulcer of right calf with necrosis of muscle: Secondary | ICD-10-CM | POA: Diagnosis not present

## 2018-02-01 ENCOUNTER — Other Ambulatory Visit: Payer: Self-pay

## 2018-02-01 ENCOUNTER — Ambulatory Visit: Payer: Medicare HMO | Admitting: Physician Assistant

## 2018-02-01 ENCOUNTER — Emergency Department (HOSPITAL_COMMUNITY)
Admission: EM | Admit: 2018-02-01 | Discharge: 2018-02-01 | Disposition: A | Discharge status: Home / Self Care | Payer: Medicare HMO | Attending: Emergency Medicine | Admitting: Emergency Medicine

## 2018-02-01 ENCOUNTER — Encounter (HOSPITAL_COMMUNITY): Payer: Self-pay

## 2018-02-01 ENCOUNTER — Emergency Department (HOSPITAL_COMMUNITY): Payer: Medicare HMO

## 2018-02-01 DIAGNOSIS — I70261 Atherosclerosis of native arteries of extremities with gangrene, right leg: Secondary | ICD-10-CM | POA: Diagnosis not present

## 2018-02-01 DIAGNOSIS — R069 Unspecified abnormalities of breathing: Secondary | ICD-10-CM | POA: Diagnosis not present

## 2018-02-01 DIAGNOSIS — E1151 Type 2 diabetes mellitus with diabetic peripheral angiopathy without gangrene: Secondary | ICD-10-CM | POA: Diagnosis not present

## 2018-02-01 DIAGNOSIS — E039 Hypothyroidism, unspecified: Secondary | ICD-10-CM | POA: Insufficient documentation

## 2018-02-01 DIAGNOSIS — I5043 Acute on chronic combined systolic (congestive) and diastolic (congestive) heart failure: Secondary | ICD-10-CM | POA: Diagnosis not present

## 2018-02-01 DIAGNOSIS — R0602 Shortness of breath: Secondary | ICD-10-CM | POA: Diagnosis not present

## 2018-02-01 DIAGNOSIS — E119 Type 2 diabetes mellitus without complications: Secondary | ICD-10-CM | POA: Diagnosis not present

## 2018-02-01 DIAGNOSIS — E11622 Type 2 diabetes mellitus with other skin ulcer: Secondary | ICD-10-CM | POA: Diagnosis not present

## 2018-02-01 DIAGNOSIS — I2510 Atherosclerotic heart disease of native coronary artery without angina pectoris: Secondary | ICD-10-CM | POA: Diagnosis not present

## 2018-02-01 DIAGNOSIS — R609 Edema, unspecified: Secondary | ICD-10-CM

## 2018-02-01 DIAGNOSIS — I110 Hypertensive heart disease with heart failure: Secondary | ICD-10-CM | POA: Diagnosis not present

## 2018-02-01 DIAGNOSIS — Z87891 Personal history of nicotine dependence: Secondary | ICD-10-CM | POA: Insufficient documentation

## 2018-02-01 DIAGNOSIS — I87332 Chronic venous hypertension (idiopathic) with ulcer and inflammation of left lower extremity: Secondary | ICD-10-CM | POA: Diagnosis not present

## 2018-02-01 DIAGNOSIS — Z79899 Other long term (current) drug therapy: Secondary | ICD-10-CM | POA: Diagnosis not present

## 2018-02-01 DIAGNOSIS — Z7982 Long term (current) use of aspirin: Secondary | ICD-10-CM | POA: Diagnosis not present

## 2018-02-01 DIAGNOSIS — L97822 Non-pressure chronic ulcer of other part of left lower leg with fat layer exposed: Secondary | ICD-10-CM | POA: Diagnosis not present

## 2018-02-01 DIAGNOSIS — Z7984 Long term (current) use of oral hypoglycemic drugs: Secondary | ICD-10-CM | POA: Insufficient documentation

## 2018-02-01 DIAGNOSIS — Z945 Skin transplant status: Secondary | ICD-10-CM | POA: Diagnosis not present

## 2018-02-01 DIAGNOSIS — L97213 Non-pressure chronic ulcer of right calf with necrosis of muscle: Secondary | ICD-10-CM | POA: Diagnosis not present

## 2018-02-01 DIAGNOSIS — Z48817 Encounter for surgical aftercare following surgery on the skin and subcutaneous tissue: Secondary | ICD-10-CM | POA: Diagnosis not present

## 2018-02-01 LAB — COMPREHENSIVE METABOLIC PANEL
ALK PHOS: 69 U/L (ref 38–126)
ALT: 13 U/L — AB (ref 14–54)
AST: 22 U/L (ref 15–41)
Albumin: 2.9 g/dL — ABNORMAL LOW (ref 3.5–5.0)
Anion gap: 8 (ref 5–15)
BILIRUBIN TOTAL: 0.5 mg/dL (ref 0.3–1.2)
BUN: 10 mg/dL (ref 6–20)
CALCIUM: 8.4 mg/dL — AB (ref 8.9–10.3)
CO2: 29 mmol/L (ref 22–32)
CREATININE: 0.65 mg/dL (ref 0.44–1.00)
Chloride: 97 mmol/L — ABNORMAL LOW (ref 101–111)
GFR calc Af Amer: >60 mL/min (ref >60)
Glucose, Bld: 156 mg/dL — ABNORMAL HIGH (ref 65–99)
Potassium: 3.8 mmol/L (ref 3.5–5.1)
Sodium: 134 mmol/L — ABNORMAL LOW (ref 135–145)
TOTAL PROTEIN: 6.8 g/dL (ref 6.5–8.1)

## 2018-02-01 LAB — CBC WITH DIFFERENTIAL/PLATELET
BASOS ABS: 0 10*3/uL (ref 0.0–0.1)
Basophils Relative: 0 %
EOS ABS: 0.1 10*3/uL (ref 0.0–0.7)
EOS PCT: 2 %
HCT: 31 % — ABNORMAL LOW (ref 36.0–46.0)
Hemoglobin: 9.9 g/dL — ABNORMAL LOW (ref 12.0–15.0)
LYMPHS ABS: 0.9 10*3/uL (ref 0.7–4.0)
Lymphocytes Relative: 25 %
MCH: 30.5 pg (ref 26.0–34.0)
MCHC: 31.9 g/dL (ref 30.0–36.0)
MCV: 95.4 fL (ref 78.0–100.0)
Monocytes Absolute: 0.3 10*3/uL (ref 0.1–1.0)
Monocytes Relative: 7 %
Neutro Abs: 2.4 10*3/uL (ref 1.7–7.7)
Neutrophils Relative %: 66 %
PLATELETS: 213 10*3/uL (ref 150–400)
RBC: 3.25 MIL/uL — ABNORMAL LOW (ref 3.87–5.11)
RDW: 17 % — ABNORMAL HIGH (ref 11.5–15.5)
WBC: 3.7 10*3/uL — AB (ref 4.0–10.5)

## 2018-02-01 LAB — I-STAT TROPONIN, ED
Troponin i, poc: 0 ng/mL (ref 0.00–0.08)
Troponin i, poc: 0.02 ng/mL (ref 0.00–0.08)

## 2018-02-01 LAB — TROPONIN I

## 2018-02-01 LAB — BRAIN NATRIURETIC PEPTIDE: B Natriuretic Peptide: 2338 pg/mL — ABNORMAL HIGH (ref 0.0–100.0)

## 2018-02-01 MED ORDER — FUROSEMIDE 10 MG/ML IJ SOLN
60.0000 mg | Freq: Once | INTRAMUSCULAR | Status: AC
  Administered 2018-02-01: 60 mg via INTRAVENOUS
  Filled 2018-02-01: qty 6

## 2018-02-01 MED ORDER — FUROSEMIDE 40 MG PO TABS: 60.0000 mg | ORAL_TABLET | Freq: Every day | ORAL | 3 refills | Status: DC

## 2018-02-01 NOTE — Discharge Instructions (Signed)
As discussed, today's evaluation demonstrated evidence for worsening congestive heart failure. It is important to take your new Lasix dose, 60 mg, daily, until you see your physicians next week. Return here for concerning changes in your condition, otherwise, please be sure to keep your upcoming appointments.

## 2018-02-01 NOTE — ED Provider Notes (Signed)
[facility] EMERGENCY DEPARTMENT Provider Note   CSN: [id number] Arrival date & time: 02/01/18  2002     History   Chief Complaint Chief Complaint  Patient presents with  . Shortness of Breath    HPI Emily Wall is a 82 y.o. female.  HPI Elderly female presents with concern of dyspnea, particularly with exertion. It is unclear when symptoms became worse, but she seems to indicate that over the past days, possibly week she has had more dyspnea than usual, including both dyspnea and fatigue with exertion. No pain, no fever, no vomiting, no diarrhea. Patient notes that at the end of last year she had her Lasix dose cut in half. Over this past week, beyond exertion, no clear alleviating or exacerbating factors. She notes increased lower extremity swelling, symmetrically, nontender  Past Medical History:  Diagnosis Date  . Aortic regurgitation    Mild  . Aortic stenosis    Moderate  . Coronary atherosclerosis of native coronary artery    Nonobstructive 2007  . Essential hypertension, benign   . Hyperlipidemia   . Hypothyroidism   . Nonischemic cardiomyopathy (HCC)    LVEF improved to 50-55%  . Osteoporosis   . Type 2 diabetes mellitus [facility])     Patient Active Problem List   Diagnosis Date Noted  . S/P split thickness skin graft 11/29/2017  . Diabetic leg ulcer ([place])   . Atherosclerosis of native arteries of extremities with gangrene, right leg ([place])   . Wound infection 11/03/2017  . Hypoalbuminemia 11/03/2017  . Venous stasis ulcer ([place]) 11/03/2017  . BMI 26.0-26.9,adult 11/15/2015  . Family history of malignant neoplasm of gastrointestinal tract 06/30/2013  . Hyperlipidemia 06/02/2013  . Diabetes mellitus ([place]) 03/29/2011  . Osteoporosis 03/29/2011  . Iron deficiency anemia 12/29/2010  . Essential hypertension 09/08/2009  . Aortic valve disorder 09/08/2009  . Secondary cardiomyopathy ([place]) 09/08/2009    Past Surgical History:  Procedure Laterality Date    . LOWER EXTREMITY ANGIOGRAPHY N/A 11/13/2017   Procedure: LOWER EXTREMITY ANGIOGRAPHY;  Surgeon: Serafina Mitchell, MD;  Location: [facility];  Service: Cardiovascular;  Laterality: N/A;  . SKIN GRAFT    . SKIN SPLIT GRAFT Right 11/09/2017   Procedure: RIGHT LEG DEBRIDE ACHILLES, SPLIT THICKNESS SKIN GRAFT, VAC;  Surgeon: Newt Minion, MD;  Location: [facility];  Service: Orthopedics;  Laterality: Right;    OB History    No data available       Home Medications    Prior to Admission medications   Medication Sig Start Date End Date Taking? Authorizing Provider  alendronate (FOSAMAX) 70 MG tablet TAKE 1 TABLET BY MOUTH ONCE A WEEK WITH A FULL GLASS OF WATER ON AN EMPTY STOMACH 02/19/17  Yes Hassell Done, Mary-Margaret, FNP  aspirin 325 MG tablet Take 325 mg by mouth daily.     Yes [provider]  atorvastatin (LIPITOR) 20 MG tablet TAKE 1 TABLET BY MOUTH EVERY DAY 12/19/17  Yes Hassell Done, Mary-Margaret, FNP  Calcium Carbonate-Vitamin D (CALCIUM + D PO) Take 1 tablet by mouth daily.     Yes [provider]  carvedilol (COREG) 25 MG tablet TAKE 1 TABLET (25 MG TOTAL) BY MOUTH 2 (TWO) TIMES DAILY WITH A MEAL. 01/08/18  Yes Martin, Mary-Margaret, FNP  cholecalciferol (VITAMIN D) 1000 UNITS tablet Take 1,000 Units by mouth daily.    Yes [provider]  ferrous sulfate 325 (65 FE) MG tablet TAKE 1 TABLET BY MOUTH EVERY DAY WITH BREAKFAST 12/10/17  Yes  Hassell Done, Mary-Margaret, FNP  furosemide (LASIX) 40 MG tablet Take 1 tablet (40 mg total) by mouth daily. 01/03/18  Yes Martin, Mary-Margaret, FNP  lisinopril (PRINIVIL,ZESTRIL) 20 MG tablet Take 1 tablet (20 mg total) by mouth daily. 11/14/17  Yes Dessa Phi, DO  metFORMIN (GLUCOPHAGE) 1000 MG tablet TAKE 1 TABLET (1,000 MG TOTAL) BY MOUTH 2 (TWO) TIMES DAILY WITH A MEAL. 12/26/17  Yes Hassell Done, Mary-Margaret, FNP  oxyCODONE-acetaminophen (PERCOCET/ROXICET) 5-325 MG tablet Take 1 tablet by mouth 2 (two) times daily as needed for  severe pain. 01/10/18  Yes Dondra Prader R, NP  polyethylene glycol (MIRALAX / GLYCOLAX) packet Take 17 g by mouth daily as needed for mild constipation. 11/14/17  Yes Dessa Phi, DO  silver sulfADIAZINE (SILVADENE) 1 % cream Apply 1 application topically daily. 11/22/17  Yes Dondra Prader R, NP  triamcinolone cream (KENALOG) 0.1 % APPLY TO AFFECTED AREA TWICE A DAY Patient taking differently: APPLY TO AFFECTED AREA TWICE A DAY AS NEEDED FOR RASH 07/30/17  Yes Martin, Mary-Margaret, FNP  sitaGLIPtin (JANUVIA) 100 MG tablet Take 100 mg daily by mouth.    [provider]    Family History Family History  Problem Relation Age of Onset  . Colon cancer Mother   . Colon cancer Brother   . Diabetes Brother   . Prostate cancer Brother     Social History Social History   Tobacco Use  . Smoking status: Former Smoker    Packs/day: 0.50    Years: 30.00    Pack years: 15.00    Types: Cigarettes    Last attempt to quit: 12/25/1988    Years since quitting: 29.1  . Smokeless tobacco: Never Used  Substance Use Topics  . Alcohol use: No    Alcohol/week: 0.0 oz  . Drug use: No     Allergies   Patient has no known allergies.   Review of Systems Review of Systems  Constitutional:       Per HPI, otherwise negative  HENT:       Per HPI, otherwise negative  Respiratory:       Per HPI, otherwise negative  Cardiovascular:       Per HPI, otherwise negative  Gastrointestinal: Negative for vomiting.  Endocrine:       Negative aside from HPI  Genitourinary:       Neg aside from HPI   Musculoskeletal:       Per HPI, otherwise negative  Skin: Negative.   Neurological: Positive for weakness. Negative for syncope.     Physical Exam Updated Vital Signs Ht 5\' 5"  (1.651 m)   Wt 82.1 kg (181 lb)   BMI 30.12 kg/m   Physical Exam  Constitutional: She is oriented to person, place, and time. She appears well-developed and well-nourished. No distress.  HENT:  Head: Normocephalic  and atraumatic.  Eyes: Conjunctivae and EOM are normal.  Cardiovascular: Normal rate and regular rhythm.  Pulmonary/Chest: Effort normal. She has decreased breath sounds.  Abdominal: She exhibits no distension.  Musculoskeletal:       Right lower leg: She exhibits edema.       Left lower leg: She exhibits edema.  Neurological: She is alert and oriented to person, place, and time. No cranial nerve deficit.  Skin: Skin is warm and dry.  Psychiatric: She has a normal mood and affect.  Nursing note and vitals reviewed.    ED Treatments / Results  Labs (all labs ordered are listed, but only abnormal results are displayed) Labs  Reviewed  COMPREHENSIVE METABOLIC PANEL - Abnormal; Notable for the following components:      Result Value   Sodium 134 (*)    Chloride 97 (*)    Glucose, Bld 156 (*)    Calcium 8.4 (*)    Albumin 2.9 (*)    ALT 13 (*)    All other components within normal limits  CBC WITH DIFFERENTIAL/PLATELET - Abnormal; Notable for the following components:   WBC 3.7 (*)    RBC 3.25 (*)    Hemoglobin 9.9 (*)    HCT 31.0 (*)    RDW 17.0 (*)    All other components within normal limits  BRAIN NATRIURETIC PEPTIDE - Abnormal; Notable for the following components:   B Natriuretic Peptide 2,338.0 (*)    All other components within normal limits  TROPONIN I  I-STAT TROPONIN, ED  I-STAT TROPONIN, ED    EKG  EKG Interpretation  Date/Time:  Friday February 01 2018 20:10:10 EST Ventricular Rate:  82 PR Interval:    QRS Duration: 102 QT Interval:  404 QTC Calculation: 472 R Axis:   -46 Text Interpretation:  Sinus rhythm LAD, consider left anterior fascicular block Left ventricular hypertrophy Anterior Q waves, possibly due to LVH Artifact abn\ Confirmed by Carmin Muskrat [phone]) on 02/01/2018 8:37:27 PM       Radiology Dg Chest 2 View  Result Date: 02/01/2018 CLINICAL DATA:  Shortness of breath. EXAM: CHEST  2 VIEW COMPARISON:  01/03/2018 FINDINGS: Chronic  cardiomegaly. Chronic aortic atherosclerosis. Chronic pulmonary scarring. Superimposed increased interstitial markings consistent with mild interstitial edema. Small effusions in the posterior costophrenic angles. Chronic pleural blunting. IMPRESSION: Mild interstitial edema. Small effusions. Chronic cardiomegaly and aortic atherosclerosis. Chronic lung scarring. Electronically Signed   By: Nelson Chimes M.D.   On: 02/01/2018 20:51    Procedures Procedures (including critical care time)  Medications Ordered in ED Medications  furosemide (LASIX) injection 60 mg (not administered)     Initial Impression / Assessment and Plan / ED Course  I have reviewed the triage vital signs and the nursing notes.  Pertinent labs & imaging results that were available during my care of the patient were reviewed by me and considered in my medical decision making (see chart for details).    On repeat exam patient is in similar condition, with no hypoxia at rest.   Daughter now present as well, she notes that over the past months since surgery patient has had several visits with changes in her Lasix dosing.   The patient's evaluation was notable for elevated BNP, consistent with heart failure exacerbation. Absent hypoxia, evidence for pneumonia, ACS, patient received IV Lasix. As the patient had recent decrease in her Lasix dosing, we counseled her on the need to increase her dosing again, and follow-up with primary care. With the after mentioned reassuring findings, no evidence for ACS, pneumonia, or other acute new pathology, patient was discharged in stable condition.  Final Clinical Impressions(s) / ED Diagnoses  Heart failure exacerbation   Carmin Muskrat, MD 02/01/18 [id number]

## 2018-02-01 NOTE — ED Triage Notes (Signed)
Pt arrives via [facility] from home. She lives with her daughter who is the one who called [facility] to bring her in. Pt reports dyspnea upon exertion.

## 2018-02-04 ENCOUNTER — Ambulatory Visit (INDEPENDENT_AMBULATORY_CARE_PROVIDER_SITE_OTHER): Payer: Medicare HMO | Admitting: Nurse Practitioner

## 2018-02-04 ENCOUNTER — Encounter: Payer: Self-pay | Admitting: Nurse Practitioner

## 2018-02-04 VITALS — BP 127/70 | HR 85 | Temp 97.5°F | Ht 65.0 in | Wt 181.0 lb

## 2018-02-04 DIAGNOSIS — Z945 Skin transplant status: Secondary | ICD-10-CM | POA: Diagnosis not present

## 2018-02-04 DIAGNOSIS — R600 Localized edema: Secondary | ICD-10-CM

## 2018-02-04 DIAGNOSIS — I87332 Chronic venous hypertension (idiopathic) with ulcer and inflammation of left lower extremity: Secondary | ICD-10-CM | POA: Diagnosis not present

## 2018-02-04 DIAGNOSIS — I509 Heart failure, unspecified: Secondary | ICD-10-CM | POA: Diagnosis not present

## 2018-02-04 DIAGNOSIS — I70261 Atherosclerosis of native arteries of extremities with gangrene, right leg: Secondary | ICD-10-CM | POA: Diagnosis not present

## 2018-02-04 DIAGNOSIS — Z48817 Encounter for surgical aftercare following surgery on the skin and subcutaneous tissue: Secondary | ICD-10-CM | POA: Diagnosis not present

## 2018-02-04 DIAGNOSIS — L97213 Non-pressure chronic ulcer of right calf with necrosis of muscle: Secondary | ICD-10-CM | POA: Diagnosis not present

## 2018-02-04 DIAGNOSIS — R609 Edema, unspecified: Secondary | ICD-10-CM | POA: Diagnosis not present

## 2018-02-04 DIAGNOSIS — E1151 Type 2 diabetes mellitus with diabetic peripheral angiopathy without gangrene: Secondary | ICD-10-CM | POA: Diagnosis not present

## 2018-02-04 DIAGNOSIS — L97822 Non-pressure chronic ulcer of other part of left lower leg with fat layer exposed: Secondary | ICD-10-CM | POA: Diagnosis not present

## 2018-02-04 DIAGNOSIS — E11622 Type 2 diabetes mellitus with other skin ulcer: Secondary | ICD-10-CM | POA: Diagnosis not present

## 2018-02-04 DIAGNOSIS — I2510 Atherosclerotic heart disease of native coronary artery without angina pectoris: Secondary | ICD-10-CM | POA: Diagnosis not present

## 2018-02-04 MED ORDER — FUROSEMIDE 40 MG PO TABS: ORAL_TABLET | ORAL | 5 refills | Status: AC

## 2018-02-04 MED ORDER — FUROSEMIDE 40 MG PO TABS: ORAL_TABLET | ORAL | 5 refills | Status: DC

## 2018-02-04 MED ORDER — TRIAMCINOLONE ACETONIDE 0.1 % EX CREA: TOPICAL_CREAM | CUTANEOUS | 3 refills | Status: AC

## 2018-02-04 NOTE — Patient Instructions (Signed)
Living With Heart Failure  Heart failure is a long-term (chronic) condition in which the heart cannot pump enough blood through the body. When this happens, parts of the body do not get the blood and oxygen they need. There is no cure for heart failure at this time, so it is important for you to take good care of yourself and follow the treatment plan set by your health care provider. If you are living with heart failure, there are ways to help you manage the disease. Follow these instructions at home: Living with heart failure requires you to make changes in your life. Your health care team will teach you about the changes you need to make in order to relieve your symptoms and lower your risk of going to the hospital. Follow the treatment plan as set by your health care provider. Medicines Medicines are important in reducing your heart's workload, slowing the progression of heart failure, and improving your symptoms.  Take over-the-counter and prescription medicines only as told by your health care provider.  Do not stop taking your medicine unless your health care provider tells you to do that.  Do not skip any dose of your medicine.  Refill prescriptions before you run out of medicine. You need your medicines every day.  Eating and drinking  Eat heart-healthy foods. Talk with a dietitian to make an eating plan that is right for you. ? If directed by your health care provider: ? Limit salt (sodium). Lowering your sodium intake may reduce symptoms of heart failure. Ask a dietitian to recommend heart-healthy seasonings. ? Limit your fluid intake. Fluid restriction may reduce symptoms of heart failure. ? Use low-fat cooking methods instead of frying. Low-fat methods include roasting, grilling, broiling, baking, poaching, steaming, and stir-frying. ? Choose foods that contain no trans fat and are low in saturated fat and cholesterol. Healthy choices include fresh or frozen fruits and vegetables,  fish, lean meats, legumes, fat-free or low-fat dairy products, and whole-grain or high-fiber foods.  Limit alcohol intake to no more than 1 drink a day for nonpregnant women and 2 drinks a day for men. One drink equals 12 oz of beer, 5 oz of wine, or 1 oz of hard liquor. ? Drinking more than that is harmful to your heart. Tell your health care provider if you drink alcohol several times a week. ? Talk with your health care provider about whether any level of alcohol use is safe for you. Activity  Ask your health care provider about attending cardiac rehabilitation. These programs include aerobic physical activity, which provides many benefits for your heart.  If no cardiac rehabilitation program is available, ask your health care provider what aerobic exercises are safe for you to do. Lifestyle Make the lifestyle changes recommended by your health care provider. In general:  Lose weight if your health care provider tells you to do that. Weight loss may reduce symptoms of heart failure.  Do not use any products that contain nicotine or tobacco, such as cigarettes or e-cigarettes. If you need help quitting, ask your health care provider.  Do not use street (illegal) drugs.  Return to your normal activities as told by your health care provider. Ask your health care provider what activities are safe for you.  General instructions  Make sure you weigh yourself every day to track your weight. Rapid weight gain may indicate an increase in fluid in your body and may increase the workload of your heart. ? Weigh yourself every morning. Do   this after you urinate but before you eat breakfast. ? Wear the same type of clothing, without shoes, each time you weigh yourself. ? Weigh yourself on the same scale and in the same spot each time.  Living with chronic heart failure often leads to emotions such as fear, stress, anxiety, and depression. If you feel any of these emotions and need help coping,  contact your health care provider. Other ways to get help include: ? Talking to friends and family members about your condition. They can give you support and guidance. Explain your symptoms to them and, if comfortable, invite them to attend appointments or rehabilitation with you. ? Joining a support group for people with chronic heart failure. Talking with other people who have the same symptoms may give you new ways of coping with your disease and your emotions.  Stay up to date with your shots (vaccines). Staying current on pneumococcal and influenza vaccines is especially important in preventing germs from attacking your airways (respiratory infections).  Keep all follow-up visits as told by your health care provider. This is important. How to recognize changes in your condition You and your family members need to know what changes to watch for in your condition. Watch for the following changes and report them to your health care provider:  Sudden weight gain. Ask your health care provider what amount of weight gain to report.  Shortness of breath: ? Feeling short of breath while at rest, with no exercise or activity that required great effort. ? Feeling breathless with activity.  Swelling of your lower legs or ankles.  Difficulty sleeping: ? You wake up feeling short of breath. ? You have to use more pillows to raise your head in order to sleep.  Frequent, dry, hacking cough.  Loss of appetite.  Feeling more tired all the time.  Depression or feelings of sadness or hopelessness.  Bloating in the stomach.  Where to find more information  Local support groups. Ask your health care provider about groups near you.  The American Heart Association: www.heart.org Contact a health care provider if:  You have a rapid weight gain.  You have increasing shortness of breath that is unusual for you.  You are unable to participate in your usual physical activities.  You tire  easily.  You cough more than normal, especially with physical activity.  You have any swelling or more swelling in areas such as your hands, feet, ankles, or abdomen.  You feel like your heart is beating quickly (palpitations).  You become dizzy or light-headed when you stand up. Get help right away if:  You have difficulty breathing.  You notice or your family notices a change in your awareness, such as having trouble staying awake or having difficulty with concentration.  You have pain or discomfort in your chest.  You have an episode of fainting (syncope). Summary  There is no cure for heart failure, so it is important for you to take good care of yourself and follow the treatment plan set by your health care provider.  Medicines are important in reducing your heart's workload, slowing the progression of heart failure, and improving your symptoms.  Living with chronic heart failure often leads to emotions such as fear, stress, anxiety, and depression. If you are feeling any of these emotions and need help coping, contact your health care provider. This information is not intended to replace advice given to you by your health care provider. Make sure you discuss any questions you   have with your health care provider. Document Released: 04/25/2017 Document Revised: 04/25/2017 Document Reviewed: 04/25/2017 Elsevier Interactive Patient Education  2018 [employer]  

## 2018-02-04 NOTE — Progress Notes (Signed)
   Subjective:    Patient ID: Emily Wall, female    DOB: 1936/09/14, 82 y.o.   MRN: [id number]  HPI Patient is brought in today by her sister with fluid retention. She was taken to hospital on Friday with fluid retention and fatigue. They gave her iv fluids to remove fluid and then was discharged. She denies any SOB but dies have some swelling in lower ext. She is currently on 40mg  of lasix 1 daily.    Review of Systems  Constitutional: Positive for fatigue.  HENT: Positive for congestion.   Respiratory: Positive for cough (slight). Negative for shortness of breath.   Cardiovascular: Positive for leg swelling.  Gastrointestinal: Negative.   Genitourinary: Negative.   Neurological: Negative.   Psychiatric/Behavioral: Negative.   All other systems reviewed and are negative.      Objective:   Physical Exam  Constitutional: She appears well-developed and well-nourished. No distress.  Cardiovascular: Normal rate.  Murmur (3/6 systolic murmur) heard. Pulmonary/Chest: Effort normal. She has no wheezes. She has no rales.  Abdominal: Soft. Bowel sounds are normal.  Musculoskeletal: She exhibits edema (2+ edema right lower leg and 1+ edema left lower leg).  Skin: Skin is warm.  Psychiatric: She has a normal mood and affect. Her behavior is normal. Judgment and thought content normal.   BP 127/70   Pulse 85   Temp (!) 97.5 F (36.4 C) (Oral)   Ht 5\' 5"  (1.651 m)   Wt 181 lb (82.1 kg)   BMI 30.12 kg/m       Assessment & Plan:   1. Fluid retention   2. Chronic congestive heart failure, unspecified heart failure type ([place])   3. Peripheral edema    increased lasix to 1 in am and 1/3 tablet at 1pm Daily weights- is has a 2 lb increase in 24 hours  Let me know Elevate legs when sitting Hospital records reviewed follow up in 2 weeks  [place], FNP

## 2018-02-04 NOTE — Addendum Note (Signed)
Addended by: Chevis Pretty on: 02/04/2018 10:26 AM   Modules accepted: Orders

## 2018-02-05 ENCOUNTER — Other Ambulatory Visit: Payer: Self-pay | Admitting: Nurse Practitioner

## 2018-02-05 ENCOUNTER — Encounter: Payer: Self-pay | Admitting: Nurse Practitioner

## 2018-02-05 DIAGNOSIS — I10 Essential (primary) hypertension: Secondary | ICD-10-CM

## 2018-02-06 DIAGNOSIS — Z48817 Encounter for surgical aftercare following surgery on the skin and subcutaneous tissue: Secondary | ICD-10-CM | POA: Diagnosis not present

## 2018-02-06 DIAGNOSIS — I2510 Atherosclerotic heart disease of native coronary artery without angina pectoris: Secondary | ICD-10-CM | POA: Diagnosis not present

## 2018-02-06 DIAGNOSIS — L97213 Non-pressure chronic ulcer of right calf with necrosis of muscle: Secondary | ICD-10-CM | POA: Diagnosis not present

## 2018-02-06 DIAGNOSIS — Z945 Skin transplant status: Secondary | ICD-10-CM | POA: Diagnosis not present

## 2018-02-06 DIAGNOSIS — E11622 Type 2 diabetes mellitus with other skin ulcer: Secondary | ICD-10-CM | POA: Diagnosis not present

## 2018-02-06 DIAGNOSIS — E1151 Type 2 diabetes mellitus with diabetic peripheral angiopathy without gangrene: Secondary | ICD-10-CM | POA: Diagnosis not present

## 2018-02-06 DIAGNOSIS — L97822 Non-pressure chronic ulcer of other part of left lower leg with fat layer exposed: Secondary | ICD-10-CM | POA: Diagnosis not present

## 2018-02-06 DIAGNOSIS — I87332 Chronic venous hypertension (idiopathic) with ulcer and inflammation of left lower extremity: Secondary | ICD-10-CM | POA: Diagnosis not present

## 2018-02-06 DIAGNOSIS — I70261 Atherosclerosis of native arteries of extremities with gangrene, right leg: Secondary | ICD-10-CM | POA: Diagnosis not present

## 2018-02-08 DIAGNOSIS — Z48817 Encounter for surgical aftercare following surgery on the skin and subcutaneous tissue: Secondary | ICD-10-CM | POA: Diagnosis not present

## 2018-02-08 DIAGNOSIS — I2510 Atherosclerotic heart disease of native coronary artery without angina pectoris: Secondary | ICD-10-CM | POA: Diagnosis not present

## 2018-02-08 DIAGNOSIS — L97213 Non-pressure chronic ulcer of right calf with necrosis of muscle: Secondary | ICD-10-CM | POA: Diagnosis not present

## 2018-02-08 DIAGNOSIS — I87332 Chronic venous hypertension (idiopathic) with ulcer and inflammation of left lower extremity: Secondary | ICD-10-CM | POA: Diagnosis not present

## 2018-02-08 DIAGNOSIS — L97822 Non-pressure chronic ulcer of other part of left lower leg with fat layer exposed: Secondary | ICD-10-CM | POA: Diagnosis not present

## 2018-02-08 DIAGNOSIS — E1151 Type 2 diabetes mellitus with diabetic peripheral angiopathy without gangrene: Secondary | ICD-10-CM | POA: Diagnosis not present

## 2018-02-08 DIAGNOSIS — Z945 Skin transplant status: Secondary | ICD-10-CM | POA: Diagnosis not present

## 2018-02-08 DIAGNOSIS — E11622 Type 2 diabetes mellitus with other skin ulcer: Secondary | ICD-10-CM | POA: Diagnosis not present

## 2018-02-08 DIAGNOSIS — I70261 Atherosclerosis of native arteries of extremities with gangrene, right leg: Secondary | ICD-10-CM | POA: Diagnosis not present

## 2018-02-10 ENCOUNTER — Other Ambulatory Visit: Payer: Self-pay | Admitting: Nurse Practitioner

## 2018-02-11 DIAGNOSIS — L97822 Non-pressure chronic ulcer of other part of left lower leg with fat layer exposed: Secondary | ICD-10-CM | POA: Diagnosis not present

## 2018-02-11 DIAGNOSIS — E11622 Type 2 diabetes mellitus with other skin ulcer: Secondary | ICD-10-CM | POA: Diagnosis not present

## 2018-02-11 DIAGNOSIS — I87332 Chronic venous hypertension (idiopathic) with ulcer and inflammation of left lower extremity: Secondary | ICD-10-CM | POA: Diagnosis not present

## 2018-02-11 DIAGNOSIS — E1151 Type 2 diabetes mellitus with diabetic peripheral angiopathy without gangrene: Secondary | ICD-10-CM | POA: Diagnosis not present

## 2018-02-11 DIAGNOSIS — Z48817 Encounter for surgical aftercare following surgery on the skin and subcutaneous tissue: Secondary | ICD-10-CM | POA: Diagnosis not present

## 2018-02-11 DIAGNOSIS — Z945 Skin transplant status: Secondary | ICD-10-CM | POA: Diagnosis not present

## 2018-02-11 DIAGNOSIS — L97213 Non-pressure chronic ulcer of right calf with necrosis of muscle: Secondary | ICD-10-CM | POA: Diagnosis not present

## 2018-02-11 DIAGNOSIS — I2510 Atherosclerotic heart disease of native coronary artery without angina pectoris: Secondary | ICD-10-CM | POA: Diagnosis not present

## 2018-02-11 DIAGNOSIS — I70261 Atherosclerosis of native arteries of extremities with gangrene, right leg: Secondary | ICD-10-CM | POA: Diagnosis not present

## 2018-02-13 DIAGNOSIS — I87332 Chronic venous hypertension (idiopathic) with ulcer and inflammation of left lower extremity: Secondary | ICD-10-CM | POA: Diagnosis not present

## 2018-02-13 DIAGNOSIS — E11622 Type 2 diabetes mellitus with other skin ulcer: Secondary | ICD-10-CM | POA: Diagnosis not present

## 2018-02-13 DIAGNOSIS — L97213 Non-pressure chronic ulcer of right calf with necrosis of muscle: Secondary | ICD-10-CM | POA: Diagnosis not present

## 2018-02-13 DIAGNOSIS — L97822 Non-pressure chronic ulcer of other part of left lower leg with fat layer exposed: Secondary | ICD-10-CM | POA: Diagnosis not present

## 2018-02-13 DIAGNOSIS — E1151 Type 2 diabetes mellitus with diabetic peripheral angiopathy without gangrene: Secondary | ICD-10-CM | POA: Diagnosis not present

## 2018-02-13 DIAGNOSIS — Z945 Skin transplant status: Secondary | ICD-10-CM | POA: Diagnosis not present

## 2018-02-13 DIAGNOSIS — I2510 Atherosclerotic heart disease of native coronary artery without angina pectoris: Secondary | ICD-10-CM | POA: Diagnosis not present

## 2018-02-13 DIAGNOSIS — I70261 Atherosclerosis of native arteries of extremities with gangrene, right leg: Secondary | ICD-10-CM | POA: Diagnosis not present

## 2018-02-13 DIAGNOSIS — Z48817 Encounter for surgical aftercare following surgery on the skin and subcutaneous tissue: Secondary | ICD-10-CM | POA: Diagnosis not present

## 2018-02-13 NOTE — Progress Notes (Signed)
Cardiology Office Note  Date: 02/14/2018   ID: Emily Wall, DOB 01-15-1936, MRN [id number]  PCP: Chevis Pretty, FNP  Primary Cardiologist: Rozann Lesches, MD   Chief Complaint  Patient presents with  . Cardiomyopathy  . Aortic valve disease    History of Present Illness: Emily Wall is a 82 y.o. female last seen in May 2018.  She is here today with family member for a follow-up visit.  I reviewed her interval history.  She was treated for right leg wounds with prior history of venous stasis ulcers and skin grafting.  She was on broad-spectrum antibiotics and underwent debridement with placement of a wound VAC.  She also had an aortogram which did not reveal significant degree of stenosis to require revascularization.  She has been doing better although did manifest subsequent weight gain following an additional reduction in her Lasix dose.  She was subsequently seen by her PCP with up titration of Lasix and is losing weight back to her prior baseline which she feels was around 158-160 pounds.  I reviewed her medications.  Current cardiac regimen includes Coreg, lisinopril, Lipitor and Lasix presently at 60 mg daily.  Echocardiogram from June 2018 revealed LVEF 40-45% with moderate diastolic dysfunction and moderate aortic stenosis/regurgitation with mean gradient 26 mmHg.  We discussed obtaining a follow-up study in June.  Past Medical History:  Diagnosis Date  . Aortic regurgitation    Mild  . Aortic stenosis    Moderate  . Coronary atherosclerosis of native coronary artery    Nonobstructive 2007  . Essential hypertension, benign   . Hyperlipidemia   . Hypothyroidism   . Nonischemic cardiomyopathy (HCC)    LVEF improved to 50-55%  . Osteoporosis   . Type 2 diabetes mellitus ([place])     Past Surgical History:  Procedure Laterality Date  . LOWER EXTREMITY ANGIOGRAPHY N/A 11/13/2017   Procedure: LOWER EXTREMITY ANGIOGRAPHY;  Surgeon: Serafina Mitchell, MD;   Location: [place] [facility];  Service: Cardiovascular;  Laterality: N/A;  . SKIN GRAFT    . SKIN SPLIT GRAFT Right 11/09/2017   Procedure: RIGHT LEG DEBRIDE ACHILLES, SPLIT THICKNESS SKIN GRAFT, VAC;  Surgeon: Newt Minion, MD;  Location: [facility];  Service: Orthopedics;  Laterality: Right;    Current Outpatient Medications  Medication Sig Dispense Refill  . alendronate (FOSAMAX) 70 MG tablet TAKE 1 TABLET BY MOUTH ONCE A WEEK WITH A FULL GLASS OF WATER ON AN EMPTY STOMACH 12 tablet 1  . aspirin 325 MG tablet Take 325 mg by mouth daily.      Marland Kitchen atorvastatin (LIPITOR) 20 MG tablet TAKE 1 TABLET BY MOUTH EVERY DAY 90 tablet 1  . Calcium Carbonate-Vitamin D (CALCIUM + D PO) Take 1 tablet by mouth daily.      . carvedilol (COREG) 25 MG tablet TAKE 1 TABLET (25 MG TOTAL) BY MOUTH 2 (TWO) TIMES DAILY WITH A MEAL. 60 tablet 5  . cholecalciferol (VITAMIN D) 1000 UNITS tablet Take 1,000 Units by mouth daily.     . ferrous sulfate 325 (65 FE) MG tablet TAKE 1 TABLET BY MOUTH EVERY DAY WITH BREAKFAST 30 tablet 4  . lisinopril (PRINIVIL,ZESTRIL) 20 MG tablet TAKE 1 TABLET BY MOUTH EVERY DAY 30 tablet 5  . metFORMIN (GLUCOPHAGE) 1000 MG tablet TAKE 1 TABLET (1,000 MG TOTAL) BY MOUTH 2 (TWO) TIMES DAILY WITH A MEAL. 180 tablet 0  . oxyCODONE-acetaminophen (PERCOCET/ROXICET) 5-325 MG tablet Take 1 tablet by mouth 2 (two) times daily as  needed for severe pain. 20 tablet 0  . silver sulfADIAZINE (SILVADENE) 1 % cream Apply 1 application topically daily. 50 g 0  . sitaGLIPtin (JANUVIA) 100 MG tablet Take 100 mg daily by mouth.    . triamcinolone cream (KENALOG) 0.1 % APPLY TO AFFECTED AREA TWICE A DAY 454 g 3  . furosemide (LASIX) 40 MG tablet 1 po in am and 1/2 tablet at 1pm daily 45 tablet 5   No current facility-administered medications for this visit.    Allergies:  Patient has no known allergies.   Social History: The patient  reports that she quit smoking about 29 years ago. Her smoking use included  cigarettes. She has a 15.00 pack-year smoking history. she has never used smokeless tobacco. She reports that she does not drink alcohol or use drugs.   ROS:  Please see the history of present illness. Otherwise, complete review of systems is positive for improving abdominal fullness and leg swelling.  All other systems are reviewed and negative.   Physical Exam: VS:  BP 130/68   Pulse 76   Ht 5\' 5"  (1.651 m)   Wt 167 lb (75.8 kg)   SpO2 96%   BMI 27.79 kg/m , BMI Body mass index is 27.79 kg/m.  Wt Readings from Last 3 Encounters:  02/14/18 167 lb (75.8 kg)  02/14/18 181 lb (82.1 kg)  02/04/18 181 lb (82.1 kg)    General: Elderly woman,  appears comfortable at rest. HEENT: Conjunctiva and lids normal, oropharynx clear. Neck: Supple, no elevated JVP or carotid bruits. Lungs: Clear to auscultation, nonlabored breathing at rest. Cardiac: Regular rate and rhythm, 3/6 basal systolic murmur, no pericardial rub. Abdomen: Soft, nontender, bowel sounds present. Extremities: Right leg dressed with bandage, distal pulses 1-2+. Skin: Warm and dry. Musculoskeletal: No kyphosis. Neuropsychiatric: Alert and oriented x3, affect grossly appropriate.  ECG: I personally reviewed the tracing from 02/01/2018 which showed sinus rhythm with poor R wave progression and increased voltage, nonspecific ST changes.  Recent Labwork: 11/07/2017: Magnesium 1.9 02/01/2018: ALT 13; AST 22; B Natriuretic Peptide 2,338.0; BUN 10; Creatinine, Ser 0.65; Hemoglobin 9.9; Platelets 213; Potassium 3.8; Sodium 134     Component Value Date/Time   CHOL 70 11/05/2017 0539   CHOL 90 (L) 05/29/2017 1421   CHOL 119 06/02/2013 1318   TRIG 35 11/05/2017 0539   TRIG 50 02/01/2015 0913   TRIG 41 06/02/2013 1318   HDL 33 (L) 11/05/2017 0539   HDL 41 05/29/2017 1421   HDL 53 02/01/2015 0913   HDL 52 06/02/2013 1318   CHOLHDL 2.1 11/05/2017 0539   VLDL 7 11/05/2017 0539   LDLCALC 30 11/05/2017 0539   LDLCALC 38 05/29/2017  1421   LDLCALC 49 06/22/2014 1630   LDLCALC 59 06/02/2013 1318    Other Studies Reviewed Today:  Echocardiogram 06/13/2017: Study Conclusions  - Left ventricle: The cavity size was normal. There was focal basal   hypertrophy. Systolic function was mildly to moderately reduced.   The estimated ejection fraction was in the range of 40% to 45%.   Diffuse hypokinesis. Features are consistent with a pseudonormal   left ventricular filling pattern, with concomitant abnormal   relaxation and increased filling pressure (grade 2 diastolic   dysfunction). Doppler parameters are consistent with high   ventricular filling pressure. - Aortic valve: Severely calcified annulus. Severely thickened   leaflets. There was moderate stenosis. There was moderate   regurgitation. Mean gradient (S): 26 mm Hg. Valve area (VTI):   1.08  cm^2. Valve area (Vmax): 1.06 cm^2. Valve area (Vmean): 1.18   cm^2. - Mitral valve: Moderately calcified annulus. Normal thickness   leaflets . There was mild to moderate regurgitation. - Left atrium: The atrium was severely dilated. - Right atrium: The atrium was mildly dilated. - Atrial septum: No defect or patent foramen ovale was identified. - Tricuspid valve: There was moderate regurgitation. - Pulmonary arteries: Systolic pressure was moderately increased.   PA peak pressure: 41 mm Hg (S). - Pericardium, extracardiac: There is a small circumferential   pericardial effusion. There is no evidence of tamponade   physiology by echo.  Assessment and Plan:  1.  Nonischemic cardiomyopathy with LVEF 40-45% by echocardiogram in June 2018.  She continues on Coreg, lisinopril, and increased dose Lasix with improving fluid overload.  She has a follow-up visit scheduled with PCP.  Once her weight gets closer to 160 pounds would consider cutting Lasix back to 40 mg daily and then adjusting from there.  2.  Aortic stenosis and regurgitation, moderate by echocardiogram in June  2018.  We will obtain a follow-up study this June.  3.  Essential hypertension, no changes made to current regimen.  4.  Nonobstructive CAD, continue aspirin and statin.  No active angina.  Current medicines were reviewed with the patient today.   Orders Placed This Encounter  Procedures  . ECHOCARDIOGRAM COMPLETE     Disposition: Follow-up in June.  Signed, Satira Sark, MD, [facility] 02/14/2018 2:46 PM    [place] at [facility] [address], [place],  [address] Phone: [phone]; Fax: [phone]

## 2018-02-14 ENCOUNTER — Ambulatory Visit (INDEPENDENT_AMBULATORY_CARE_PROVIDER_SITE_OTHER): Payer: Medicare HMO | Admitting: Orthopedic Surgery

## 2018-02-14 ENCOUNTER — Ambulatory Visit (INDEPENDENT_AMBULATORY_CARE_PROVIDER_SITE_OTHER): Payer: Medicare HMO | Admitting: Cardiology

## 2018-02-14 ENCOUNTER — Encounter (INDEPENDENT_AMBULATORY_CARE_PROVIDER_SITE_OTHER): Payer: Self-pay | Admitting: Orthopedic Surgery

## 2018-02-14 ENCOUNTER — Encounter: Payer: Self-pay | Admitting: Cardiology

## 2018-02-14 VITALS — Ht 65.0 in | Wt 181.0 lb

## 2018-02-14 VITALS — BP 130/68 | HR 76 | Ht 65.0 in | Wt 167.0 lb

## 2018-02-14 DIAGNOSIS — I10 Essential (primary) hypertension: Secondary | ICD-10-CM

## 2018-02-14 DIAGNOSIS — Z945 Skin transplant status: Secondary | ICD-10-CM | POA: Diagnosis not present

## 2018-02-14 DIAGNOSIS — I350 Nonrheumatic aortic (valve) stenosis: Secondary | ICD-10-CM

## 2018-02-14 DIAGNOSIS — I2510 Atherosclerotic heart disease of native coronary artery without angina pectoris: Secondary | ICD-10-CM | POA: Diagnosis not present

## 2018-02-14 DIAGNOSIS — E8809 Other disorders of plasma-protein metabolism, not elsewhere classified: Secondary | ICD-10-CM

## 2018-02-14 DIAGNOSIS — I83012 Varicose veins of right lower extremity with ulcer of calf: Secondary | ICD-10-CM

## 2018-02-14 DIAGNOSIS — I429 Cardiomyopathy, unspecified: Secondary | ICD-10-CM | POA: Diagnosis not present

## 2018-02-14 DIAGNOSIS — L97909 Non-pressure chronic ulcer of unspecified part of unspecified lower leg with unspecified severity: Secondary | ICD-10-CM | POA: Diagnosis not present

## 2018-02-14 DIAGNOSIS — L97215 Non-pressure chronic ulcer of right calf with muscle involvement without evidence of necrosis: Secondary | ICD-10-CM | POA: Diagnosis not present

## 2018-02-14 DIAGNOSIS — E11622 Type 2 diabetes mellitus with other skin ulcer: Secondary | ICD-10-CM

## 2018-02-14 MED ORDER — OXYCODONE-ACETAMINOPHEN 5-325 MG PO TABS: 1.0000 | ORAL_TABLET | Freq: Two times a day (BID) | ORAL | 0 refills | Status: DC | PRN

## 2018-02-14 NOTE — Progress Notes (Signed)
Office Visit Note   Patient: Emily Wall           Date of Birth: Jan 10, 1936           MRN: [id number] Visit Date: 02/14/2018              Requested by: Chevis Pretty, [address] [place], [place] [address] PCP: Chevis Pretty, FNP  Chief Complaint  Patient presents with  . Right Leg - Wound Check, Follow-up, Pain    11/09/17 right Achilles debridement/skin graft.  . Left Leg - Edema      HPI: Patient is a 82 year old woman who presents in follow-up with venous insufficiency bilateral lower extremity status post skin graft for large necrotic ulcer posterior aspect of the right calf she currently has home health doing compression dressing changes twice a week the right lower extremity and once a week for the left lower extremity.  Assessment & Plan: Visit Diagnoses:  1. S/P split thickness skin graft   2. Venous stasis ulcer of right calf with muscle involvement without evidence of necrosis with varicose veins ([place])   3. Diabetic leg ulcer ([place])   4. Hypoalbuminemia     Plan: We will continue with home health dressing changes follow-up in the office in 2 weeks at which time we will advance her to medical compression stockings she will bring her stockings with her so we can see if these are appropriate.  Follow-Up Instructions: Return in about 2 weeks (around 02/28/2018).   Ortho Exam  Patient is alert, oriented, no adenopathy, well-dressed, normal affect, normal respiratory effort. Examination patient has decreased swelling in both legs she was taken the emergency room recently by EMS due to lethargy and she was diuresed and now feels better.  Her dose of Lasix has been increased.  She has decreased pitting edema in both legs there are no open ulcers in the left leg right leg shows good epithelialization of the mid aspect of the skin graft with some hyper granulation with small amount of fibrinous exudative tissue over the remaining granulation tissue  there is no exposed bone no exposed tendon no cellulitis no tenderness to palpation.  Imaging: No results found. No images are attached to the encounter.  Labs: Lab Results  Component Value Date   HGBA1C 8.1 (H) 11/03/2017   HGBA1C 6.7 02/15/2016   HGBA1C 7.2 11/15/2015   ESRSEDRATE 26 (H) 11/03/2017   CRP 1.8 (H) 11/03/2017   REPTSTATUS 11/08/2017 FINAL 11/03/2017   CULT NO GROWTH 5 DAYS 11/03/2017    @LABSALLVALUES (HGBA1)@  Body mass index is 30.12 kg/m.  Orders:  No orders of the defined types were placed in this encounter.  No orders of the defined types were placed in this encounter.    Procedures: No procedures performed  Clinical Data: No additional findings.  ROS:  All other systems negative, except as noted in the HPI. Review of Systems  Objective: Vital Signs: Ht 5\' 5"  (1.651 m)   Wt 181 lb (82.1 kg)   BMI 30.12 kg/m   Specialty Comments:  No specialty comments available.  PMFS History: Patient Active Problem List   Diagnosis Date Noted  . S/P split thickness skin graft 11/29/2017  . Diabetic leg ulcer ([place])   . Atherosclerosis of native arteries of extremities with gangrene, right leg ([place])   . Wound infection 11/03/2017  . Hypoalbuminemia 11/03/2017  . Venous stasis ulcer ([place]) 11/03/2017  . BMI 26.0-26.9,adult 11/15/2015  . Family history of malignant neoplasm of  gastrointestinal tract 06/30/2013  . Hyperlipidemia 06/02/2013  . Diabetes mellitus ([place]) 03/29/2011  . Osteoporosis 03/29/2011  . Iron deficiency anemia 12/29/2010  . Essential hypertension 09/08/2009  . Aortic valve disorder 09/08/2009  . Secondary cardiomyopathy ([place]) 09/08/2009   Past Medical History:  Diagnosis Date  . Aortic regurgitation    Mild  . Aortic stenosis    Moderate  . Coronary atherosclerosis of native coronary artery    Nonobstructive 2007  . Essential hypertension, benign   . Hyperlipidemia   . Hypothyroidism   . Nonischemic cardiomyopathy (HCC)      LVEF improved to 50-55%  . Osteoporosis   . Type 2 diabetes mellitus (HCC)     Family History  Problem Relation Age of Onset  . Colon cancer Mother   . Colon cancer Brother   . Diabetes Brother   . Prostate cancer Brother     Past Surgical History:  Procedure Laterality Date  . LOWER EXTREMITY ANGIOGRAPHY N/A 11/13/2017   Procedure: LOWER EXTREMITY ANGIOGRAPHY;  Surgeon: Serafina Mitchell, MD;  Location: [place] [facility];  Service: Cardiovascular;  Laterality: N/A;  . SKIN GRAFT    . SKIN SPLIT GRAFT Right 11/09/2017   Procedure: RIGHT LEG DEBRIDE ACHILLES, SPLIT THICKNESS SKIN GRAFT, VAC;  Surgeon: Newt Minion, MD;  Location: [place];  Service: Orthopedics;  Laterality: Right;   Social History   Occupational History    Employer: RETIRED  Tobacco Use  . Smoking status: Former Smoker    Packs/day: 0.50    Years: 30.00    Pack years: 15.00    Types: Cigarettes    Last attempt to quit: 12/25/1988    Years since quitting: 29.1  . Smokeless tobacco: Never Used  Substance and Sexual Activity  . Alcohol use: No    Alcohol/week: 0.0 oz  . Drug use: No  . Sexual activity: No

## 2018-02-14 NOTE — Patient Instructions (Signed)
Your physician recommends that you schedule a follow-up appointment in: June with Dr Domenic Polite after echo   Your physician has requested that you have an echocardiogram in June . Echocardiography is a painless test that uses sound waves to create images of your heart. It provides your doctor with information about the size and shape of your heart and how well your heart's chambers and valves are working. This procedure takes approximately one hour. There are no restrictions for this procedure.    Your physician recommends that you continue on your current medications as directed. Please refer to the Current Medication list given to you today.    If you need a refill on your cardiac medications before your next appointment, please call your pharmacy.      No lab work ordered     Thank you for choosing [facility] !

## 2018-02-14 NOTE — Addendum Note (Signed)
Addended by: Dondra Prader R on: 02/14/2018 10:22 AM   Modules accepted: Orders

## 2018-02-15 DIAGNOSIS — Z48817 Encounter for surgical aftercare following surgery on the skin and subcutaneous tissue: Secondary | ICD-10-CM | POA: Diagnosis not present

## 2018-02-15 DIAGNOSIS — L97822 Non-pressure chronic ulcer of other part of left lower leg with fat layer exposed: Secondary | ICD-10-CM | POA: Diagnosis not present

## 2018-02-15 DIAGNOSIS — Z945 Skin transplant status: Secondary | ICD-10-CM | POA: Diagnosis not present

## 2018-02-15 DIAGNOSIS — E1151 Type 2 diabetes mellitus with diabetic peripheral angiopathy without gangrene: Secondary | ICD-10-CM | POA: Diagnosis not present

## 2018-02-15 DIAGNOSIS — L97213 Non-pressure chronic ulcer of right calf with necrosis of muscle: Secondary | ICD-10-CM | POA: Diagnosis not present

## 2018-02-15 DIAGNOSIS — I87332 Chronic venous hypertension (idiopathic) with ulcer and inflammation of left lower extremity: Secondary | ICD-10-CM | POA: Diagnosis not present

## 2018-02-15 DIAGNOSIS — I70261 Atherosclerosis of native arteries of extremities with gangrene, right leg: Secondary | ICD-10-CM | POA: Diagnosis not present

## 2018-02-15 DIAGNOSIS — E11622 Type 2 diabetes mellitus with other skin ulcer: Secondary | ICD-10-CM | POA: Diagnosis not present

## 2018-02-15 DIAGNOSIS — I2510 Atherosclerotic heart disease of native coronary artery without angina pectoris: Secondary | ICD-10-CM | POA: Diagnosis not present

## 2018-02-18 ENCOUNTER — Ambulatory Visit: Payer: Medicare HMO | Admitting: Nurse Practitioner

## 2018-02-18 DIAGNOSIS — I87332 Chronic venous hypertension (idiopathic) with ulcer and inflammation of left lower extremity: Secondary | ICD-10-CM | POA: Diagnosis not present

## 2018-02-18 DIAGNOSIS — E11622 Type 2 diabetes mellitus with other skin ulcer: Secondary | ICD-10-CM | POA: Diagnosis not present

## 2018-02-18 DIAGNOSIS — Z945 Skin transplant status: Secondary | ICD-10-CM | POA: Diagnosis not present

## 2018-02-18 DIAGNOSIS — L97822 Non-pressure chronic ulcer of other part of left lower leg with fat layer exposed: Secondary | ICD-10-CM | POA: Diagnosis not present

## 2018-02-18 DIAGNOSIS — E1151 Type 2 diabetes mellitus with diabetic peripheral angiopathy without gangrene: Secondary | ICD-10-CM | POA: Diagnosis not present

## 2018-02-18 DIAGNOSIS — I2510 Atherosclerotic heart disease of native coronary artery without angina pectoris: Secondary | ICD-10-CM | POA: Diagnosis not present

## 2018-02-18 DIAGNOSIS — I70261 Atherosclerosis of native arteries of extremities with gangrene, right leg: Secondary | ICD-10-CM | POA: Diagnosis not present

## 2018-02-18 DIAGNOSIS — Z48817 Encounter for surgical aftercare following surgery on the skin and subcutaneous tissue: Secondary | ICD-10-CM | POA: Diagnosis not present

## 2018-02-18 DIAGNOSIS — L97213 Non-pressure chronic ulcer of right calf with necrosis of muscle: Secondary | ICD-10-CM | POA: Diagnosis not present

## 2018-02-19 ENCOUNTER — Ambulatory Visit (INDEPENDENT_AMBULATORY_CARE_PROVIDER_SITE_OTHER): Payer: Medicare HMO | Admitting: Nurse Practitioner

## 2018-02-19 ENCOUNTER — Encounter: Payer: Self-pay | Admitting: Nurse Practitioner

## 2018-02-19 VITALS — BP 132/78 | HR 83 | Temp 97.3°F | Ht 65.0 in | Wt 169.0 lb

## 2018-02-19 DIAGNOSIS — I872 Venous insufficiency (chronic) (peripheral): Secondary | ICD-10-CM

## 2018-02-19 NOTE — Patient Instructions (Signed)

## 2018-02-19 NOTE — Progress Notes (Signed)
   Subjective:    Patient ID: Emily Wall, female    DOB: June 03, 1936, 82 y.o.   MRN: [id number]  HPI patient was seen on 02/04/18 with lower ext fluid retention. Her lasix was increased to 40mg  in  Morning and 20mg  around 1pm. She was also told to do daily weights and if she had a 2lb weight gain in 24 hours to let me know. She has bad venous insufficiency and has had some cellulitis that resulted in split thickness skin graft late last year. She had follow up with ortho on 02/14/18, dressing was changed and she is to follow up next week. She also saw cardiology on 02/14/18. He agreed with increase in lasix but said once weight gets to 160 to go back to just 40mg  a day. No changes were made. Her swelling is much better. Weight is down 12 lbs.   Review of Systems  Constitutional: Negative.   Cardiovascular: Positive for leg swelling. Negative for chest pain and palpitations.  Genitourinary: Negative.   Neurological: Negative.   Psychiatric/Behavioral: Negative.   All other systems reviewed and are negative.      Objective:   Physical Exam  Constitutional: She appears well-developed and well-nourished. No distress.  Cardiovascular: Normal rate.  Murmur (3/6 systolic murmur) heard. Musculoskeletal: She exhibits edema (1+ edema).  Skin: Skin is warm and dry.  Both lower ext are wrapped so not able to visualize skin of lower ext today  Psychiatric: She has a normal mood and affect. Her behavior is normal. Judgment and thought content normal.    BP 132/78   Pulse 83   Temp (!) 97.3 F (36.3 C) (Oral)   Ht 5\' 5"  (1.651 m)   Wt 169 lb (76.7 kg)   BMI 28.12 kg/m        Assessment & Plan:  1. Venous stasis dermatitis of both lower extremities Continue lasix 40mg  daily and 2omg at 1pm daily Continue daily weights Keep follow up with ortho to recheck skin graft RTO in 1 month  [place], FNP

## 2018-02-20 DIAGNOSIS — I87332 Chronic venous hypertension (idiopathic) with ulcer and inflammation of left lower extremity: Secondary | ICD-10-CM | POA: Diagnosis not present

## 2018-02-20 DIAGNOSIS — Z945 Skin transplant status: Secondary | ICD-10-CM | POA: Diagnosis not present

## 2018-02-20 DIAGNOSIS — I2510 Atherosclerotic heart disease of native coronary artery without angina pectoris: Secondary | ICD-10-CM | POA: Diagnosis not present

## 2018-02-20 DIAGNOSIS — L97213 Non-pressure chronic ulcer of right calf with necrosis of muscle: Secondary | ICD-10-CM | POA: Diagnosis not present

## 2018-02-20 DIAGNOSIS — I70261 Atherosclerosis of native arteries of extremities with gangrene, right leg: Secondary | ICD-10-CM | POA: Diagnosis not present

## 2018-02-20 DIAGNOSIS — E1151 Type 2 diabetes mellitus with diabetic peripheral angiopathy without gangrene: Secondary | ICD-10-CM | POA: Diagnosis not present

## 2018-02-20 DIAGNOSIS — L97822 Non-pressure chronic ulcer of other part of left lower leg with fat layer exposed: Secondary | ICD-10-CM | POA: Diagnosis not present

## 2018-02-20 DIAGNOSIS — E11622 Type 2 diabetes mellitus with other skin ulcer: Secondary | ICD-10-CM | POA: Diagnosis not present

## 2018-02-20 DIAGNOSIS — Z48817 Encounter for surgical aftercare following surgery on the skin and subcutaneous tissue: Secondary | ICD-10-CM | POA: Diagnosis not present

## 2018-02-22 ENCOUNTER — Telehealth: Payer: Self-pay

## 2018-02-22 DIAGNOSIS — Z945 Skin transplant status: Secondary | ICD-10-CM | POA: Diagnosis not present

## 2018-02-22 DIAGNOSIS — E11622 Type 2 diabetes mellitus with other skin ulcer: Secondary | ICD-10-CM | POA: Diagnosis not present

## 2018-02-22 DIAGNOSIS — L97213 Non-pressure chronic ulcer of right calf with necrosis of muscle: Secondary | ICD-10-CM | POA: Diagnosis not present

## 2018-02-22 DIAGNOSIS — E1151 Type 2 diabetes mellitus with diabetic peripheral angiopathy without gangrene: Secondary | ICD-10-CM | POA: Diagnosis not present

## 2018-02-22 DIAGNOSIS — I2510 Atherosclerotic heart disease of native coronary artery without angina pectoris: Secondary | ICD-10-CM | POA: Diagnosis not present

## 2018-02-22 DIAGNOSIS — I70261 Atherosclerosis of native arteries of extremities with gangrene, right leg: Secondary | ICD-10-CM | POA: Diagnosis not present

## 2018-02-22 DIAGNOSIS — I87332 Chronic venous hypertension (idiopathic) with ulcer and inflammation of left lower extremity: Secondary | ICD-10-CM | POA: Diagnosis not present

## 2018-02-22 DIAGNOSIS — L97822 Non-pressure chronic ulcer of other part of left lower leg with fat layer exposed: Secondary | ICD-10-CM | POA: Diagnosis not present

## 2018-02-22 DIAGNOSIS — Z48817 Encounter for surgical aftercare following surgery on the skin and subcutaneous tissue: Secondary | ICD-10-CM | POA: Diagnosis not present

## 2018-02-22 NOTE — Telephone Encounter (Signed)
Emily Wall with [facility] called office to give update on patient. Weight today was 165.3. Yesterday her weight was 162.3 and on 2/21 it was 165.1. She has no abdominal edema but does have 3+ edema to thighs. Legs are looking good. Her right lower leg is wrapped in profore and the left lower leg is wrapped in an unna boot. It you need to reach the home health nurse her number is [phone].

## 2018-02-25 DIAGNOSIS — L97213 Non-pressure chronic ulcer of right calf with necrosis of muscle: Secondary | ICD-10-CM | POA: Diagnosis not present

## 2018-02-25 DIAGNOSIS — Z48817 Encounter for surgical aftercare following surgery on the skin and subcutaneous tissue: Secondary | ICD-10-CM | POA: Diagnosis not present

## 2018-02-25 DIAGNOSIS — I87332 Chronic venous hypertension (idiopathic) with ulcer and inflammation of left lower extremity: Secondary | ICD-10-CM | POA: Diagnosis not present

## 2018-02-25 DIAGNOSIS — I2510 Atherosclerotic heart disease of native coronary artery without angina pectoris: Secondary | ICD-10-CM | POA: Diagnosis not present

## 2018-02-25 DIAGNOSIS — I70261 Atherosclerosis of native arteries of extremities with gangrene, right leg: Secondary | ICD-10-CM | POA: Diagnosis not present

## 2018-02-25 DIAGNOSIS — Z945 Skin transplant status: Secondary | ICD-10-CM | POA: Diagnosis not present

## 2018-02-25 DIAGNOSIS — E11622 Type 2 diabetes mellitus with other skin ulcer: Secondary | ICD-10-CM | POA: Diagnosis not present

## 2018-02-25 DIAGNOSIS — L97822 Non-pressure chronic ulcer of other part of left lower leg with fat layer exposed: Secondary | ICD-10-CM | POA: Diagnosis not present

## 2018-02-25 DIAGNOSIS — E1151 Type 2 diabetes mellitus with diabetic peripheral angiopathy without gangrene: Secondary | ICD-10-CM | POA: Diagnosis not present

## 2018-02-25 NOTE — Telephone Encounter (Signed)
Ok so down from [employer]

## 2018-02-25 NOTE — Telephone Encounter (Signed)
Please check on patients weight today

## 2018-02-25 NOTE — Telephone Encounter (Signed)
Called and spoke with patient.  Patient states that she weights 164.2lbs today

## 2018-02-26 ENCOUNTER — Encounter: Payer: Self-pay | Admitting: *Deleted

## 2018-02-26 ENCOUNTER — Ambulatory Visit (INDEPENDENT_AMBULATORY_CARE_PROVIDER_SITE_OTHER): Payer: Medicare HMO

## 2018-02-26 ENCOUNTER — Ambulatory Visit (INDEPENDENT_AMBULATORY_CARE_PROVIDER_SITE_OTHER): Payer: Medicare HMO | Admitting: *Deleted

## 2018-02-26 VITALS — BP 162/78 | HR 81 | Ht 65.0 in | Wt 169.0 lb

## 2018-02-26 DIAGNOSIS — M810 Age-related osteoporosis without current pathological fracture: Secondary | ICD-10-CM

## 2018-02-26 DIAGNOSIS — Z0000 Encounter for general adult medical examination without abnormal findings: Secondary | ICD-10-CM | POA: Diagnosis not present

## 2018-02-26 MED ORDER — BLOOD GLUCOSE METER KIT: PACK | 0 refills | Status: DC

## 2018-02-26 MED ORDER — BLOOD GLUCOSE METER KIT: PACK | 0 refills | Status: AC

## 2018-02-26 NOTE — Patient Instructions (Signed)
  Emily Wall , Thank you for taking time to come for your Medicare Wellness Visit. I appreciate your ongoing commitment to your health goals. Please review the following plan we discussed and let me know if I can assist you in the future.   These are the goals we discussed: Goals    . Exercise 150 min/wk Moderate Activity       This is a list of the screening recommended for you and due dates:  Health Maintenance  Topic Date Due  . Eye exam for diabetics  03/09/2017  . Hemoglobin A1C  05/03/2018  . Complete foot exam   05/29/2018  . Tetanus Vaccine  03/28/2021  . DEXA scan (bone density measurement)  Completed  . Pneumonia vaccines  Completed  . Flu Shot  Discontinued    Contact the Department of Health and Human Services to see if they can help with building a ramp onto your porch Phone: [phone] for Adult Services, Prevention, and Child Welfare: [place], [place]. [phone]    Review the paperwork for the extra help program through Medicare to help with drug costs

## 2018-02-26 NOTE — Progress Notes (Addendum)
Subjective:   Emily Wall is a 82 y.o. female who presents for a subsequent Medicare Annual Wellness Visit. Emily Wall comes in today with her daughter. She lives with her daughter and grandson. Her daughter cleans and prepares meals. She lives in a two story home but does not have to go upstairs. She does have 5 steps onto the pack of her porch and 3 in the front. She does not have a rail.   Review of Systems    Health is about the same as last year.   Cardiac Risk Factors include: advanced age (>25mn, >>17women);sedentary lifestyle;diabetes mellitus;dyslipidemia;hypertension. Bilateral lower extremity edema that is improving.   Skin: healing wound on right lower leg. Home health dresses it   Other systems negative.      Objective:    Today's Vitals   02/26/18 1018  BP: (!) 162/78  Pulse: 81  Weight: 169 lb (76.7 kg)  Height: 5' 5"  (1.651 m)   Body mass index is 28.12 kg/m.  Advanced Directives 02/01/2018 11/02/2017 11/01/2017 11/01/2017 02/01/2015 10/26/2014 10/26/2014  Does Patient Have a Medical Advance Directive? No No No No No Yes No  Would patient like information on creating a medical advance directive? No - Patient declined No - Patient declined No - Patient declined No - Patient declined Yes - EScientist, clinical (histocompatibility and immunogenetics)given - Yes - Educational materials given    Current Medications (verified) Outpatient Encounter Medications as of 02/26/2018  Medication Sig  . alendronate (FOSAMAX) 70 MG tablet TAKE 1 TABLET BY MOUTH ONCE A WEEK WITH A FULL GLASS OF WATER ON AN EMPTY STOMACH  . aspirin 325 MG tablet Take 325 mg by mouth daily.    .Marland Kitchenatorvastatin (LIPITOR) 20 MG tablet TAKE 1 TABLET BY MOUTH EVERY DAY  . Calcium Carbonate-Vitamin D (CALCIUM + D PO) Take 1 tablet by mouth daily.    . carvedilol (COREG) 25 MG tablet TAKE 1 TABLET (25 MG TOTAL) BY MOUTH 2 (TWO) TIMES DAILY WITH A MEAL.  . cholecalciferol (VITAMIN D) 1000 UNITS tablet Take 1,000 Units by mouth daily.   .  ferrous sulfate 325 (65 FE) MG tablet TAKE 1 TABLET BY MOUTH EVERY DAY WITH BREAKFAST  . furosemide (LASIX) 40 MG tablet 1 po in am and 1/2 tablet at 1pm daily  . lisinopril (PRINIVIL,ZESTRIL) 20 MG tablet TAKE 1 TABLET BY MOUTH EVERY DAY  . metFORMIN (GLUCOPHAGE) 1000 MG tablet TAKE 1 TABLET (1,000 MG TOTAL) BY MOUTH 2 (TWO) TIMES DAILY WITH A MEAL.  .Marland KitchenoxyCODONE-acetaminophen (PERCOCET/ROXICET) 5-325 MG tablet Take 1 tablet by mouth 2 (two) times daily as needed for severe pain.  . silver sulfADIAZINE (SILVADENE) 1 % cream Apply 1 application topically daily.  .Marland Kitchentriamcinolone cream (KENALOG) 0.1 % APPLY TO AFFECTED AREA TWICE A DAY  . blood glucose meter kit and supplies Dispense based on patient and insurance preference. Use up to 2 times. (FOR ICD-10 E10.9, E11.9).  . sitaGLIPtin (JANUVIA) 100 MG tablet Take 100 mg daily by mouth.  . [DISCONTINUED] blood glucose meter kit and supplies Dispense based on patient and insurance preference. Use up to 2 times. (FOR ICD-10 E10.9, E11.9).   No facility-administered encounter medications on file as of 02/26/2018.     Allergies (verified) Patient has no known allergies.   History: Past Medical History:  Diagnosis Date  . Aortic regurgitation    Mild  . Aortic stenosis    Moderate  . Coronary atherosclerosis of native coronary artery    Nonobstructive  2007  . Essential hypertension, benign   . Hyperlipidemia   . Hypothyroidism   . Nonischemic cardiomyopathy (HCC)    LVEF improved to 50-55%  . Osteoporosis   . Type 2 diabetes mellitus ([place])    Past Surgical History:  Procedure Laterality Date  . LOWER EXTREMITY ANGIOGRAPHY N/A 11/13/2017   Procedure: LOWER EXTREMITY ANGIOGRAPHY;  Surgeon: Serafina Mitchell, MD;  Location: [facility];  Service: Cardiovascular;  Laterality: N/A;  . SKIN GRAFT    . SKIN SPLIT GRAFT Right 11/09/2017   Procedure: RIGHT LEG DEBRIDE ACHILLES, SPLIT THICKNESS SKIN GRAFT, VAC;  Surgeon: Newt Minion, MD;   Location: [place];  Service: Orthopedics;  Laterality: Right;   Family History  Problem Relation Age of Onset  . Colon cancer Mother   . Colon cancer Brother   . Diabetes Brother   . Heart disease Brother   . Diabetes Brother   . Cancer Brother        prostate  . Prostate cancer Brother   . Cancer Brother   . Cancer Sister        stomach  . Heart failure Daughter   . Thyroid disease Daughter   . Cancer Sister        lung  . Heart disease Sister    Social History   Socioeconomic History  . Marital status: Widowed    Spouse name: Not on file  . Number of children: 2  . Years of education: 44  . Highest education level: 11th grade  Social Needs  . Financial resource strain: Not very hard  . Food insecurity - worry: Never true  . Food insecurity - inability: Never true  . Transportation needs - medical: No  . Transportation needs - non-medical: No  Occupational History    Employer: RETIRED    CommentYouth worker  Tobacco Use  . Smoking status: Former Smoker    Packs/day: 0.50    Years: 30.00    Pack years: 15.00    Types: Cigarettes    Last attempt to quit: 12/25/1988    Years since quitting: 29.1  . Smokeless tobacco: Never Used  Substance and Sexual Activity  . Alcohol use: No    Alcohol/week: 0.0 oz  . Drug use: No  . Sexual activity: No  Other Topics Concern  . Not on file  Social History Narrative  . Not on file   Clinical Intake:    Pain : 0-10 Pain Type: Chronic pain Pain Location: Leg Pain Orientation: Right Pain Descriptors / Indicators: Aching Pain Onset: More than a month ago Pain Frequency: Intermittent Effect of Pain on Daily Activities: minimal    Nutritional Status: BMI of 19-24  Normal Diabetes: No  How often do you need to have someone help you when you read instructions, pamphlets, or other written materials from your doctor or pharmacy?: 1 - Never What is the last grade level you completed in school?: 11  Interpreter  Needed?: No  Information entered by :: Chong Sicilian, RN   Activities of Daily Living In your present state of health, do you have any difficulty performing the following activities: 02/26/2018 11/03/2017  Hearing? N -  Vision? N -  Comment eye exam is coming up -  Difficulty concentrating or making decisions? N -  Walking or climbing stairs? Y -  Comment Uses a walker -  Dressing or bathing? N -  Doing errands, shopping? N Y  Conservation officer, nature and eating ? N -  Using the Toilet? N -  In the past six months, have you accidently leaked urine? N -  Do you have problems with loss of bowel control? N -  Managing your Medications? N -  Managing your Finances? N -  Housekeeping or managing your Housekeeping? Y -  Comment Has help from daughter -  Some recent data might be hidden     Immunizations and Health Maintenance Immunization History  Administered Date(s) Administered  . Pneumococcal Conjugate-13 08/03/2015  . Pneumococcal Polysaccharide-23 03/29/2011  . Td 03/29/2011   Health Maintenance Due  Topic Date Due  . OPHTHALMOLOGY EXAM  03/09/2017    Patient Care Team: Chevis Pretty, FNP as PCP - General (Nurse Practitioner) Newt Minion, MD as Consulting Physician (Orthopedic Surgery)    ED visit 02/01/18 for heart failure and peripheral edema. ED to admission on 11/02/17 for diabetic leg ulcer. This is managed by Dr Sharol Given now.   Assessment:   This is a routine wellness examination for Tene.  Hearing/Vision screen No deficits noted during visit.   Dietary issues and exercise activities discussed: Current Exercise Habits: Home exercise routine, Type of exercise: stretching;calisthenics, Time (Minutes): 10, Frequency (Times/Week): 7, Weekly Exercise (Minutes/Week): 70, Intensity: Mild, Exercise limited by: orthopedic condition(s)  Goals    . Exercise 150 min/wk Moderate Activity      Depression Screen PHQ 2/9 Scores 02/26/2018 02/04/2018 01/08/2018 01/03/2018  11/22/2017 10/30/2017 10/25/2017  PHQ - 2 Score 0 0 0 0 0 0 0    Fall Risk Fall Risk  02/26/2018 02/04/2018 01/08/2018 01/03/2018 11/22/2017  Falls in the past year? No No No No No  Number falls in past yr: - - - - -  Injury with Fall? - - - - -    Cognitive Function: MMSE - Mini Mental State Exam 02/26/2018  Orientation to time 3  Orientation to Place 5  Registration 3  Attention/ Calculation 4  Recall 2  Language- name 2 objects 2  Language- repeat 1  Language- follow 3 step command 3  Language- read & follow direction 1  Write a sentence 1  Copy design 0  Total score 25        Screening Tests Health Maintenance  Topic Date Due  . OPHTHALMOLOGY EXAM  03/09/2017  . HEMOGLOBIN A1C  05/03/2018  . FOOT EXAM  05/29/2018  . TETANUS/TDAP  03/28/2021  . DEXA SCAN  Completed  . PNA vac Low Risk Adult  Completed  . INFLUENZA VACCINE  Discontinued      Plan:  Extra help information given to help with medication costs Will let us know if she wants a an appt with audiology Phone number given for [facility] Adult services department to see if they can connect her with resources to build a ramp or rails onto her porch.  Keep f/u with PCP Dexa scan done today  I have personally reviewed and noted the following in the patient's chart:   . Medical and social history . Use of alcohol, tobacco or illicit drugs  . Current medications and supplements . Functional ability and status . Nutritional status . Physical activity . Advanced directives . List of other physicians . Hospitalizations, surgeries, and ER visits in previous 12 months . Vitals . Screenings to include cognitive, depression, and falls . Referrals and appointments  In addition, I have reviewed and discussed with patient certain preventive protocols, quality metrics, and best practice recommendations. A written personalized care plan for preventive services as well as general preventive  health recommendations were provided  to patient.     Chong Sicilian, RN   02/26/2018   I have reviewed and agree with the above AWV documentation.   Mary-Margaret Hassell Done, FNP

## 2018-02-27 DIAGNOSIS — I87332 Chronic venous hypertension (idiopathic) with ulcer and inflammation of left lower extremity: Secondary | ICD-10-CM | POA: Diagnosis not present

## 2018-02-27 DIAGNOSIS — E1151 Type 2 diabetes mellitus with diabetic peripheral angiopathy without gangrene: Secondary | ICD-10-CM | POA: Diagnosis not present

## 2018-02-27 DIAGNOSIS — I2510 Atherosclerotic heart disease of native coronary artery without angina pectoris: Secondary | ICD-10-CM | POA: Diagnosis not present

## 2018-02-27 DIAGNOSIS — E11622 Type 2 diabetes mellitus with other skin ulcer: Secondary | ICD-10-CM | POA: Diagnosis not present

## 2018-02-27 DIAGNOSIS — I70261 Atherosclerosis of native arteries of extremities with gangrene, right leg: Secondary | ICD-10-CM | POA: Diagnosis not present

## 2018-02-27 DIAGNOSIS — Z945 Skin transplant status: Secondary | ICD-10-CM | POA: Diagnosis not present

## 2018-02-27 DIAGNOSIS — Z48817 Encounter for surgical aftercare following surgery on the skin and subcutaneous tissue: Secondary | ICD-10-CM | POA: Diagnosis not present

## 2018-02-27 DIAGNOSIS — L97213 Non-pressure chronic ulcer of right calf with necrosis of muscle: Secondary | ICD-10-CM | POA: Diagnosis not present

## 2018-02-27 DIAGNOSIS — L97822 Non-pressure chronic ulcer of other part of left lower leg with fat layer exposed: Secondary | ICD-10-CM | POA: Diagnosis not present

## 2018-02-28 ENCOUNTER — Encounter (INDEPENDENT_AMBULATORY_CARE_PROVIDER_SITE_OTHER): Payer: Self-pay | Admitting: Orthopedic Surgery

## 2018-02-28 ENCOUNTER — Ambulatory Visit (INDEPENDENT_AMBULATORY_CARE_PROVIDER_SITE_OTHER): Payer: Medicare HMO | Admitting: Orthopedic Surgery

## 2018-02-28 VITALS — Ht 65.0 in | Wt 169.0 lb

## 2018-02-28 DIAGNOSIS — L97215 Non-pressure chronic ulcer of right calf with muscle involvement without evidence of necrosis: Secondary | ICD-10-CM

## 2018-02-28 DIAGNOSIS — I83012 Varicose veins of right lower extremity with ulcer of calf: Secondary | ICD-10-CM

## 2018-02-28 DIAGNOSIS — Z945 Skin transplant status: Secondary | ICD-10-CM

## 2018-02-28 NOTE — Progress Notes (Signed)
Office Visit Note   Patient: Emily Wall           Date of Birth: 1936/11/30           MRN: [id number] Visit Date: 02/28/2018              Requested by: Chevis Pretty, [address] [place], [place] [id number] PCP: Chevis Pretty, FNP  Chief Complaint  Patient presents with  . Right Leg - Follow-up    11/09/17 right achilles debridement and skin graft      HPI: Patient presents for follow-up status post split-thickness skin graft for massive ulcer right leg.  Patient has been using the compression wraps.  Assessment & Plan: Visit Diagnoses:  1. Venous stasis ulcer of right calf with muscle involvement without evidence of necrosis with varicose veins ([place])   2. S/P split thickness skin graft     Plan: Patient's ulcer has improved significantly.  We will transition her to the medical compression stockings she will wear these around the clock wash her leg with soap and water daily.  Follow-Up Instructions: Return in about 2 weeks (around 03/14/2018).   Ortho Exam  Patient is alert, oriented, no adenopathy, well-dressed, normal affect, normal respiratory effort. Examination patient swelling has resolved significantly in both legs there is no ulcers in the left leg right leg there is excellent granulation tissue with good epithelialization around the wound edges.  There is no depth to the wound there is no exposed tendon no exposed bone no cellulitis no drainage no odor.  Imaging: No results found.   Labs: Lab Results  Component Value Date   HGBA1C 8.1 (H) 11/03/2017   HGBA1C 6.7 02/15/2016   HGBA1C 7.2 11/15/2015   ESRSEDRATE 26 (H) 11/03/2017   CRP 1.8 (H) 11/03/2017   REPTSTATUS 11/08/2017 FINAL 11/03/2017   CULT NO GROWTH 5 DAYS 11/03/2017    @LABSALLVALUES (HGBA1)@  Body mass index is 28.12 kg/m.  Orders:  No orders of the defined types were placed in this encounter.  No orders of the defined types were placed in this  encounter.    Procedures: No procedures performed  Clinical Data: No additional findings.  ROS:  All other systems negative, except as noted in the HPI. Review of Systems  Objective: Vital Signs: Ht 5\' 5"  (1.651 m)   Wt 169 lb (76.7 kg)   BMI 28.12 kg/m   Specialty Comments:  No specialty comments available.  PMFS History: Patient Active Problem List   Diagnosis Date Noted  . S/P split thickness skin graft 11/29/2017  . Diabetic leg ulcer ([place])   . Atherosclerosis of native arteries of extremities with gangrene, right leg ([place])   . Wound infection 11/03/2017  . Hypoalbuminemia 11/03/2017  . Venous stasis ulcer ([place]) 11/03/2017  . BMI 26.0-26.9,adult 11/15/2015  . Family history of malignant neoplasm of gastrointestinal tract 06/30/2013  . Hyperlipidemia 06/02/2013  . Diabetes mellitus ([place]) 03/29/2011  . Osteoporosis 03/29/2011  . Iron deficiency anemia 12/29/2010  . Essential hypertension 09/08/2009  . Aortic valve disorder 09/08/2009  . Secondary cardiomyopathy ([place]) 09/08/2009   Past Medical History:  Diagnosis Date  . Aortic regurgitation    Mild  . Aortic stenosis    Moderate  . Coronary atherosclerosis of native coronary artery    Nonobstructive 2007  . Essential hypertension, benign   . Hyperlipidemia   . Hypothyroidism   . Nonischemic cardiomyopathy (HCC)    LVEF improved to 50-55%  . Osteoporosis   . Type 2  diabetes mellitus ([place])     Family History  Problem Relation Age of Onset  . Colon cancer Mother   . Colon cancer Brother   . Diabetes Brother   . Heart disease Brother   . Diabetes Brother   . Cancer Brother        prostate  . Prostate cancer Brother   . Cancer Brother   . Cancer Sister        stomach  . Heart failure Daughter   . Thyroid disease Daughter   . Cancer Sister        lung  . Heart disease Sister     Past Surgical History:  Procedure Laterality Date  . LOWER EXTREMITY ANGIOGRAPHY N/A 11/13/2017   Procedure: LOWER  EXTREMITY ANGIOGRAPHY;  Surgeon: Serafina Mitchell, MD;  Location: [facility];  Service: Cardiovascular;  Laterality: N/A;  . SKIN GRAFT    . SKIN SPLIT GRAFT Right 11/09/2017   Procedure: RIGHT LEG DEBRIDE ACHILLES, SPLIT THICKNESS SKIN GRAFT, VAC;  Surgeon: Newt Minion, MD;  Location: [facility];  Service: Orthopedics;  Laterality: Right;   Social History   Occupational History    Employer: RETIRED    CommentYouth worker  Tobacco Use  . Smoking status: Former Smoker    Packs/day: 0.50    Years: 30.00    Pack years: 15.00    Types: Cigarettes    Last attempt to quit: 12/25/1988    Years since quitting: 29.1  . Smokeless tobacco: Never Used  Substance and Sexual Activity  . Alcohol use: No    Alcohol/week: 0.0 oz  . Drug use: No  . Sexual activity: No

## 2018-03-01 DIAGNOSIS — I70261 Atherosclerosis of native arteries of extremities with gangrene, right leg: Secondary | ICD-10-CM | POA: Diagnosis not present

## 2018-03-01 DIAGNOSIS — I2510 Atherosclerotic heart disease of native coronary artery without angina pectoris: Secondary | ICD-10-CM | POA: Diagnosis not present

## 2018-03-01 DIAGNOSIS — E1151 Type 2 diabetes mellitus with diabetic peripheral angiopathy without gangrene: Secondary | ICD-10-CM | POA: Diagnosis not present

## 2018-03-01 DIAGNOSIS — L97822 Non-pressure chronic ulcer of other part of left lower leg with fat layer exposed: Secondary | ICD-10-CM | POA: Diagnosis not present

## 2018-03-01 DIAGNOSIS — E11622 Type 2 diabetes mellitus with other skin ulcer: Secondary | ICD-10-CM | POA: Diagnosis not present

## 2018-03-01 DIAGNOSIS — Z945 Skin transplant status: Secondary | ICD-10-CM | POA: Diagnosis not present

## 2018-03-01 DIAGNOSIS — I87332 Chronic venous hypertension (idiopathic) with ulcer and inflammation of left lower extremity: Secondary | ICD-10-CM | POA: Diagnosis not present

## 2018-03-01 DIAGNOSIS — L97213 Non-pressure chronic ulcer of right calf with necrosis of muscle: Secondary | ICD-10-CM | POA: Diagnosis not present

## 2018-03-01 DIAGNOSIS — Z48817 Encounter for surgical aftercare following surgery on the skin and subcutaneous tissue: Secondary | ICD-10-CM | POA: Diagnosis not present

## 2018-03-04 ENCOUNTER — Telehealth (INDEPENDENT_AMBULATORY_CARE_PROVIDER_SITE_OTHER): Payer: Self-pay

## 2018-03-04 DIAGNOSIS — I70261 Atherosclerosis of native arteries of extremities with gangrene, right leg: Secondary | ICD-10-CM | POA: Diagnosis not present

## 2018-03-04 DIAGNOSIS — L97213 Non-pressure chronic ulcer of right calf with necrosis of muscle: Secondary | ICD-10-CM | POA: Diagnosis not present

## 2018-03-04 DIAGNOSIS — L97822 Non-pressure chronic ulcer of other part of left lower leg with fat layer exposed: Secondary | ICD-10-CM | POA: Diagnosis not present

## 2018-03-04 DIAGNOSIS — E1151 Type 2 diabetes mellitus with diabetic peripheral angiopathy without gangrene: Secondary | ICD-10-CM | POA: Diagnosis not present

## 2018-03-04 DIAGNOSIS — I87332 Chronic venous hypertension (idiopathic) with ulcer and inflammation of left lower extremity: Secondary | ICD-10-CM | POA: Diagnosis not present

## 2018-03-04 DIAGNOSIS — M8588 Other specified disorders of bone density and structure, other site: Secondary | ICD-10-CM | POA: Diagnosis not present

## 2018-03-04 DIAGNOSIS — I2510 Atherosclerotic heart disease of native coronary artery without angina pectoris: Secondary | ICD-10-CM | POA: Diagnosis not present

## 2018-03-04 DIAGNOSIS — Z48817 Encounter for surgical aftercare following surgery on the skin and subcutaneous tissue: Secondary | ICD-10-CM | POA: Diagnosis not present

## 2018-03-04 DIAGNOSIS — Z945 Skin transplant status: Secondary | ICD-10-CM | POA: Diagnosis not present

## 2018-03-04 DIAGNOSIS — E11622 Type 2 diabetes mellitus with other skin ulcer: Secondary | ICD-10-CM | POA: Diagnosis not present

## 2018-03-04 NOTE — Telephone Encounter (Signed)
Emily Wall with [facility] called stating that compression stockings had irritated patient's left leg and today patient has 8 blisters on front of left leg that are weeping.  Stated that she used abdominal pads and wrapped patient's left leg.  Would like orders for patient to have [employer]?  CB# is [phone].  Please advise.  Thank you.

## 2018-03-04 NOTE — Telephone Encounter (Signed)
Called and lm on vm to ok unna dressing and to have the pt to call if wanting to move appt up from 03/14/18

## 2018-03-06 DIAGNOSIS — E11622 Type 2 diabetes mellitus with other skin ulcer: Secondary | ICD-10-CM | POA: Diagnosis not present

## 2018-03-06 DIAGNOSIS — I87332 Chronic venous hypertension (idiopathic) with ulcer and inflammation of left lower extremity: Secondary | ICD-10-CM | POA: Diagnosis not present

## 2018-03-06 DIAGNOSIS — E1151 Type 2 diabetes mellitus with diabetic peripheral angiopathy without gangrene: Secondary | ICD-10-CM | POA: Diagnosis not present

## 2018-03-06 DIAGNOSIS — Z48817 Encounter for surgical aftercare following surgery on the skin and subcutaneous tissue: Secondary | ICD-10-CM | POA: Diagnosis not present

## 2018-03-06 DIAGNOSIS — Z945 Skin transplant status: Secondary | ICD-10-CM | POA: Diagnosis not present

## 2018-03-06 DIAGNOSIS — I2510 Atherosclerotic heart disease of native coronary artery without angina pectoris: Secondary | ICD-10-CM | POA: Diagnosis not present

## 2018-03-06 DIAGNOSIS — L97213 Non-pressure chronic ulcer of right calf with necrosis of muscle: Secondary | ICD-10-CM | POA: Diagnosis not present

## 2018-03-06 DIAGNOSIS — I70261 Atherosclerosis of native arteries of extremities with gangrene, right leg: Secondary | ICD-10-CM | POA: Diagnosis not present

## 2018-03-06 DIAGNOSIS — L97822 Non-pressure chronic ulcer of other part of left lower leg with fat layer exposed: Secondary | ICD-10-CM | POA: Diagnosis not present

## 2018-03-08 ENCOUNTER — Other Ambulatory Visit: Payer: Self-pay | Admitting: Nurse Practitioner

## 2018-03-08 ENCOUNTER — Telehealth: Payer: Self-pay | Admitting: *Deleted

## 2018-03-08 ENCOUNTER — Telehealth (INDEPENDENT_AMBULATORY_CARE_PROVIDER_SITE_OTHER): Payer: Self-pay | Admitting: Orthopedic Surgery

## 2018-03-08 DIAGNOSIS — L97822 Non-pressure chronic ulcer of other part of left lower leg with fat layer exposed: Secondary | ICD-10-CM | POA: Diagnosis not present

## 2018-03-08 DIAGNOSIS — I2510 Atherosclerotic heart disease of native coronary artery without angina pectoris: Secondary | ICD-10-CM | POA: Diagnosis not present

## 2018-03-08 DIAGNOSIS — R609 Edema, unspecified: Secondary | ICD-10-CM

## 2018-03-08 DIAGNOSIS — Z945 Skin transplant status: Secondary | ICD-10-CM | POA: Diagnosis not present

## 2018-03-08 DIAGNOSIS — Z48817 Encounter for surgical aftercare following surgery on the skin and subcutaneous tissue: Secondary | ICD-10-CM | POA: Diagnosis not present

## 2018-03-08 DIAGNOSIS — I87332 Chronic venous hypertension (idiopathic) with ulcer and inflammation of left lower extremity: Secondary | ICD-10-CM | POA: Diagnosis not present

## 2018-03-08 DIAGNOSIS — E1151 Type 2 diabetes mellitus with diabetic peripheral angiopathy without gangrene: Secondary | ICD-10-CM | POA: Diagnosis not present

## 2018-03-08 DIAGNOSIS — I70261 Atherosclerosis of native arteries of extremities with gangrene, right leg: Secondary | ICD-10-CM | POA: Diagnosis not present

## 2018-03-08 DIAGNOSIS — E11622 Type 2 diabetes mellitus with other skin ulcer: Secondary | ICD-10-CM | POA: Diagnosis not present

## 2018-03-08 DIAGNOSIS — L97213 Non-pressure chronic ulcer of right calf with necrosis of muscle: Secondary | ICD-10-CM | POA: Diagnosis not present

## 2018-03-08 NOTE — Telephone Encounter (Signed)
Deanna -(Nurse) with [facility] called needing verbal orders to use the Zeroform before applying the 4 ply wrap. The number to contact Deanna is [phone]

## 2018-03-08 NOTE — Telephone Encounter (Signed)
Spoke with Deanna:  Ok to use Zeroform before applying the 4-layer wrap.

## 2018-03-08 NOTE — Telephone Encounter (Signed)
FYI VM received from [facility] Record of pts weights, she is not complying with low sodium diet 3/4 164.2 lbs, 3/10 163 lbs, 3/11 166 lbs, 3/12 166.9 lbs, 3/15 169.5 lbs

## 2018-03-09 NOTE — Telephone Encounter (Signed)
Spoke with patient and instructed her to take an extra lasix at 1pm tomorrow and let me know how much she weighs on Monday.

## 2018-03-11 DIAGNOSIS — Z945 Skin transplant status: Secondary | ICD-10-CM | POA: Diagnosis not present

## 2018-03-11 DIAGNOSIS — L97822 Non-pressure chronic ulcer of other part of left lower leg with fat layer exposed: Secondary | ICD-10-CM | POA: Diagnosis not present

## 2018-03-11 DIAGNOSIS — E11622 Type 2 diabetes mellitus with other skin ulcer: Secondary | ICD-10-CM | POA: Diagnosis not present

## 2018-03-11 DIAGNOSIS — E1151 Type 2 diabetes mellitus with diabetic peripheral angiopathy without gangrene: Secondary | ICD-10-CM | POA: Diagnosis not present

## 2018-03-11 DIAGNOSIS — I70261 Atherosclerosis of native arteries of extremities with gangrene, right leg: Secondary | ICD-10-CM | POA: Diagnosis not present

## 2018-03-11 DIAGNOSIS — I87332 Chronic venous hypertension (idiopathic) with ulcer and inflammation of left lower extremity: Secondary | ICD-10-CM | POA: Diagnosis not present

## 2018-03-11 DIAGNOSIS — Z48817 Encounter for surgical aftercare following surgery on the skin and subcutaneous tissue: Secondary | ICD-10-CM | POA: Diagnosis not present

## 2018-03-11 DIAGNOSIS — L97213 Non-pressure chronic ulcer of right calf with necrosis of muscle: Secondary | ICD-10-CM | POA: Diagnosis not present

## 2018-03-11 DIAGNOSIS — I2510 Atherosclerotic heart disease of native coronary artery without angina pectoris: Secondary | ICD-10-CM | POA: Diagnosis not present

## 2018-03-14 ENCOUNTER — Ambulatory Visit (INDEPENDENT_AMBULATORY_CARE_PROVIDER_SITE_OTHER): Payer: Medicare HMO | Admitting: Orthopedic Surgery

## 2018-03-14 ENCOUNTER — Encounter (INDEPENDENT_AMBULATORY_CARE_PROVIDER_SITE_OTHER): Payer: Self-pay | Admitting: Orthopedic Surgery

## 2018-03-14 VITALS — Ht 65.0 in | Wt 169.0 lb

## 2018-03-14 DIAGNOSIS — Z945 Skin transplant status: Secondary | ICD-10-CM

## 2018-03-14 DIAGNOSIS — L97215 Non-pressure chronic ulcer of right calf with muscle involvement without evidence of necrosis: Secondary | ICD-10-CM

## 2018-03-14 DIAGNOSIS — I83012 Varicose veins of right lower extremity with ulcer of calf: Secondary | ICD-10-CM

## 2018-03-14 MED ORDER — OXYCODONE-ACETAMINOPHEN 5-325 MG PO TABS: 1.0000 | ORAL_TABLET | Freq: Two times a day (BID) | ORAL | 0 refills | Status: DC | PRN

## 2018-03-14 NOTE — Progress Notes (Signed)
Office Visit Note   Patient: Emily Wall           Date of Birth: 22-Nov-1936           MRN: [id number] Visit Date: 03/14/2018              Requested by: Chevis Pretty, [address] [place], [place] [address] PCP: Chevis Pretty, FNP  Chief Complaint  Patient presents with  . Right Leg - Follow-up    11/09/17 right achilles debridement and skin graft.       HPI: Patient is a 82 year old woman who presents follow-up for split-thickness skin graft venous ulceration right lower extremity as well as venous ulcers to the left lower extremity.  Patient did not feel comfortable wearing the compression stockings.  Assessment & Plan: Visit Diagnoses:  1. Venous stasis ulcer of right calf with muscle involvement without evidence of necrosis with varicose veins ([place])   2. S/P split thickness skin graft     Plan: We will apply Dynaflex to both legs.  Follow-up in 1 week.  If we continue to show progressive improvement will encourage the use of the compression stockings she has these with her today.  Follow-Up Instructions: Return in about 1 week (around 03/21/2018).   Ortho Exam  Patient is alert, oriented, no adenopathy, well-dressed, normal affect, normal respiratory effort. Examination patient has very small 5 mm venous blisters in the left lower extremity these are stable there is no cellulitis no drainage no odor.  Examination the right leg patient continues to show rapid epithelialization around the wound edges with good granulation tissue at the base of the wound.  There is no cellulitis no drainage no odor no signs of infection.  Imaging: No results found. No images are attached to the encounter.  Labs: Lab Results  Component Value Date   HGBA1C 8.1 (H) 11/03/2017   HGBA1C 6.7 02/15/2016   HGBA1C 7.2 11/15/2015   ESRSEDRATE 26 (H) 11/03/2017   CRP 1.8 (H) 11/03/2017   REPTSTATUS 11/08/2017 FINAL 11/03/2017   CULT NO GROWTH 5 DAYS 11/03/2017     @LABSALLVALUES (HGBA1)@  Body mass index is 28.12 kg/m.  Orders:  No orders of the defined types were placed in this encounter.  No orders of the defined types were placed in this encounter.    Procedures: No procedures performed  Clinical Data: No additional findings.  ROS:  All other systems negative, except as noted in the HPI. Review of Systems  Objective: Vital Signs: Ht 5\' 5"  (1.651 m)   Wt 169 lb (76.7 kg)   BMI 28.12 kg/m   Specialty Comments:  No specialty comments available.  PMFS History: Patient Active Problem List   Diagnosis Date Noted  . S/P split thickness skin graft 11/29/2017  . Diabetic leg ulcer ([place])   . Atherosclerosis of native arteries of extremities with gangrene, right leg ([place])   . Wound infection 11/03/2017  . Hypoalbuminemia 11/03/2017  . Venous stasis ulcer ([place]) 11/03/2017  . BMI 26.0-26.9,adult 11/15/2015  . Family history of malignant neoplasm of gastrointestinal tract 06/30/2013  . Hyperlipidemia 06/02/2013  . Diabetes mellitus ([place]) 03/29/2011  . Osteoporosis 03/29/2011  . Iron deficiency anemia 12/29/2010  . Essential hypertension 09/08/2009  . Aortic valve disorder 09/08/2009  . Secondary cardiomyopathy ([place]) 09/08/2009   Past Medical History:  Diagnosis Date  . Aortic regurgitation    Mild  . Aortic stenosis    Moderate  . Coronary atherosclerosis of native coronary artery  Nonobstructive 2007  . Essential hypertension, benign   . Hyperlipidemia   . Hypothyroidism   . Nonischemic cardiomyopathy (HCC)    LVEF improved to 50-55%  . Osteoporosis   . Type 2 diabetes mellitus (HCC)     Family History  Problem Relation Age of Onset  . Colon cancer Mother   . Colon cancer Brother   . Diabetes Brother   . Heart disease Brother   . Diabetes Brother   . Cancer Brother        prostate  . Prostate cancer Brother   . Cancer Brother   . Cancer Sister        stomach  . Heart failure Daughter   . Thyroid  disease Daughter   . Cancer Sister        lung  . Heart disease Sister     Past Surgical History:  Procedure Laterality Date  . LOWER EXTREMITY ANGIOGRAPHY N/A 11/13/2017   Procedure: LOWER EXTREMITY ANGIOGRAPHY;  Surgeon: Serafina Mitchell, MD;  Location: [facility];  Service: Cardiovascular;  Laterality: N/A;  . SKIN GRAFT    . SKIN SPLIT GRAFT Right 11/09/2017   Procedure: RIGHT LEG DEBRIDE ACHILLES, SPLIT THICKNESS SKIN GRAFT, VAC;  Surgeon: Newt Minion, MD;  Location: [facility];  Service: Orthopedics;  Laterality: Right;   Social History   Occupational History    Employer: RETIRED    CommentYouth worker  Tobacco Use  . Smoking status: Former Smoker    Packs/day: 0.50    Years: 30.00    Pack years: 15.00    Types: Cigarettes    Last attempt to quit: 12/25/1988    Years since quitting: 29.2  . Smokeless tobacco: Never Used  Substance and Sexual Activity  . Alcohol use: No    Alcohol/week: 0.0 oz  . Drug use: No  . Sexual activity: Never

## 2018-03-14 NOTE — Addendum Note (Signed)
Addended by: Meridee Score on: 03/14/2018 10:47 AM   Modules accepted: Orders

## 2018-03-16 DIAGNOSIS — E1151 Type 2 diabetes mellitus with diabetic peripheral angiopathy without gangrene: Secondary | ICD-10-CM | POA: Diagnosis not present

## 2018-03-16 DIAGNOSIS — Z48817 Encounter for surgical aftercare following surgery on the skin and subcutaneous tissue: Secondary | ICD-10-CM | POA: Diagnosis not present

## 2018-03-16 DIAGNOSIS — L97821 Non-pressure chronic ulcer of other part of left lower leg limited to breakdown of skin: Secondary | ICD-10-CM | POA: Diagnosis not present

## 2018-03-16 DIAGNOSIS — Z945 Skin transplant status: Secondary | ICD-10-CM | POA: Diagnosis not present

## 2018-03-16 DIAGNOSIS — I2510 Atherosclerotic heart disease of native coronary artery without angina pectoris: Secondary | ICD-10-CM | POA: Diagnosis not present

## 2018-03-16 DIAGNOSIS — I70232 Atherosclerosis of native arteries of right leg with ulceration of calf: Secondary | ICD-10-CM | POA: Diagnosis not present

## 2018-03-16 DIAGNOSIS — I87332 Chronic venous hypertension (idiopathic) with ulcer and inflammation of left lower extremity: Secondary | ICD-10-CM | POA: Diagnosis not present

## 2018-03-16 DIAGNOSIS — L97212 Non-pressure chronic ulcer of right calf with fat layer exposed: Secondary | ICD-10-CM | POA: Diagnosis not present

## 2018-03-16 DIAGNOSIS — I110 Hypertensive heart disease with heart failure: Secondary | ICD-10-CM | POA: Diagnosis not present

## 2018-03-18 DIAGNOSIS — I70232 Atherosclerosis of native arteries of right leg with ulceration of calf: Secondary | ICD-10-CM | POA: Diagnosis not present

## 2018-03-18 DIAGNOSIS — E1151 Type 2 diabetes mellitus with diabetic peripheral angiopathy without gangrene: Secondary | ICD-10-CM | POA: Diagnosis not present

## 2018-03-18 DIAGNOSIS — Z945 Skin transplant status: Secondary | ICD-10-CM | POA: Diagnosis not present

## 2018-03-18 DIAGNOSIS — I2510 Atherosclerotic heart disease of native coronary artery without angina pectoris: Secondary | ICD-10-CM | POA: Diagnosis not present

## 2018-03-18 DIAGNOSIS — L97821 Non-pressure chronic ulcer of other part of left lower leg limited to breakdown of skin: Secondary | ICD-10-CM | POA: Diagnosis not present

## 2018-03-18 DIAGNOSIS — Z48817 Encounter for surgical aftercare following surgery on the skin and subcutaneous tissue: Secondary | ICD-10-CM | POA: Diagnosis not present

## 2018-03-18 DIAGNOSIS — L97212 Non-pressure chronic ulcer of right calf with fat layer exposed: Secondary | ICD-10-CM | POA: Diagnosis not present

## 2018-03-18 DIAGNOSIS — I110 Hypertensive heart disease with heart failure: Secondary | ICD-10-CM | POA: Diagnosis not present

## 2018-03-18 DIAGNOSIS — I87332 Chronic venous hypertension (idiopathic) with ulcer and inflammation of left lower extremity: Secondary | ICD-10-CM | POA: Diagnosis not present

## 2018-03-19 ENCOUNTER — Encounter: Payer: Self-pay | Admitting: *Deleted

## 2018-03-19 ENCOUNTER — Encounter: Payer: Self-pay | Admitting: Nurse Practitioner

## 2018-03-19 ENCOUNTER — Ambulatory Visit (INDEPENDENT_AMBULATORY_CARE_PROVIDER_SITE_OTHER): Payer: Medicare HMO | Admitting: Nurse Practitioner

## 2018-03-19 VITALS — BP 134/84 | HR 84 | Temp 98.2°F | Ht 65.0 in | Wt 172.0 lb

## 2018-03-19 DIAGNOSIS — I83012 Varicose veins of right lower extremity with ulcer of calf: Secondary | ICD-10-CM | POA: Diagnosis not present

## 2018-03-19 DIAGNOSIS — R609 Edema, unspecified: Secondary | ICD-10-CM

## 2018-03-19 DIAGNOSIS — L97215 Non-pressure chronic ulcer of right calf with muscle involvement without evidence of necrosis: Secondary | ICD-10-CM | POA: Diagnosis not present

## 2018-03-19 NOTE — Progress Notes (Addendum)
   Subjective:    Patient ID: Emily Wall, female    DOB: 1936/12/24, 82 y.o.   MRN: [id number]  HPI Patient brought in by her daughter today for recheck of hr lower ext edema. Home health called about a week ago with weight gain. She was instructed to increase lasix to BID- has helped a lot with swelling. She is now taking lasix 40mg  in morning and 20mg  in afternoon. Weight was 166 this morning at home. She denies any SOB or chest pain.  * still seeing wound care for lower ext lesions- they say theyare improving- she has wrap on them so unable to see today  Review of Systems  Constitutional: Negative for activity change and appetite change.  HENT: Negative.   Eyes: Negative for pain.  Respiratory: Negative for shortness of breath.   Cardiovascular: Positive for leg swelling. Negative for chest pain and palpitations.  Gastrointestinal: Negative for abdominal pain.  Endocrine: Negative for polydipsia.  Genitourinary: Negative.   Skin: Negative for rash.  Neurological: Negative for dizziness, weakness and headaches.  Hematological: Does not bruise/bleed easily.  Psychiatric/Behavioral: Negative.   All other systems reviewed and are negative.      Objective:   Physical Exam  Constitutional: She is oriented to person, place, and time. She appears well-developed and well-nourished. No distress.  Cardiovascular: Normal rate.  Murmur (3/6 systolic murmur) heard. Pulmonary/Chest: Effort normal and breath sounds normal.  Neurological: She is alert and oriented to person, place, and time.  Skin: Skin is warm.  Psychiatric: She has a normal mood and affect. Her behavior is normal. Judgment and thought content normal.   BP 134/84   Pulse 84   Temp 98.2 F (36.8 C) (Oral)   Ht 5\' 5"  (1.651 m)   Wt 172 lb (78 kg)   BMI 28.62 kg/m      Assessment & Plan:  1. Venous stasis ulcer of right calf with muscle involvement without evidence of necrosis with varicose veins ([place]) Continue  with wound care  2. Peripheral edema continue lasix 40 mg in morning and 20mg  in afternoon Elevate legs when siting Follow up in 2 months for routine followup  [place], FNP

## 2018-03-19 NOTE — Patient Instructions (Signed)
Edema Edema is when you have too much fluid in your body or under your skin. Edema may make your legs, feet, and ankles swell up. Swelling is also common in looser tissues, like around your eyes. This is a common condition. It gets more common as you get older. There are many possible causes of edema. Eating too much salt (sodium) and being on your feet or sitting for a long time can cause edema in your legs, feet, and ankles. Hot weather may make edema worse. Edema is usually painless. Your skin may look swollen or shiny. Follow these instructions at home:  Keep the swollen body part raised (elevated) above the level of your heart when you are sitting or lying down.  Do not sit still or stand for a long time.  Do not wear tight clothes. Do not wear garters on your upper legs.  Exercise your legs. This can help the swelling go down.  Wear elastic bandages or support stockings as told by your doctor.  Eat a low-salt (low-sodium) diet to reduce fluid as told by your doctor.  Depending on the cause of your swelling, you may need to limit how much fluid you drink (fluid restriction).  Take over-the-counter and prescription medicines only as told by your doctor. Contact a doctor if:  Treatment is not working.  You have heart, liver, or kidney disease and have symptoms of edema.  You have sudden and unexplained weight gain. Get help right away if:  You have shortness of breath or chest pain.  You cannot breathe when you lie down.  You have pain, redness, or warmth in the swollen areas.  You have heart, liver, or kidney disease and get edema all of a sudden.  You have a fever and your symptoms get worse all of a sudden. Summary  Edema is when you have too much fluid in your body or under your skin.  Edema may make your legs, feet, and ankles swell up. Swelling is also common in looser tissues, like around your eyes.  Raise (elevate) the swollen body part above the level of your  heart when you are sitting or lying down.  Follow your doctor's instructions about diet and how much fluid you can drink (fluid restriction). This information is not intended to replace advice given to you by your health care provider. Make sure you discuss any questions you have with your health care provider. Document Released: 05/29/2008 Document Revised: 12/29/2016 Document Reviewed: 12/29/2016 Elsevier Interactive Patient Education  2017 Elsevier Inc.  

## 2018-03-20 ENCOUNTER — Ambulatory Visit (INDEPENDENT_AMBULATORY_CARE_PROVIDER_SITE_OTHER): Payer: Medicare HMO

## 2018-03-20 DIAGNOSIS — L97821 Non-pressure chronic ulcer of other part of left lower leg limited to breakdown of skin: Secondary | ICD-10-CM | POA: Diagnosis not present

## 2018-03-20 DIAGNOSIS — I2510 Atherosclerotic heart disease of native coronary artery without angina pectoris: Secondary | ICD-10-CM | POA: Diagnosis not present

## 2018-03-20 DIAGNOSIS — I70232 Atherosclerosis of native arteries of right leg with ulceration of calf: Secondary | ICD-10-CM | POA: Diagnosis not present

## 2018-03-20 DIAGNOSIS — I87332 Chronic venous hypertension (idiopathic) with ulcer and inflammation of left lower extremity: Secondary | ICD-10-CM | POA: Diagnosis not present

## 2018-03-20 DIAGNOSIS — Z48817 Encounter for surgical aftercare following surgery on the skin and subcutaneous tissue: Secondary | ICD-10-CM | POA: Diagnosis not present

## 2018-03-20 DIAGNOSIS — E1151 Type 2 diabetes mellitus with diabetic peripheral angiopathy without gangrene: Secondary | ICD-10-CM | POA: Diagnosis not present

## 2018-03-20 DIAGNOSIS — Z945 Skin transplant status: Secondary | ICD-10-CM | POA: Diagnosis not present

## 2018-03-20 DIAGNOSIS — I110 Hypertensive heart disease with heart failure: Secondary | ICD-10-CM | POA: Diagnosis not present

## 2018-03-20 DIAGNOSIS — L97212 Non-pressure chronic ulcer of right calf with fat layer exposed: Secondary | ICD-10-CM | POA: Diagnosis not present

## 2018-03-21 ENCOUNTER — Encounter (INDEPENDENT_AMBULATORY_CARE_PROVIDER_SITE_OTHER): Payer: Self-pay | Admitting: Orthopedic Surgery

## 2018-03-21 ENCOUNTER — Ambulatory Visit (INDEPENDENT_AMBULATORY_CARE_PROVIDER_SITE_OTHER): Payer: Medicare HMO | Admitting: Orthopedic Surgery

## 2018-03-21 VITALS — Ht 65.0 in | Wt 172.0 lb

## 2018-03-21 DIAGNOSIS — Z48817 Encounter for surgical aftercare following surgery on the skin and subcutaneous tissue: Secondary | ICD-10-CM | POA: Diagnosis not present

## 2018-03-21 DIAGNOSIS — I83012 Varicose veins of right lower extremity with ulcer of calf: Secondary | ICD-10-CM

## 2018-03-21 DIAGNOSIS — Z945 Skin transplant status: Secondary | ICD-10-CM

## 2018-03-21 DIAGNOSIS — L97821 Non-pressure chronic ulcer of other part of left lower leg limited to breakdown of skin: Secondary | ICD-10-CM | POA: Diagnosis not present

## 2018-03-21 DIAGNOSIS — I2510 Atherosclerotic heart disease of native coronary artery without angina pectoris: Secondary | ICD-10-CM | POA: Diagnosis not present

## 2018-03-21 DIAGNOSIS — I70232 Atherosclerosis of native arteries of right leg with ulceration of calf: Secondary | ICD-10-CM | POA: Diagnosis not present

## 2018-03-21 DIAGNOSIS — E1151 Type 2 diabetes mellitus with diabetic peripheral angiopathy without gangrene: Secondary | ICD-10-CM | POA: Diagnosis not present

## 2018-03-21 DIAGNOSIS — L97212 Non-pressure chronic ulcer of right calf with fat layer exposed: Secondary | ICD-10-CM | POA: Diagnosis not present

## 2018-03-21 DIAGNOSIS — I87332 Chronic venous hypertension (idiopathic) with ulcer and inflammation of left lower extremity: Secondary | ICD-10-CM | POA: Diagnosis not present

## 2018-03-21 DIAGNOSIS — I110 Hypertensive heart disease with heart failure: Secondary | ICD-10-CM | POA: Diagnosis not present

## 2018-03-21 DIAGNOSIS — L97215 Non-pressure chronic ulcer of right calf with muscle involvement without evidence of necrosis: Secondary | ICD-10-CM

## 2018-03-21 NOTE — Progress Notes (Signed)
Office Visit Note   Patient: Emily Wall           Date of Birth: 28-Mar-1936           MRN: [id number] Visit Date: 03/21/2018              Requested by: Chevis Pretty, [address] [place], [place] [address] PCP: Chevis Pretty, FNP  Chief Complaint  Patient presents with  . Right Leg - Follow-up    Compression wraps to bilateral legs s/p achilles debridement on the right 10/2017  . Left Leg - Follow-up      HPI: Patient presents in follow-up for venous ulcers both legs status post skin grafting and wound debridement for ulceration to the right leg.  Patient has been a Dynaflex compression wraps.  Assessment & Plan: Visit Diagnoses:  1. Venous stasis ulcer of right calf with muscle involvement without evidence of necrosis with varicose veins ([place])   2. S/P split thickness skin graft     Plan: Patient will transition to the medical compression stocking.  She will wear this around the clock change it daily for washing with soap and water apply a new dry sock.  Elevate her legs when she is not ambulating.  Follow-up for evaluation in 2 weeks.  Follow-Up Instructions: Return in about 2 weeks (around 04/04/2018).   Ortho Exam  Patient is alert, oriented, no adenopathy, well-dressed, normal affect, normal respiratory effort. Examination the swelling has resolved nicely in the left leg there is good wrinkling of the skin there is no ulcerations or skin breakdown.  Examination the right leg there is increased swelling but there is good granulation tissue in the wound beds no exposed bone tendon or muscle.  No drainage no cellulitis no signs of infection.  Imaging: No results found. No images are attached to the encounter.  Labs: Lab Results  Component Value Date   HGBA1C 8.1 (H) 11/03/2017   HGBA1C 6.7 02/15/2016   HGBA1C 7.2 11/15/2015   ESRSEDRATE 26 (H) 11/03/2017   CRP 1.8 (H) 11/03/2017   REPTSTATUS 11/08/2017 FINAL 11/03/2017   CULT NO  GROWTH 5 DAYS 11/03/2017    @LABSALLVALUES (HGBA1)@  Body mass index is 28.62 kg/m.  Orders:  No orders of the defined types were placed in this encounter.  No orders of the defined types were placed in this encounter.    Procedures: No procedures performed  Clinical Data: No additional findings.  ROS:  All other systems negative, except as noted in the HPI. Review of Systems  Objective: Vital Signs: Ht 5\' 5"  (1.651 m)   Wt 172 lb (78 kg)   BMI 28.62 kg/m   Specialty Comments:  No specialty comments available.  PMFS History: Patient Active Problem List   Diagnosis Date Noted  . S/P split thickness skin graft 11/29/2017  . Diabetic leg ulcer ([place])   . Atherosclerosis of native arteries of extremities with gangrene, right leg ([place])   . Wound infection 11/03/2017  . Hypoalbuminemia 11/03/2017  . Venous stasis ulcer ([place]) 11/03/2017  . BMI 26.0-26.9,adult 11/15/2015  . Family history of malignant neoplasm of gastrointestinal tract 06/30/2013  . Hyperlipidemia 06/02/2013  . Diabetes mellitus ([place]) 03/29/2011  . Osteoporosis 03/29/2011  . Iron deficiency anemia 12/29/2010  . Essential hypertension 09/08/2009  . Aortic valve disorder 09/08/2009  . Secondary cardiomyopathy ([place]) 09/08/2009   Past Medical History:  Diagnosis Date  . Aortic regurgitation    Mild  . Aortic stenosis    Moderate  .  Coronary atherosclerosis of native coronary artery    Nonobstructive 2007  . Essential hypertension, benign   . Hyperlipidemia   . Hypothyroidism   . Nonischemic cardiomyopathy (HCC)    LVEF improved to 50-55%  . Osteoporosis   . Type 2 diabetes mellitus (HCC)     Family History  Problem Relation Age of Onset  . Colon cancer Mother   . Colon cancer Brother   . Diabetes Brother   . Heart disease Brother   . Diabetes Brother   . Cancer Brother        prostate  . Prostate cancer Brother   . Cancer Brother   . Cancer Sister        stomach  . Heart failure  Daughter   . Thyroid disease Daughter   . Cancer Sister        lung  . Heart disease Sister     Past Surgical History:  Procedure Laterality Date  . LOWER EXTREMITY ANGIOGRAPHY N/A 11/13/2017   Procedure: LOWER EXTREMITY ANGIOGRAPHY;  Surgeon: Serafina Mitchell, MD;  Location: [facility];  Service: Cardiovascular;  Laterality: N/A;  . SKIN GRAFT    . SKIN SPLIT GRAFT Right 11/09/2017   Procedure: RIGHT LEG DEBRIDE ACHILLES, SPLIT THICKNESS SKIN GRAFT, VAC;  Surgeon: Newt Minion, MD;  Location: [facility];  Service: Orthopedics;  Laterality: Right;   Social History   Occupational History    Employer: RETIRED    CommentYouth worker  Tobacco Use  . Smoking status: Former Smoker    Packs/day: 0.50    Years: 30.00    Pack years: 15.00    Types: Cigarettes    Last attempt to quit: 12/25/1988    Years since quitting: 29.2  . Smokeless tobacco: Never Used  Substance and Sexual Activity  . Alcohol use: No    Alcohol/week: 0.0 oz  . Drug use: No  . Sexual activity: Never

## 2018-03-22 ENCOUNTER — Ambulatory Visit (INDEPENDENT_AMBULATORY_CARE_PROVIDER_SITE_OTHER): Payer: Medicare HMO

## 2018-03-22 ENCOUNTER — Telehealth (INDEPENDENT_AMBULATORY_CARE_PROVIDER_SITE_OTHER): Payer: Self-pay | Admitting: Orthopedic Surgery

## 2018-03-22 DIAGNOSIS — E1151 Type 2 diabetes mellitus with diabetic peripheral angiopathy without gangrene: Secondary | ICD-10-CM | POA: Diagnosis not present

## 2018-03-22 DIAGNOSIS — I70232 Atherosclerosis of native arteries of right leg with ulceration of calf: Secondary | ICD-10-CM | POA: Diagnosis not present

## 2018-03-22 DIAGNOSIS — L97821 Non-pressure chronic ulcer of other part of left lower leg limited to breakdown of skin: Secondary | ICD-10-CM | POA: Diagnosis not present

## 2018-03-22 DIAGNOSIS — I110 Hypertensive heart disease with heart failure: Secondary | ICD-10-CM | POA: Diagnosis not present

## 2018-03-22 DIAGNOSIS — Z945 Skin transplant status: Secondary | ICD-10-CM | POA: Diagnosis not present

## 2018-03-22 DIAGNOSIS — I2510 Atherosclerotic heart disease of native coronary artery without angina pectoris: Secondary | ICD-10-CM | POA: Diagnosis not present

## 2018-03-22 DIAGNOSIS — Z48817 Encounter for surgical aftercare following surgery on the skin and subcutaneous tissue: Secondary | ICD-10-CM | POA: Diagnosis not present

## 2018-03-22 DIAGNOSIS — I87332 Chronic venous hypertension (idiopathic) with ulcer and inflammation of left lower extremity: Secondary | ICD-10-CM | POA: Diagnosis not present

## 2018-03-22 DIAGNOSIS — L97212 Non-pressure chronic ulcer of right calf with fat layer exposed: Secondary | ICD-10-CM | POA: Diagnosis not present

## 2018-03-22 NOTE — Telephone Encounter (Signed)
I called and lm on vm to advise that the Emily Wall was evaluated in the office and was advised to stop her dressing changes and could apply her medical grade compression sock and to wear this with direct contact on the leg and if there is any drainage that comes through on the sock she can apply kerlix to the outside of the sock to absorb this. Wash and change daily or as needed and she has follow up in the office with Dr. Sharol Given in 4 weeks sooner if she has any changes. To call with questions.

## 2018-03-22 NOTE — Telephone Encounter (Signed)
Emily Wall needs orders to verify that she is supposed to stop with the dry dressing changes and just use the medicated compression stockings. Please advise # [phone]

## 2018-03-25 DIAGNOSIS — I110 Hypertensive heart disease with heart failure: Secondary | ICD-10-CM | POA: Diagnosis not present

## 2018-03-25 DIAGNOSIS — L97212 Non-pressure chronic ulcer of right calf with fat layer exposed: Secondary | ICD-10-CM | POA: Diagnosis not present

## 2018-03-25 DIAGNOSIS — I70232 Atherosclerosis of native arteries of right leg with ulceration of calf: Secondary | ICD-10-CM | POA: Diagnosis not present

## 2018-03-25 DIAGNOSIS — I87332 Chronic venous hypertension (idiopathic) with ulcer and inflammation of left lower extremity: Secondary | ICD-10-CM | POA: Diagnosis not present

## 2018-03-25 DIAGNOSIS — L97821 Non-pressure chronic ulcer of other part of left lower leg limited to breakdown of skin: Secondary | ICD-10-CM | POA: Diagnosis not present

## 2018-03-25 DIAGNOSIS — E1151 Type 2 diabetes mellitus with diabetic peripheral angiopathy without gangrene: Secondary | ICD-10-CM | POA: Diagnosis not present

## 2018-03-25 DIAGNOSIS — I2510 Atherosclerotic heart disease of native coronary artery without angina pectoris: Secondary | ICD-10-CM | POA: Diagnosis not present

## 2018-03-25 DIAGNOSIS — Z945 Skin transplant status: Secondary | ICD-10-CM | POA: Diagnosis not present

## 2018-03-25 DIAGNOSIS — Z48817 Encounter for surgical aftercare following surgery on the skin and subcutaneous tissue: Secondary | ICD-10-CM | POA: Diagnosis not present

## 2018-03-27 DIAGNOSIS — I110 Hypertensive heart disease with heart failure: Secondary | ICD-10-CM | POA: Diagnosis not present

## 2018-03-27 DIAGNOSIS — I70232 Atherosclerosis of native arteries of right leg with ulceration of calf: Secondary | ICD-10-CM | POA: Diagnosis not present

## 2018-03-27 DIAGNOSIS — L97821 Non-pressure chronic ulcer of other part of left lower leg limited to breakdown of skin: Secondary | ICD-10-CM | POA: Diagnosis not present

## 2018-03-27 DIAGNOSIS — I87332 Chronic venous hypertension (idiopathic) with ulcer and inflammation of left lower extremity: Secondary | ICD-10-CM | POA: Diagnosis not present

## 2018-03-27 DIAGNOSIS — Z945 Skin transplant status: Secondary | ICD-10-CM | POA: Diagnosis not present

## 2018-03-27 DIAGNOSIS — Z48817 Encounter for surgical aftercare following surgery on the skin and subcutaneous tissue: Secondary | ICD-10-CM | POA: Diagnosis not present

## 2018-03-27 DIAGNOSIS — L97212 Non-pressure chronic ulcer of right calf with fat layer exposed: Secondary | ICD-10-CM | POA: Diagnosis not present

## 2018-03-27 DIAGNOSIS — E1151 Type 2 diabetes mellitus with diabetic peripheral angiopathy without gangrene: Secondary | ICD-10-CM | POA: Diagnosis not present

## 2018-03-27 DIAGNOSIS — I2510 Atherosclerotic heart disease of native coronary artery without angina pectoris: Secondary | ICD-10-CM | POA: Diagnosis not present

## 2018-03-29 DIAGNOSIS — E1151 Type 2 diabetes mellitus with diabetic peripheral angiopathy without gangrene: Secondary | ICD-10-CM | POA: Diagnosis not present

## 2018-03-29 DIAGNOSIS — Z945 Skin transplant status: Secondary | ICD-10-CM | POA: Diagnosis not present

## 2018-03-29 DIAGNOSIS — L97212 Non-pressure chronic ulcer of right calf with fat layer exposed: Secondary | ICD-10-CM | POA: Diagnosis not present

## 2018-03-29 DIAGNOSIS — I2510 Atherosclerotic heart disease of native coronary artery without angina pectoris: Secondary | ICD-10-CM | POA: Diagnosis not present

## 2018-03-29 DIAGNOSIS — L97821 Non-pressure chronic ulcer of other part of left lower leg limited to breakdown of skin: Secondary | ICD-10-CM | POA: Diagnosis not present

## 2018-03-29 DIAGNOSIS — I110 Hypertensive heart disease with heart failure: Secondary | ICD-10-CM | POA: Diagnosis not present

## 2018-03-29 DIAGNOSIS — I70232 Atherosclerosis of native arteries of right leg with ulceration of calf: Secondary | ICD-10-CM | POA: Diagnosis not present

## 2018-03-29 DIAGNOSIS — I87332 Chronic venous hypertension (idiopathic) with ulcer and inflammation of left lower extremity: Secondary | ICD-10-CM | POA: Diagnosis not present

## 2018-03-29 DIAGNOSIS — Z48817 Encounter for surgical aftercare following surgery on the skin and subcutaneous tissue: Secondary | ICD-10-CM | POA: Diagnosis not present

## 2018-04-01 ENCOUNTER — Other Ambulatory Visit: Payer: Self-pay | Admitting: Nurse Practitioner

## 2018-04-01 DIAGNOSIS — L97212 Non-pressure chronic ulcer of right calf with fat layer exposed: Secondary | ICD-10-CM | POA: Diagnosis not present

## 2018-04-01 DIAGNOSIS — Z945 Skin transplant status: Secondary | ICD-10-CM | POA: Diagnosis not present

## 2018-04-01 DIAGNOSIS — I2510 Atherosclerotic heart disease of native coronary artery without angina pectoris: Secondary | ICD-10-CM | POA: Diagnosis not present

## 2018-04-01 DIAGNOSIS — I110 Hypertensive heart disease with heart failure: Secondary | ICD-10-CM | POA: Diagnosis not present

## 2018-04-01 DIAGNOSIS — E119 Type 2 diabetes mellitus without complications: Secondary | ICD-10-CM

## 2018-04-01 DIAGNOSIS — I70232 Atherosclerosis of native arteries of right leg with ulceration of calf: Secondary | ICD-10-CM | POA: Diagnosis not present

## 2018-04-01 DIAGNOSIS — L97821 Non-pressure chronic ulcer of other part of left lower leg limited to breakdown of skin: Secondary | ICD-10-CM | POA: Diagnosis not present

## 2018-04-01 DIAGNOSIS — I87332 Chronic venous hypertension (idiopathic) with ulcer and inflammation of left lower extremity: Secondary | ICD-10-CM | POA: Diagnosis not present

## 2018-04-01 DIAGNOSIS — E1151 Type 2 diabetes mellitus with diabetic peripheral angiopathy without gangrene: Secondary | ICD-10-CM | POA: Diagnosis not present

## 2018-04-01 DIAGNOSIS — Z48817 Encounter for surgical aftercare following surgery on the skin and subcutaneous tissue: Secondary | ICD-10-CM | POA: Diagnosis not present

## 2018-04-03 ENCOUNTER — Telehealth: Payer: Self-pay | Admitting: *Deleted

## 2018-04-03 DIAGNOSIS — E1151 Type 2 diabetes mellitus with diabetic peripheral angiopathy without gangrene: Secondary | ICD-10-CM | POA: Diagnosis not present

## 2018-04-03 DIAGNOSIS — L97821 Non-pressure chronic ulcer of other part of left lower leg limited to breakdown of skin: Secondary | ICD-10-CM | POA: Diagnosis not present

## 2018-04-03 DIAGNOSIS — I87332 Chronic venous hypertension (idiopathic) with ulcer and inflammation of left lower extremity: Secondary | ICD-10-CM | POA: Diagnosis not present

## 2018-04-03 DIAGNOSIS — L97212 Non-pressure chronic ulcer of right calf with fat layer exposed: Secondary | ICD-10-CM | POA: Diagnosis not present

## 2018-04-03 DIAGNOSIS — I70232 Atherosclerosis of native arteries of right leg with ulceration of calf: Secondary | ICD-10-CM | POA: Diagnosis not present

## 2018-04-03 DIAGNOSIS — Z48817 Encounter for surgical aftercare following surgery on the skin and subcutaneous tissue: Secondary | ICD-10-CM | POA: Diagnosis not present

## 2018-04-03 DIAGNOSIS — I2510 Atherosclerotic heart disease of native coronary artery without angina pectoris: Secondary | ICD-10-CM | POA: Diagnosis not present

## 2018-04-03 DIAGNOSIS — I110 Hypertensive heart disease with heart failure: Secondary | ICD-10-CM | POA: Diagnosis not present

## 2018-04-03 DIAGNOSIS — Z945 Skin transplant status: Secondary | ICD-10-CM | POA: Diagnosis not present

## 2018-04-03 MED ORDER — ACCU-CHEK SOFTCLIX LANCETS MISC: 3 refills | Status: AC

## 2018-04-03 MED ORDER — GLUCOSE BLOOD VI STRP: ORAL_STRIP | 3 refills | Status: AC

## 2018-04-03 MED ORDER — ACCU-CHEK AVIVA PLUS W/DEVICE KIT: PACK | 0 refills | Status: AC

## 2018-04-03 NOTE — Telephone Encounter (Signed)
Called [employer] to find out which blood sugar machine they paid for Sent accu-chek avivia into [facility] mail order pharmacy

## 2018-04-04 ENCOUNTER — Ambulatory Visit (INDEPENDENT_AMBULATORY_CARE_PROVIDER_SITE_OTHER): Payer: Medicare HMO | Admitting: Orthopedic Surgery

## 2018-04-04 ENCOUNTER — Encounter (INDEPENDENT_AMBULATORY_CARE_PROVIDER_SITE_OTHER): Payer: Self-pay | Admitting: Orthopedic Surgery

## 2018-04-04 VITALS — Ht 65.0 in | Wt 172.0 lb

## 2018-04-04 DIAGNOSIS — L97215 Non-pressure chronic ulcer of right calf with muscle involvement without evidence of necrosis: Secondary | ICD-10-CM | POA: Diagnosis not present

## 2018-04-04 DIAGNOSIS — I83012 Varicose veins of right lower extremity with ulcer of calf: Secondary | ICD-10-CM

## 2018-04-04 MED ORDER — OXYCODONE-ACETAMINOPHEN 5-325 MG PO TABS: 1.0000 | ORAL_TABLET | Freq: Two times a day (BID) | ORAL | 0 refills | Status: AC | PRN

## 2018-04-04 NOTE — Progress Notes (Signed)
Office Visit Note   Patient: Emily Wall           Date of Birth: 01-13-1936           MRN: [id number] Visit Date: 04/04/2018              Requested by: Chevis Pretty, [address] [place], [place] [address] PCP: Chevis Pretty, FNP  Chief Complaint  Patient presents with  . Right Leg - Follow-up    10/2017 right achilles debridement       HPI: Patient is a 82 year old woman who presents in follow-up for debridement and skin grafting for right Achilles tendon wound and ulceration.  She is currently having home health nursing apply Dynaflex wrap twice a week.  Patient is quite pleased with her progress and feels that it looks much better.  Assessment & Plan: Visit Diagnoses:  1. Venous stasis ulcer of right calf with muscle involvement without evidence of necrosis with varicose veins ([place])     Plan: We will apply Dynaflex wrap today continue with home health nursing dressing changes twice a week follow-up in 4 weeks.  Follow-Up Instructions: Return in about 1 month (around 05/02/2018).   Ortho Exam  Patient is alert, oriented, no adenopathy, well-dressed, normal affect, normal respiratory effort. Examination patient is ambulating in a wheelchair.  The wound continues to improve the ulcer is flat there is no cellulitis no drainage no odor no exposed tendon there is good granulation tissue over the wound VAC.  There is a small amount of fibrinous exudative tissue.  The wound area is approximately 3 x 10 cm.  Imaging: No results found. No images are attached to the encounter.  Labs: Lab Results  Component Value Date   HGBA1C 8.1 (H) 11/03/2017   HGBA1C 6.7 02/15/2016   HGBA1C 7.2 11/15/2015   ESRSEDRATE 26 (H) 11/03/2017   CRP 1.8 (H) 11/03/2017   REPTSTATUS 11/08/2017 FINAL 11/03/2017   CULT NO GROWTH 5 DAYS 11/03/2017    @LABSALLVALUES (HGBA1)@  Body mass index is 28.62 kg/m.  Orders:  No orders of the defined types were placed in this  encounter.  No orders of the defined types were placed in this encounter.    Procedures: No procedures performed  Clinical Data: No additional findings.  ROS:  All other systems negative, except as noted in the HPI. Review of Systems  Objective: Vital Signs: Ht 5\' 5"  (1.651 m)   Wt 172 lb (78 kg)   BMI 28.62 kg/m   Specialty Comments:  No specialty comments available.  PMFS History: Patient Active Problem List   Diagnosis Date Noted  . S/P split thickness skin graft 11/29/2017  . Diabetic leg ulcer ([place])   . Atherosclerosis of native arteries of extremities with gangrene, right leg ([place])   . Wound infection 11/03/2017  . Hypoalbuminemia 11/03/2017  . Venous stasis ulcer ([place]) 11/03/2017  . BMI 26.0-26.9,adult 11/15/2015  . Family history of malignant neoplasm of gastrointestinal tract 06/30/2013  . Hyperlipidemia 06/02/2013  . Diabetes mellitus ([place]) 03/29/2011  . Osteoporosis 03/29/2011  . Iron deficiency anemia 12/29/2010  . Essential hypertension 09/08/2009  . Aortic valve disorder 09/08/2009  . Secondary cardiomyopathy ([place]) 09/08/2009   Past Medical History:  Diagnosis Date  . Aortic regurgitation    Mild  . Aortic stenosis    Moderate  . Coronary atherosclerosis of native coronary artery    Nonobstructive 2007  . Essential hypertension, benign   . Hyperlipidemia   . Hypothyroidism   .  Nonischemic cardiomyopathy (HCC)    LVEF improved to 50-55%  . Osteoporosis   . Type 2 diabetes mellitus (HCC)     Family History  Problem Relation Age of Onset  . Colon cancer Mother   . Colon cancer Brother   . Diabetes Brother   . Heart disease Brother   . Diabetes Brother   . Cancer Brother        prostate  . Prostate cancer Brother   . Cancer Brother   . Cancer Sister        stomach  . Heart failure Daughter   . Thyroid disease Daughter   . Cancer Sister        lung  . Heart disease Sister     Past Surgical History:  Procedure Laterality Date  .  LOWER EXTREMITY ANGIOGRAPHY N/A 11/13/2017   Procedure: LOWER EXTREMITY ANGIOGRAPHY;  Surgeon: Serafina Mitchell, MD;  Location: [facility];  Service: Cardiovascular;  Laterality: N/A;  . SKIN GRAFT    . SKIN SPLIT GRAFT Right 11/09/2017   Procedure: RIGHT LEG DEBRIDE ACHILLES, SPLIT THICKNESS SKIN GRAFT, VAC;  Surgeon: Newt Minion, MD;  Location: [facility];  Service: Orthopedics;  Laterality: Right;   Social History   Occupational History    Employer: RETIRED    Comment[employer] worker  Tobacco Use  . Smoking status: Former Smoker    Packs/day: 0.50    Years: 30.00    Pack years: 15.00    Types: Cigarettes    Last attempt to quit: 12/25/1988    Years since quitting: 29.2  . Smokeless tobacco: Never Used  Substance and Sexual Activity  . Alcohol use: No    Alcohol/week: 0.0 oz  . Drug use: No  . Sexual activity: Never

## 2018-04-04 NOTE — Addendum Note (Signed)
Addended by: Meridee Score on: 04/04/2018 10:42 AM   Modules accepted: Orders

## 2018-04-05 ENCOUNTER — Telehealth (INDEPENDENT_AMBULATORY_CARE_PROVIDER_SITE_OTHER): Payer: Self-pay | Admitting: Orthopedic Surgery

## 2018-04-05 DIAGNOSIS — Z48817 Encounter for surgical aftercare following surgery on the skin and subcutaneous tissue: Secondary | ICD-10-CM | POA: Diagnosis not present

## 2018-04-05 DIAGNOSIS — I110 Hypertensive heart disease with heart failure: Secondary | ICD-10-CM | POA: Diagnosis not present

## 2018-04-05 DIAGNOSIS — I2510 Atherosclerotic heart disease of native coronary artery without angina pectoris: Secondary | ICD-10-CM | POA: Diagnosis not present

## 2018-04-05 DIAGNOSIS — I87332 Chronic venous hypertension (idiopathic) with ulcer and inflammation of left lower extremity: Secondary | ICD-10-CM | POA: Diagnosis not present

## 2018-04-05 DIAGNOSIS — I70232 Atherosclerosis of native arteries of right leg with ulceration of calf: Secondary | ICD-10-CM | POA: Diagnosis not present

## 2018-04-05 DIAGNOSIS — L97212 Non-pressure chronic ulcer of right calf with fat layer exposed: Secondary | ICD-10-CM | POA: Diagnosis not present

## 2018-04-05 DIAGNOSIS — E1151 Type 2 diabetes mellitus with diabetic peripheral angiopathy without gangrene: Secondary | ICD-10-CM | POA: Diagnosis not present

## 2018-04-05 DIAGNOSIS — Z945 Skin transplant status: Secondary | ICD-10-CM | POA: Diagnosis not present

## 2018-04-05 DIAGNOSIS — L97821 Non-pressure chronic ulcer of other part of left lower leg limited to breakdown of skin: Secondary | ICD-10-CM | POA: Diagnosis not present

## 2018-04-05 NOTE — Telephone Encounter (Signed)
Jeanmarie Hubert -nurse with [facility] called needing clarifications on wound care orders. The number to contact Angie is [phone]

## 2018-04-05 NOTE — Telephone Encounter (Signed)
Called and sw [facility] advised we applied a compression wrap to her leg yesterday Dr. Sharol Given wants [facility] to apply this to the RLE twice a week for the next 4 weeks and then she will follow up with [place] in the office. She is to wear her medial grade compression sock to the RLE daily.

## 2018-04-08 DIAGNOSIS — L97821 Non-pressure chronic ulcer of other part of left lower leg limited to breakdown of skin: Secondary | ICD-10-CM | POA: Diagnosis not present

## 2018-04-08 DIAGNOSIS — L97212 Non-pressure chronic ulcer of right calf with fat layer exposed: Secondary | ICD-10-CM | POA: Diagnosis not present

## 2018-04-08 DIAGNOSIS — E1151 Type 2 diabetes mellitus with diabetic peripheral angiopathy without gangrene: Secondary | ICD-10-CM | POA: Diagnosis not present

## 2018-04-08 DIAGNOSIS — I87332 Chronic venous hypertension (idiopathic) with ulcer and inflammation of left lower extremity: Secondary | ICD-10-CM | POA: Diagnosis not present

## 2018-04-08 DIAGNOSIS — I2510 Atherosclerotic heart disease of native coronary artery without angina pectoris: Secondary | ICD-10-CM | POA: Diagnosis not present

## 2018-04-08 DIAGNOSIS — Z945 Skin transplant status: Secondary | ICD-10-CM | POA: Diagnosis not present

## 2018-04-08 DIAGNOSIS — I110 Hypertensive heart disease with heart failure: Secondary | ICD-10-CM | POA: Diagnosis not present

## 2018-04-08 DIAGNOSIS — I70232 Atherosclerosis of native arteries of right leg with ulceration of calf: Secondary | ICD-10-CM | POA: Diagnosis not present

## 2018-04-08 DIAGNOSIS — Z48817 Encounter for surgical aftercare following surgery on the skin and subcutaneous tissue: Secondary | ICD-10-CM | POA: Diagnosis not present

## 2018-04-11 DIAGNOSIS — I110 Hypertensive heart disease with heart failure: Secondary | ICD-10-CM | POA: Diagnosis not present

## 2018-04-11 DIAGNOSIS — L97212 Non-pressure chronic ulcer of right calf with fat layer exposed: Secondary | ICD-10-CM | POA: Diagnosis not present

## 2018-04-11 DIAGNOSIS — E1151 Type 2 diabetes mellitus with diabetic peripheral angiopathy without gangrene: Secondary | ICD-10-CM | POA: Diagnosis not present

## 2018-04-11 DIAGNOSIS — L97821 Non-pressure chronic ulcer of other part of left lower leg limited to breakdown of skin: Secondary | ICD-10-CM | POA: Diagnosis not present

## 2018-04-11 DIAGNOSIS — I70232 Atherosclerosis of native arteries of right leg with ulceration of calf: Secondary | ICD-10-CM | POA: Diagnosis not present

## 2018-04-11 DIAGNOSIS — I2510 Atherosclerotic heart disease of native coronary artery without angina pectoris: Secondary | ICD-10-CM | POA: Diagnosis not present

## 2018-04-11 DIAGNOSIS — Z945 Skin transplant status: Secondary | ICD-10-CM | POA: Diagnosis not present

## 2018-04-11 DIAGNOSIS — Z48817 Encounter for surgical aftercare following surgery on the skin and subcutaneous tissue: Secondary | ICD-10-CM | POA: Diagnosis not present

## 2018-04-11 DIAGNOSIS — I87332 Chronic venous hypertension (idiopathic) with ulcer and inflammation of left lower extremity: Secondary | ICD-10-CM | POA: Diagnosis not present

## 2018-04-15 DIAGNOSIS — E1151 Type 2 diabetes mellitus with diabetic peripheral angiopathy without gangrene: Secondary | ICD-10-CM | POA: Diagnosis not present

## 2018-04-15 DIAGNOSIS — Z945 Skin transplant status: Secondary | ICD-10-CM | POA: Diagnosis not present

## 2018-04-15 DIAGNOSIS — I110 Hypertensive heart disease with heart failure: Secondary | ICD-10-CM | POA: Diagnosis not present

## 2018-04-15 DIAGNOSIS — I87332 Chronic venous hypertension (idiopathic) with ulcer and inflammation of left lower extremity: Secondary | ICD-10-CM | POA: Diagnosis not present

## 2018-04-15 DIAGNOSIS — I2510 Atherosclerotic heart disease of native coronary artery without angina pectoris: Secondary | ICD-10-CM | POA: Diagnosis not present

## 2018-04-15 DIAGNOSIS — L97212 Non-pressure chronic ulcer of right calf with fat layer exposed: Secondary | ICD-10-CM | POA: Diagnosis not present

## 2018-04-15 DIAGNOSIS — I70232 Atherosclerosis of native arteries of right leg with ulceration of calf: Secondary | ICD-10-CM | POA: Diagnosis not present

## 2018-04-15 DIAGNOSIS — Z48817 Encounter for surgical aftercare following surgery on the skin and subcutaneous tissue: Secondary | ICD-10-CM | POA: Diagnosis not present

## 2018-04-15 DIAGNOSIS — L97821 Non-pressure chronic ulcer of other part of left lower leg limited to breakdown of skin: Secondary | ICD-10-CM | POA: Diagnosis not present

## 2018-04-18 DIAGNOSIS — I87332 Chronic venous hypertension (idiopathic) with ulcer and inflammation of left lower extremity: Secondary | ICD-10-CM | POA: Diagnosis not present

## 2018-04-18 DIAGNOSIS — Z945 Skin transplant status: Secondary | ICD-10-CM | POA: Diagnosis not present

## 2018-04-18 DIAGNOSIS — E1151 Type 2 diabetes mellitus with diabetic peripheral angiopathy without gangrene: Secondary | ICD-10-CM | POA: Diagnosis not present

## 2018-04-18 DIAGNOSIS — I2510 Atherosclerotic heart disease of native coronary artery without angina pectoris: Secondary | ICD-10-CM | POA: Diagnosis not present

## 2018-04-18 DIAGNOSIS — L97821 Non-pressure chronic ulcer of other part of left lower leg limited to breakdown of skin: Secondary | ICD-10-CM | POA: Diagnosis not present

## 2018-04-18 DIAGNOSIS — Z48817 Encounter for surgical aftercare following surgery on the skin and subcutaneous tissue: Secondary | ICD-10-CM | POA: Diagnosis not present

## 2018-04-18 DIAGNOSIS — I110 Hypertensive heart disease with heart failure: Secondary | ICD-10-CM | POA: Diagnosis not present

## 2018-04-18 DIAGNOSIS — L97212 Non-pressure chronic ulcer of right calf with fat layer exposed: Secondary | ICD-10-CM | POA: Diagnosis not present

## 2018-04-18 DIAGNOSIS — I70232 Atherosclerosis of native arteries of right leg with ulceration of calf: Secondary | ICD-10-CM | POA: Diagnosis not present

## 2018-04-22 DIAGNOSIS — Z945 Skin transplant status: Secondary | ICD-10-CM | POA: Diagnosis not present

## 2018-04-22 DIAGNOSIS — I70232 Atherosclerosis of native arteries of right leg with ulceration of calf: Secondary | ICD-10-CM | POA: Diagnosis not present

## 2018-04-22 DIAGNOSIS — I2510 Atherosclerotic heart disease of native coronary artery without angina pectoris: Secondary | ICD-10-CM | POA: Diagnosis not present

## 2018-04-22 DIAGNOSIS — L97821 Non-pressure chronic ulcer of other part of left lower leg limited to breakdown of skin: Secondary | ICD-10-CM | POA: Diagnosis not present

## 2018-04-22 DIAGNOSIS — L97212 Non-pressure chronic ulcer of right calf with fat layer exposed: Secondary | ICD-10-CM | POA: Diagnosis not present

## 2018-04-22 DIAGNOSIS — E1151 Type 2 diabetes mellitus with diabetic peripheral angiopathy without gangrene: Secondary | ICD-10-CM | POA: Diagnosis not present

## 2018-04-22 DIAGNOSIS — I110 Hypertensive heart disease with heart failure: Secondary | ICD-10-CM | POA: Diagnosis not present

## 2018-04-22 DIAGNOSIS — Z48817 Encounter for surgical aftercare following surgery on the skin and subcutaneous tissue: Secondary | ICD-10-CM | POA: Diagnosis not present

## 2018-04-22 DIAGNOSIS — I87332 Chronic venous hypertension (idiopathic) with ulcer and inflammation of left lower extremity: Secondary | ICD-10-CM | POA: Diagnosis not present

## 2018-04-25 DIAGNOSIS — Z48817 Encounter for surgical aftercare following surgery on the skin and subcutaneous tissue: Secondary | ICD-10-CM | POA: Diagnosis not present

## 2018-04-25 DIAGNOSIS — I110 Hypertensive heart disease with heart failure: Secondary | ICD-10-CM | POA: Diagnosis not present

## 2018-04-25 DIAGNOSIS — Z945 Skin transplant status: Secondary | ICD-10-CM | POA: Diagnosis not present

## 2018-04-25 DIAGNOSIS — L97212 Non-pressure chronic ulcer of right calf with fat layer exposed: Secondary | ICD-10-CM | POA: Diagnosis not present

## 2018-04-25 DIAGNOSIS — I2510 Atherosclerotic heart disease of native coronary artery without angina pectoris: Secondary | ICD-10-CM | POA: Diagnosis not present

## 2018-04-25 DIAGNOSIS — E1151 Type 2 diabetes mellitus with diabetic peripheral angiopathy without gangrene: Secondary | ICD-10-CM | POA: Diagnosis not present

## 2018-04-25 DIAGNOSIS — I70232 Atherosclerosis of native arteries of right leg with ulceration of calf: Secondary | ICD-10-CM | POA: Diagnosis not present

## 2018-04-25 DIAGNOSIS — I87332 Chronic venous hypertension (idiopathic) with ulcer and inflammation of left lower extremity: Secondary | ICD-10-CM | POA: Diagnosis not present

## 2018-04-25 DIAGNOSIS — L97821 Non-pressure chronic ulcer of other part of left lower leg limited to breakdown of skin: Secondary | ICD-10-CM | POA: Diagnosis not present

## 2018-04-29 DIAGNOSIS — L97821 Non-pressure chronic ulcer of other part of left lower leg limited to breakdown of skin: Secondary | ICD-10-CM | POA: Diagnosis not present

## 2018-04-29 DIAGNOSIS — I87332 Chronic venous hypertension (idiopathic) with ulcer and inflammation of left lower extremity: Secondary | ICD-10-CM | POA: Diagnosis not present

## 2018-04-29 DIAGNOSIS — Z48817 Encounter for surgical aftercare following surgery on the skin and subcutaneous tissue: Secondary | ICD-10-CM | POA: Diagnosis not present

## 2018-04-29 DIAGNOSIS — I2510 Atherosclerotic heart disease of native coronary artery without angina pectoris: Secondary | ICD-10-CM | POA: Diagnosis not present

## 2018-04-29 DIAGNOSIS — I70232 Atherosclerosis of native arteries of right leg with ulceration of calf: Secondary | ICD-10-CM | POA: Diagnosis not present

## 2018-04-29 DIAGNOSIS — I110 Hypertensive heart disease with heart failure: Secondary | ICD-10-CM | POA: Diagnosis not present

## 2018-04-29 DIAGNOSIS — E1151 Type 2 diabetes mellitus with diabetic peripheral angiopathy without gangrene: Secondary | ICD-10-CM | POA: Diagnosis not present

## 2018-04-29 DIAGNOSIS — Z945 Skin transplant status: Secondary | ICD-10-CM | POA: Diagnosis not present

## 2018-04-29 DIAGNOSIS — L97212 Non-pressure chronic ulcer of right calf with fat layer exposed: Secondary | ICD-10-CM | POA: Diagnosis not present

## 2018-05-02 DIAGNOSIS — L97821 Non-pressure chronic ulcer of other part of left lower leg limited to breakdown of skin: Secondary | ICD-10-CM | POA: Diagnosis not present

## 2018-05-02 DIAGNOSIS — E1151 Type 2 diabetes mellitus with diabetic peripheral angiopathy without gangrene: Secondary | ICD-10-CM | POA: Diagnosis not present

## 2018-05-02 DIAGNOSIS — Z48817 Encounter for surgical aftercare following surgery on the skin and subcutaneous tissue: Secondary | ICD-10-CM | POA: Diagnosis not present

## 2018-05-02 DIAGNOSIS — I70232 Atherosclerosis of native arteries of right leg with ulceration of calf: Secondary | ICD-10-CM | POA: Diagnosis not present

## 2018-05-02 DIAGNOSIS — Z945 Skin transplant status: Secondary | ICD-10-CM | POA: Diagnosis not present

## 2018-05-02 DIAGNOSIS — I2510 Atherosclerotic heart disease of native coronary artery without angina pectoris: Secondary | ICD-10-CM | POA: Diagnosis not present

## 2018-05-02 DIAGNOSIS — L97212 Non-pressure chronic ulcer of right calf with fat layer exposed: Secondary | ICD-10-CM | POA: Diagnosis not present

## 2018-05-02 DIAGNOSIS — I110 Hypertensive heart disease with heart failure: Secondary | ICD-10-CM | POA: Diagnosis not present

## 2018-05-02 DIAGNOSIS — I87332 Chronic venous hypertension (idiopathic) with ulcer and inflammation of left lower extremity: Secondary | ICD-10-CM | POA: Diagnosis not present

## 2018-05-06 ENCOUNTER — Ambulatory Visit (INDEPENDENT_AMBULATORY_CARE_PROVIDER_SITE_OTHER): Payer: Medicare HMO | Admitting: Orthopedic Surgery

## 2018-05-06 ENCOUNTER — Encounter (INDEPENDENT_AMBULATORY_CARE_PROVIDER_SITE_OTHER): Payer: Self-pay | Admitting: Orthopedic Surgery

## 2018-05-06 VITALS — Ht 65.0 in | Wt 172.0 lb

## 2018-05-06 DIAGNOSIS — L97215 Non-pressure chronic ulcer of right calf with muscle involvement without evidence of necrosis: Secondary | ICD-10-CM | POA: Diagnosis not present

## 2018-05-06 DIAGNOSIS — I83012 Varicose veins of right lower extremity with ulcer of calf: Secondary | ICD-10-CM

## 2018-05-06 NOTE — Progress Notes (Signed)
Office Visit Note   Patient: Emily Wall           Date of Birth: 08/14/1936           MRN: [id number] Visit Date: 05/06/2018              Requested by: Chevis Pretty, [address] [place], [place] [address] PCP: Chevis Pretty, FNP  Chief Complaint  Patient presents with  . Right Leg - Follow-up, Pain, Edema      HPI: Patient is a 82 year old woman who presents in follow-up for venous ulcer right calf she is undergoing compression wraps at home with home health nursing.  Patient states she developed a blister over the dorsum of the second toe of the left foot.  Assessment & Plan: Visit Diagnoses:  1. Venous stasis ulcer of right calf with muscle involvement without evidence of necrosis with varicose veins ([place])     Plan: We will reapply Dynaflex wrap to the right leg anticipate she could follow-up in a week if this should be completely healed we will transfer to the medical compression stockings at follow-up.  She will wear the medical compression stocking on the left foot at this time washing the wound with soap and water daily.  Follow-Up Instructions: Return in about 1 week (around 05/13/2018).   Ortho Exam  Patient is alert, oriented, no adenopathy, well-dressed, normal affect, normal respiratory effort. Examination patient has a superficial blister over the dorsum of the PIP joint left foot second toe this has 100% beefy granulation tissue with 7 mm in diameter 0.1 mm deep there is no exposed bone or tendon no swelling no cellulitis no signs of infection this appears to be more of a blister that she obtained from her slip on shoes.  Right leg is showing excellent healing with excellent resolution of the swelling the ulcer is almost completely healed with a very superficial wound with superficial epithelialization remaining.  Imaging: No results found. No images are attached to the encounter.  Labs: Lab Results  Component Value Date   HGBA1C  8.1 (H) 11/03/2017   HGBA1C 6.7 02/15/2016   HGBA1C 7.2 11/15/2015   ESRSEDRATE 26 (H) 11/03/2017   CRP 1.8 (H) 11/03/2017   REPTSTATUS 11/08/2017 FINAL 11/03/2017   CULT NO GROWTH 5 DAYS 11/03/2017     Lab Results  Component Value Date   ALBUMIN 2.9 (L) 02/01/2018   ALBUMIN 2.8 (L) 01/08/2018   ALBUMIN 2.4 (L) 11/07/2017   PREALBUMIN 6.1 (L) 11/03/2017    Body mass index is 28.62 kg/m.  Orders:  No orders of the defined types were placed in this encounter.  No orders of the defined types were placed in this encounter.    Procedures: No procedures performed  Clinical Data: No additional findings.  ROS:  All other systems negative, except as noted in the HPI. Review of Systems  Objective: Vital Signs: Ht 5\' 5"  (1.651 m)   Wt 172 lb (78 kg)   BMI 28.62 kg/m   Specialty Comments:  No specialty comments available.  PMFS History: Patient Active Problem List   Diagnosis Date Noted  . S/P split thickness skin graft 11/29/2017  . Diabetic leg ulcer ([place])   . Atherosclerosis of native arteries of extremities with gangrene, right leg ([place])   . Wound infection 11/03/2017  . Hypoalbuminemia 11/03/2017  . Venous stasis ulcer ([place]) 11/03/2017  . BMI 26.0-26.9,adult 11/15/2015  . Family history of malignant neoplasm of gastrointestinal tract 06/30/2013  . Hyperlipidemia  06/02/2013  . Diabetes mellitus ([place]) 03/29/2011  . Osteoporosis 03/29/2011  . Iron deficiency anemia 12/29/2010  . Essential hypertension 09/08/2009  . Aortic valve disorder 09/08/2009  . Secondary cardiomyopathy ([place]) 09/08/2009   Past Medical History:  Diagnosis Date  . Aortic regurgitation    Mild  . Aortic stenosis    Moderate  . Coronary atherosclerosis of native coronary artery    Nonobstructive 2007  . Essential hypertension, benign   . Hyperlipidemia   . Hypothyroidism   . Nonischemic cardiomyopathy (HCC)    LVEF improved to 50-55%  . Osteoporosis   . Type 2 diabetes mellitus  (HCC)     Family History  Problem Relation Age of Onset  . Colon cancer Mother   . Colon cancer Brother   . Diabetes Brother   . Heart disease Brother   . Diabetes Brother   . Cancer Brother        prostate  . Prostate cancer Brother   . Cancer Brother   . Cancer Sister        stomach  . Heart failure Daughter   . Thyroid disease Daughter   . Cancer Sister        lung  . Heart disease Sister     Past Surgical History:  Procedure Laterality Date  . LOWER EXTREMITY ANGIOGRAPHY N/A 11/13/2017   Procedure: LOWER EXTREMITY ANGIOGRAPHY;  Surgeon: Serafina Mitchell, MD;  Location: [facility];  Service: Cardiovascular;  Laterality: N/A;  . SKIN GRAFT    . SKIN SPLIT GRAFT Right 11/09/2017   Procedure: RIGHT LEG DEBRIDE ACHILLES, SPLIT THICKNESS SKIN GRAFT, VAC;  Surgeon: Newt Minion, MD;  Location: [facility];  Service: Orthopedics;  Laterality: Right;   Social History   Occupational History    Employer: RETIRED    Comment[employer] worker  Tobacco Use  . Smoking status: Former Smoker    Packs/day: 0.50    Years: 30.00    Pack years: 15.00    Types: Cigarettes    Last attempt to quit: 12/25/1988    Years since quitting: 29.3  . Smokeless tobacco: Never Used  Substance and Sexual Activity  . Alcohol use: No    Alcohol/week: 0.0 oz  . Drug use: No  . Sexual activity: Never

## 2018-05-07 ENCOUNTER — Telehealth (INDEPENDENT_AMBULATORY_CARE_PROVIDER_SITE_OTHER): Payer: Self-pay | Admitting: Orthopedic Surgery

## 2018-05-07 NOTE — Telephone Encounter (Signed)
error 

## 2018-05-09 DIAGNOSIS — I2510 Atherosclerotic heart disease of native coronary artery without angina pectoris: Secondary | ICD-10-CM | POA: Diagnosis not present

## 2018-05-09 DIAGNOSIS — L97821 Non-pressure chronic ulcer of other part of left lower leg limited to breakdown of skin: Secondary | ICD-10-CM | POA: Diagnosis not present

## 2018-05-09 DIAGNOSIS — Z945 Skin transplant status: Secondary | ICD-10-CM | POA: Diagnosis not present

## 2018-05-09 DIAGNOSIS — I70232 Atherosclerosis of native arteries of right leg with ulceration of calf: Secondary | ICD-10-CM | POA: Diagnosis not present

## 2018-05-09 DIAGNOSIS — E1151 Type 2 diabetes mellitus with diabetic peripheral angiopathy without gangrene: Secondary | ICD-10-CM | POA: Diagnosis not present

## 2018-05-09 DIAGNOSIS — I110 Hypertensive heart disease with heart failure: Secondary | ICD-10-CM | POA: Diagnosis not present

## 2018-05-09 DIAGNOSIS — Z48817 Encounter for surgical aftercare following surgery on the skin and subcutaneous tissue: Secondary | ICD-10-CM | POA: Diagnosis not present

## 2018-05-09 DIAGNOSIS — L97212 Non-pressure chronic ulcer of right calf with fat layer exposed: Secondary | ICD-10-CM | POA: Diagnosis not present

## 2018-05-09 DIAGNOSIS — I87332 Chronic venous hypertension (idiopathic) with ulcer and inflammation of left lower extremity: Secondary | ICD-10-CM | POA: Diagnosis not present

## 2018-05-11 ENCOUNTER — Other Ambulatory Visit: Payer: Self-pay | Admitting: Nurse Practitioner

## 2018-05-11 DIAGNOSIS — D509 Iron deficiency anemia, unspecified: Secondary | ICD-10-CM

## 2018-05-13 ENCOUNTER — Telehealth (INDEPENDENT_AMBULATORY_CARE_PROVIDER_SITE_OTHER): Payer: Self-pay | Admitting: Orthopedic Surgery

## 2018-05-13 DIAGNOSIS — I70232 Atherosclerosis of native arteries of right leg with ulceration of calf: Secondary | ICD-10-CM | POA: Diagnosis not present

## 2018-05-13 DIAGNOSIS — Z945 Skin transplant status: Secondary | ICD-10-CM | POA: Diagnosis not present

## 2018-05-13 DIAGNOSIS — I110 Hypertensive heart disease with heart failure: Secondary | ICD-10-CM | POA: Diagnosis not present

## 2018-05-13 DIAGNOSIS — E1151 Type 2 diabetes mellitus with diabetic peripheral angiopathy without gangrene: Secondary | ICD-10-CM | POA: Diagnosis not present

## 2018-05-13 DIAGNOSIS — I2510 Atherosclerotic heart disease of native coronary artery without angina pectoris: Secondary | ICD-10-CM | POA: Diagnosis not present

## 2018-05-13 DIAGNOSIS — L97821 Non-pressure chronic ulcer of other part of left lower leg limited to breakdown of skin: Secondary | ICD-10-CM | POA: Diagnosis not present

## 2018-05-13 DIAGNOSIS — L97212 Non-pressure chronic ulcer of right calf with fat layer exposed: Secondary | ICD-10-CM | POA: Diagnosis not present

## 2018-05-13 DIAGNOSIS — Z48817 Encounter for surgical aftercare following surgery on the skin and subcutaneous tissue: Secondary | ICD-10-CM | POA: Diagnosis not present

## 2018-05-13 DIAGNOSIS — I87332 Chronic venous hypertension (idiopathic) with ulcer and inflammation of left lower extremity: Secondary | ICD-10-CM | POA: Diagnosis not present

## 2018-05-13 NOTE — Telephone Encounter (Signed)
Emily Wall [id number]  Verbal Clarification   Verbal Orders  2 times a week for wound care

## 2018-05-14 NOTE — Telephone Encounter (Signed)
Called and gave verbal ok for twice a week compression wrap to RLE.

## 2018-05-16 ENCOUNTER — Ambulatory Visit (INDEPENDENT_AMBULATORY_CARE_PROVIDER_SITE_OTHER): Payer: Medicare HMO | Admitting: Orthopedic Surgery

## 2018-05-16 ENCOUNTER — Encounter (INDEPENDENT_AMBULATORY_CARE_PROVIDER_SITE_OTHER): Payer: Self-pay | Admitting: Orthopedic Surgery

## 2018-05-16 DIAGNOSIS — I2510 Atherosclerotic heart disease of native coronary artery without angina pectoris: Secondary | ICD-10-CM | POA: Diagnosis not present

## 2018-05-16 DIAGNOSIS — L97215 Non-pressure chronic ulcer of right calf with muscle involvement without evidence of necrosis: Secondary | ICD-10-CM | POA: Diagnosis not present

## 2018-05-16 DIAGNOSIS — E440 Moderate protein-calorie malnutrition: Secondary | ICD-10-CM | POA: Diagnosis not present

## 2018-05-16 DIAGNOSIS — E1151 Type 2 diabetes mellitus with diabetic peripheral angiopathy without gangrene: Secondary | ICD-10-CM | POA: Diagnosis not present

## 2018-05-16 DIAGNOSIS — I110 Hypertensive heart disease with heart failure: Secondary | ICD-10-CM | POA: Diagnosis not present

## 2018-05-16 DIAGNOSIS — D638 Anemia in other chronic diseases classified elsewhere: Secondary | ICD-10-CM | POA: Diagnosis not present

## 2018-05-16 DIAGNOSIS — I5042 Chronic combined systolic (congestive) and diastolic (congestive) heart failure: Secondary | ICD-10-CM | POA: Diagnosis not present

## 2018-05-16 DIAGNOSIS — I83012 Varicose veins of right lower extremity with ulcer of calf: Secondary | ICD-10-CM | POA: Diagnosis not present

## 2018-05-16 DIAGNOSIS — I70232 Atherosclerosis of native arteries of right leg with ulceration of calf: Secondary | ICD-10-CM | POA: Diagnosis not present

## 2018-05-16 DIAGNOSIS — L97211 Non-pressure chronic ulcer of right calf limited to breakdown of skin: Secondary | ICD-10-CM | POA: Diagnosis not present

## 2018-05-16 DIAGNOSIS — I87302 Chronic venous hypertension (idiopathic) without complications of left lower extremity: Secondary | ICD-10-CM | POA: Diagnosis not present

## 2018-05-16 NOTE — Progress Notes (Signed)
Office Visit Note   Patient: Emily Wall           Date of Birth: [birth year]           MRN: [id number] Visit Date: 05/16/2018              Requested by: Chevis Pretty, [address] [place], [place] [address] PCP: Chevis Pretty, FNP  Chief Complaint  Patient presents with  . Right Leg - Wound Check      HPI: Patient is a 82 year old woman with venous stasis insufficiency ulcers right lower extremity she has been in a compression wrap feels like her leg is improving.  Assessment & Plan: Visit Diagnoses:  1. Venous stasis ulcer of right calf with muscle involvement without evidence of necrosis with varicose veins ([place])     Plan: We will advance her to the medical compression stocking size medium.  She will wear this around the clock change the sock as needed to wash her leg use Shea butter for moisturizing  Follow-Up Instructions: Return in about 3 weeks (around 06/06/2018).   Ortho Exam  Patient is alert, oriented, no adenopathy, well-dressed, normal affect, normal respiratory effort. Examination patient's wounds on the posterior aspect of her calf and anterior aspect of the tibia have excellent granulation tissue the posterior ulcer is 10 x 20 mm the anterior ulcer is 20 mm in diameter these are all about 1 mm deep.  There is no redness no cellulitis no drainage no signs of infection.  Imaging: No results found. No images are attached to the encounter.  Labs: Lab Results  Component Value Date   HGBA1C 8.1 (H) 11/03/2017   HGBA1C 6.7 02/15/2016   HGBA1C 7.2 11/15/2015   ESRSEDRATE 26 (H) 11/03/2017   CRP 1.8 (H) 11/03/2017   REPTSTATUS 11/08/2017 FINAL 11/03/2017   CULT NO GROWTH 5 DAYS 11/03/2017     Lab Results  Component Value Date   ALBUMIN 2.9 (L) 02/01/2018   ALBUMIN 2.8 (L) 01/08/2018   ALBUMIN 2.4 (L) 11/07/2017   PREALBUMIN 6.1 (L) 11/03/2017    There is no height or weight on file to calculate BMI.  Orders:  No orders  of the defined types were placed in this encounter.  No orders of the defined types were placed in this encounter.    Procedures: No procedures performed  Clinical Data: No additional findings.  ROS:  All other systems negative, except as noted in the HPI. Review of Systems  Objective: Vital Signs: There were no vitals taken for this visit.  Specialty Comments:  No specialty comments available.  PMFS History: Patient Active Problem List   Diagnosis Date Noted  . S/P split thickness skin graft 11/29/2017  . Diabetic leg ulcer ([place])   . Atherosclerosis of native arteries of extremities with gangrene, right leg ([place])   . Wound infection 11/03/2017  . Hypoalbuminemia 11/03/2017  . Venous stasis ulcer ([place]) 11/03/2017  . BMI 26.0-26.9,adult 11/15/2015  . Family history of malignant neoplasm of gastrointestinal tract 06/30/2013  . Hyperlipidemia 06/02/2013  . Diabetes mellitus ([place]) 03/29/2011  . Osteoporosis 03/29/2011  . Iron deficiency anemia 12/29/2010  . Essential hypertension 09/08/2009  . Aortic valve disorder 09/08/2009  . Secondary cardiomyopathy ([place]) 09/08/2009   Past Medical History:  Diagnosis Date  . Aortic regurgitation    Mild  . Aortic stenosis    Moderate  . Coronary atherosclerosis of native coronary artery    Nonobstructive 2007  . Essential hypertension, benign   .  Hyperlipidemia   . Hypothyroidism   . Nonischemic cardiomyopathy (HCC)    LVEF improved to 50-55%  . Osteoporosis   . Type 2 diabetes mellitus (HCC)     Family History  Problem Relation Age of Onset  . Colon cancer Mother   . Colon cancer Brother   . Diabetes Brother   . Heart disease Brother   . Diabetes Brother   . Cancer Brother        prostate  . Prostate cancer Brother   . Cancer Brother   . Cancer Sister        stomach  . Heart failure Daughter   . Thyroid disease Daughter   . Cancer Sister        lung  . Heart disease Sister     Past Surgical History:    Procedure Laterality Date  . LOWER EXTREMITY ANGIOGRAPHY N/A 11/13/2017   Procedure: LOWER EXTREMITY ANGIOGRAPHY;  Surgeon: Serafina Mitchell, MD;  Location: [facility];  Service: Cardiovascular;  Laterality: N/A;  . SKIN GRAFT    . SKIN SPLIT GRAFT Right 11/09/2017   Procedure: RIGHT LEG DEBRIDE ACHILLES, SPLIT THICKNESS SKIN GRAFT, VAC;  Surgeon: Newt Minion, MD;  Location: [facility];  Service: Orthopedics;  Laterality: Right;   Social History   Occupational History    Employer: RETIRED    Comment[employer] worker  Tobacco Use  . Smoking status: Former Smoker    Packs/day: 0.50    Years: 30.00    Pack years: 15.00    Types: Cigarettes    Last attempt to quit: 12/25/1988    Years since quitting: 29.4  . Smokeless tobacco: Never Used  Substance and Sexual Activity  . Alcohol use: No    Alcohol/week: 0.0 oz  . Drug use: No  . Sexual activity: Never

## 2018-05-20 DIAGNOSIS — E1151 Type 2 diabetes mellitus with diabetic peripheral angiopathy without gangrene: Secondary | ICD-10-CM | POA: Diagnosis not present

## 2018-05-20 DIAGNOSIS — I87302 Chronic venous hypertension (idiopathic) without complications of left lower extremity: Secondary | ICD-10-CM | POA: Diagnosis not present

## 2018-05-20 DIAGNOSIS — I110 Hypertensive heart disease with heart failure: Secondary | ICD-10-CM | POA: Diagnosis not present

## 2018-05-20 DIAGNOSIS — I70232 Atherosclerosis of native arteries of right leg with ulceration of calf: Secondary | ICD-10-CM | POA: Diagnosis not present

## 2018-05-20 DIAGNOSIS — E440 Moderate protein-calorie malnutrition: Secondary | ICD-10-CM | POA: Diagnosis not present

## 2018-05-20 DIAGNOSIS — I2510 Atherosclerotic heart disease of native coronary artery without angina pectoris: Secondary | ICD-10-CM | POA: Diagnosis not present

## 2018-05-20 DIAGNOSIS — L97211 Non-pressure chronic ulcer of right calf limited to breakdown of skin: Secondary | ICD-10-CM | POA: Diagnosis not present

## 2018-05-20 DIAGNOSIS — D638 Anemia in other chronic diseases classified elsewhere: Secondary | ICD-10-CM | POA: Diagnosis not present

## 2018-05-20 DIAGNOSIS — I5042 Chronic combined systolic (congestive) and diastolic (congestive) heart failure: Secondary | ICD-10-CM | POA: Diagnosis not present

## 2018-05-21 ENCOUNTER — Ambulatory Visit (INDEPENDENT_AMBULATORY_CARE_PROVIDER_SITE_OTHER): Payer: Medicare HMO | Admitting: Nurse Practitioner

## 2018-05-21 ENCOUNTER — Encounter: Payer: Self-pay | Admitting: Nurse Practitioner

## 2018-05-21 VITALS — BP 118/80 | HR 87 | Temp 97.0°F | Ht 65.0 in | Wt 160.0 lb

## 2018-05-21 DIAGNOSIS — E876 Hypokalemia: Secondary | ICD-10-CM | POA: Diagnosis not present

## 2018-05-21 DIAGNOSIS — L97909 Non-pressure chronic ulcer of unspecified part of unspecified lower leg with unspecified severity: Secondary | ICD-10-CM | POA: Diagnosis not present

## 2018-05-21 DIAGNOSIS — E11622 Type 2 diabetes mellitus with other skin ulcer: Secondary | ICD-10-CM | POA: Diagnosis not present

## 2018-05-21 DIAGNOSIS — E119 Type 2 diabetes mellitus without complications: Secondary | ICD-10-CM

## 2018-05-21 DIAGNOSIS — M810 Age-related osteoporosis without current pathological fracture: Secondary | ICD-10-CM | POA: Diagnosis not present

## 2018-05-21 DIAGNOSIS — Z6826 Body mass index (BMI) 26.0-26.9, adult: Secondary | ICD-10-CM | POA: Diagnosis not present

## 2018-05-21 DIAGNOSIS — D509 Iron deficiency anemia, unspecified: Secondary | ICD-10-CM | POA: Diagnosis not present

## 2018-05-21 DIAGNOSIS — I10 Essential (primary) hypertension: Secondary | ICD-10-CM

## 2018-05-21 DIAGNOSIS — E785 Hyperlipidemia, unspecified: Secondary | ICD-10-CM

## 2018-05-21 LAB — CMP14+EGFR
ALK PHOS: 71 IU/L (ref 39–117)
ALT: 8 IU/L (ref 0–32)
AST: 12 IU/L (ref 0–40)
Albumin/Globulin Ratio: 1.2 (ref 1.2–2.2)
Albumin: 3.4 g/dL — ABNORMAL LOW (ref 3.5–4.8)
BUN/Creatinine Ratio: 15 (ref 12–28)
BUN: 10 mg/dL (ref 8–27)
Bilirubin Total: 0.4 mg/dL (ref 0.0–1.2)
CALCIUM: 9 mg/dL (ref 8.7–10.3)
CO2: 25 mmol/L (ref 20–29)
CREATININE: 0.67 mg/dL (ref 0.57–1.00)
Chloride: 96 mmol/L (ref 96–106)
GFR calc Af Amer: 97 mL/min/{1.73_m2} (ref >59)
GFR calc non Af Amer: 84 mL/min/{1.73_m2} (ref >59)
GLOBULIN, TOTAL: 2.8 g/dL (ref 1.5–4.5)
Glucose: 179 mg/dL — ABNORMAL HIGH (ref 65–99)
POTASSIUM: 3.3 mmol/L — AB (ref 3.5–5.2)
SODIUM: 137 mmol/L (ref 134–144)
Total Protein: 6.2 g/dL (ref 6.0–8.5)

## 2018-05-21 LAB — LIPID PANEL
CHOLESTEROL TOTAL: 82 mg/dL — AB (ref 100–199)
Chol/HDL Ratio: 2.3 ratio (ref 0.0–4.4)
HDL: 35 mg/dL — AB (ref >39)
LDL CALC: 40 mg/dL (ref 0–99)
TRIGLYCERIDES: 37 mg/dL (ref 0–149)
VLDL Cholesterol Cal: 7 mg/dL (ref 5–40)

## 2018-05-21 LAB — BAYER DCA HB A1C WAIVED: HB A1C (BAYER DCA - WAIVED): 7.3 % — ABNORMAL HIGH (ref <7.0)

## 2018-05-21 MED ORDER — SITAGLIPTIN PHOSPHATE 100 MG PO TABS: 100.0000 mg | ORAL_TABLET | Freq: Every day | ORAL | 1 refills | Status: AC

## 2018-05-21 MED ORDER — CARVEDILOL 25 MG PO TABS: 25.0000 mg | ORAL_TABLET | Freq: Two times a day (BID) | ORAL | 5 refills | Status: AC

## 2018-05-21 MED ORDER — METFORMIN HCL 1000 MG PO TABS: 1000.0000 mg | ORAL_TABLET | Freq: Two times a day (BID) | ORAL | 0 refills | Status: AC

## 2018-05-21 MED ORDER — FERROUS SULFATE 325 (65 FE) MG PO TABS: ORAL_TABLET | ORAL | 1 refills | Status: AC

## 2018-05-21 NOTE — Progress Notes (Signed)
Subjective:    Patient ID: Emily Wall, female    DOB: 03/01/1936, 82 y.o.   MRN: [id number]   Chief Complaint: Medical Management of Chronic Issues   HPI:  1. Type 2 diabetes mellitus without complication, without long-term current use of insulin (HCC)  hgba1c has not been done recently. She has been through so much that sh ehas not been seen for follow up. She does not check blood sugars at home.  2. Essential hypertension  No c/o chest pain, sob or headache. Does not check blood pressure at home.  3. Hyperlipidemia, unspecified hyperlipidemia type  Not watchig diet  4. Diabetic leg ulcer ([place])  She is still going to wound care. And has some medicated socks that are causing scabe to come off.  5. Age-related osteoporosis without current pathological fracture  Last dexascan was done 02/26/18 with t score of -2.7. She denies any back pain  6. Iron deficiency anemia, unspecified iron deficiency anemia type  Last cbc was done at hospital and hgb was 9.9. She is taking iron supplements  7. BMI 26.0-26.9,adult  No recent weight changes    Outpatient Encounter Medications as of 05/21/2018  Medication Sig  . ACCU-CHEK SOFTCLIX LANCETS lancets Test blood sugar daily  . alendronate (FOSAMAX) 70 MG tablet TAKE 1 TABLET BY MOUTH ONCE A WEEK WITH A FULL GLASS OF WATER ON AN EMPTY STOMACH  . aspirin 325 MG tablet Take 325 mg by mouth daily.    Marland Kitchen atorvastatin (LIPITOR) 20 MG tablet TAKE 1 TABLET BY MOUTH EVERY DAY  . blood glucose meter kit and supplies Dispense based on patient and insurance preference. Use up to 2 times. (FOR ICD-10 E10.9, E11.9).  Marland Kitchen Blood Glucose Monitoring Suppl (ACCU-CHEK AVIVA PLUS) w/Device KIT Test blood sugar daily  . Calcium Carbonate-Vitamin D (CALCIUM + D PO) Take 1 tablet by mouth daily.    . carvedilol (COREG) 25 MG tablet TAKE 1 TABLET (25 MG TOTAL) BY MOUTH 2 (TWO) TIMES DAILY WITH A MEAL.  . cholecalciferol (VITAMIN D) 1000 UNITS tablet Take 1,000 Units by  mouth daily.   . ferrous sulfate 325 (65 FE) MG tablet TAKE 1 TABLET BY MOUTH EVERY DAY WITH BREAKFAST  . furosemide (LASIX) 40 MG tablet 1 po in am and 1/2 tablet at 1pm daily  . glucose blood (ACCU-CHEK AVIVA PLUS) test strip Test blood sugar daily  . lisinopril (PRINIVIL,ZESTRIL) 20 MG tablet TAKE 1 TABLET BY MOUTH EVERY DAY  . metFORMIN (GLUCOPHAGE) 1000 MG tablet TAKE 1 TABLET (1,000 MG TOTAL) BY MOUTH 2 (TWO) TIMES DAILY WITH A MEAL.  Marland Kitchen oxyCODONE-acetaminophen (PERCOCET/ROXICET) 5-325 MG tablet Take 1 tablet by mouth 2 (two) times daily as needed for severe pain.  . silver sulfADIAZINE (SILVADENE) 1 % cream Apply 1 application topically daily.  . sitaGLIPtin (JANUVIA) 100 MG tablet Take 100 mg daily by mouth.  . triamcinolone cream (KENALOG) 0.1 % APPLY TO AFFECTED AREA TWICE A DAY       New complaints: None today  Social history: Lives with her sister and her sisters grandchildren   Review of Systems  Constitutional: Negative for activity change and appetite change.  HENT: Negative.   Eyes: Negative for pain.  Respiratory: Negative for shortness of breath.   Cardiovascular: Negative for chest pain, palpitations and leg swelling.  Gastrointestinal: Negative for abdominal pain.  Endocrine: Negative for polydipsia.  Genitourinary: Negative.   Skin: Negative for rash.  Neurological: Negative for dizziness, weakness and headaches.  Hematological: Does not  bruise/bleed easily.  Psychiatric/Behavioral: Negative.   All other systems reviewed and are negative.      Objective:   Physical Exam  Constitutional: She is oriented to person, place, and time.  HENT:  Head: Normocephalic.  Nose: Nose normal.  Mouth/Throat: Oropharynx is clear and moist.  Eyes: Pupils are equal, round, and reactive to light. EOM are normal.  Neck: Normal range of motion. Neck supple. No JVD present. Carotid bruit is not present.  Cardiovascular: Normal rate, regular rhythm, normal heart sounds and  intact distal pulses.  Pulmonary/Chest: Effort normal and breath sounds normal. No respiratory distress. She has no wheezes. She has no rales. She exhibits no tenderness.  Abdominal: Soft. Normal appearance, normal aorta and bowel sounds are normal. She exhibits no distension, no abdominal bruit, no pulsatile midline mass and no mass. There is no splenomegaly or hepatomegaly. There is no tenderness.  Musculoskeletal: Normal range of motion. She exhibits no edema.  Lymphadenopathy:    She has no cervical adenopathy.  Neurological: She is alert and oriented to person, place, and time. She has normal reflexes.  Skin: Skin is warm and dry. Capillary refill takes 2 to 3 seconds.  Right ower leg has black medicated stocking. When she pulls it down dead skin falking off. Wound care told he rto expect that. Wound on right leg is covered with dressing- did not remove.  Psychiatric: She has a normal mood and affect. Her behavior is normal. Judgment and thought content normal.    BP 118/80   Pulse 87   Temp (!) 97 F (36.1 C) (Oral)   Ht 5' 5"  (1.651 m)   Wt 160 lb (72.6 kg)   BMI 26.63 kg/m       Assessment & Plan:  [name] comes in today with chief complaint of Medical Management of Chronic Issues   Diagnosis and orders addressed:  1. Type 2 diabetes mellitus without complication, without long-term current use of insulin (HCC) Continue to watch carbsin diet - Bayer DCA Hb A1c Waived - metFORMIN (GLUCOPHAGE) 1000 MG tablet; Take 1 tablet (1,000 mg total) by mouth 2 (two) times daily with a meal.  Dispense: 180 tablet; Refill: 0 - sitaGLIPtin (JANUVIA) 100 MG tablet; Take 1 tablet (100 mg total) by mouth daily.  Dispense: 90 tablet; Refill: 1  2. Essential hypertension .low sodium diet - CMP14+EGFR - carvedilol (COREG) 25 MG tablet; Take 1 tablet (25 mg total) by mouth 2 (two) times daily with a meal.  Dispense: 60 tablet; Refill: 5  3. Hyperlipidemia, unspecified hyperlipidemia  type Low fat diet - Lipid panel  4. Diabetic leg ulcer ([place]) continue to go to wound clinic  5. Age-related osteoporosis without current pathological fracture Weight bearing exercises  6. Iron deficiency anemia, unspecified iron deficiency anemia type Make sure take daily iron supplements - CBC with Differential/Platelet - ferrous sulfate 325 (65 FE) MG tablet; TAKE 1 TABLET BY MOUTH EVERY DAY WITH BREAKFAST  Dispense: 90 tablet; Refill: 1  7. BMI 26.0-26.9,adult Discussed diet and exercise for person with BMI >25 Will recheck weight in 3-6 months   Labs pending Health Maintenance reviewed Diet and exercise encouraged  Follow up plan: 3 months   Mary-Margaret Hassell Done, FNP

## 2018-05-21 NOTE — Patient Instructions (Signed)

## 2018-05-22 LAB — CBC WITH DIFFERENTIAL/PLATELET
BASOS: 0 %
Basophils Absolute: 0 10*3/uL (ref 0.0–0.2)
EOS (ABSOLUTE): 0.1 10*3/uL (ref 0.0–0.4)
EOS: 2 %
HEMATOCRIT: 30.8 % — AB (ref 34.0–46.6)
Hemoglobin: 9.9 g/dL — ABNORMAL LOW (ref 11.1–15.9)
IMMATURE GRANULOCYTES: 0 %
Immature Grans (Abs): 0 10*3/uL (ref 0.0–0.1)
LYMPHS ABS: 1.2 10*3/uL (ref 0.7–3.1)
Lymphs: 34 %
MCH: 30.7 pg (ref 26.6–33.0)
MCHC: 32.1 g/dL (ref 31.5–35.7)
MCV: 96 fL (ref 79–97)
MONOCYTES: 6 %
Monocytes Absolute: 0.2 10*3/uL (ref 0.1–0.9)
NEUTROS PCT: 58 %
Neutrophils Absolute: 2 10*3/uL (ref 1.4–7.0)
Platelets: 162 10*3/uL (ref 150–450)
RBC: 3.22 x10E6/uL — AB (ref 3.77–5.28)
RDW: 15.9 % — ABNORMAL HIGH (ref 12.3–15.4)
WBC: 3.4 10*3/uL (ref 3.4–10.8)

## 2018-05-23 ENCOUNTER — Telehealth (INDEPENDENT_AMBULATORY_CARE_PROVIDER_SITE_OTHER): Payer: Self-pay | Admitting: Orthopedic Surgery

## 2018-05-23 DIAGNOSIS — D638 Anemia in other chronic diseases classified elsewhere: Secondary | ICD-10-CM | POA: Diagnosis not present

## 2018-05-23 DIAGNOSIS — E440 Moderate protein-calorie malnutrition: Secondary | ICD-10-CM | POA: Diagnosis not present

## 2018-05-23 DIAGNOSIS — L97215 Non-pressure chronic ulcer of right calf with muscle involvement without evidence of necrosis: Principal | ICD-10-CM

## 2018-05-23 DIAGNOSIS — L97211 Non-pressure chronic ulcer of right calf limited to breakdown of skin: Secondary | ICD-10-CM | POA: Diagnosis not present

## 2018-05-23 DIAGNOSIS — I2510 Atherosclerotic heart disease of native coronary artery without angina pectoris: Secondary | ICD-10-CM | POA: Diagnosis not present

## 2018-05-23 DIAGNOSIS — E1151 Type 2 diabetes mellitus with diabetic peripheral angiopathy without gangrene: Secondary | ICD-10-CM | POA: Diagnosis not present

## 2018-05-23 DIAGNOSIS — I83012 Varicose veins of right lower extremity with ulcer of calf: Secondary | ICD-10-CM

## 2018-05-23 DIAGNOSIS — I5042 Chronic combined systolic (congestive) and diastolic (congestive) heart failure: Secondary | ICD-10-CM | POA: Diagnosis not present

## 2018-05-23 DIAGNOSIS — I70232 Atherosclerosis of native arteries of right leg with ulceration of calf: Secondary | ICD-10-CM | POA: Diagnosis not present

## 2018-05-23 DIAGNOSIS — I110 Hypertensive heart disease with heart failure: Secondary | ICD-10-CM | POA: Diagnosis not present

## 2018-05-23 DIAGNOSIS — I87302 Chronic venous hypertension (idiopathic) without complications of left lower extremity: Secondary | ICD-10-CM | POA: Diagnosis not present

## 2018-05-23 MED ORDER — POTASSIUM CHLORIDE ER 10 MEQ PO TBCR: 10.0000 meq | EXTENDED_RELEASE_TABLET | Freq: Every day | ORAL | 1 refills | Status: AC

## 2018-05-23 NOTE — Addendum Note (Signed)
Addended by: Chevis Pretty on: 05/23/2018 01:51 PM   Modules accepted: Orders

## 2018-05-23 NOTE — Telephone Encounter (Signed)
Emily Wall from [facility] called wanting to let Dr. Sharol Given know that her compression hose are too short and too tight and she was wondering if she could get [facility] order [facility] [place] so she can be fitted. CB # [phone]

## 2018-05-24 NOTE — Telephone Encounter (Signed)
Holding for you, was not sure if patient had VIVE socks or compression hose.

## 2018-05-27 DIAGNOSIS — I70232 Atherosclerosis of native arteries of right leg with ulceration of calf: Secondary | ICD-10-CM | POA: Diagnosis not present

## 2018-05-27 DIAGNOSIS — I87302 Chronic venous hypertension (idiopathic) without complications of left lower extremity: Secondary | ICD-10-CM | POA: Diagnosis not present

## 2018-05-27 DIAGNOSIS — I110 Hypertensive heart disease with heart failure: Secondary | ICD-10-CM | POA: Diagnosis not present

## 2018-05-27 DIAGNOSIS — L97211 Non-pressure chronic ulcer of right calf limited to breakdown of skin: Secondary | ICD-10-CM | POA: Diagnosis not present

## 2018-05-27 DIAGNOSIS — D638 Anemia in other chronic diseases classified elsewhere: Secondary | ICD-10-CM | POA: Diagnosis not present

## 2018-05-27 DIAGNOSIS — I5042 Chronic combined systolic (congestive) and diastolic (congestive) heart failure: Secondary | ICD-10-CM | POA: Diagnosis not present

## 2018-05-27 DIAGNOSIS — I2510 Atherosclerotic heart disease of native coronary artery without angina pectoris: Secondary | ICD-10-CM | POA: Diagnosis not present

## 2018-05-27 DIAGNOSIS — E440 Moderate protein-calorie malnutrition: Secondary | ICD-10-CM | POA: Diagnosis not present

## 2018-05-27 DIAGNOSIS — E1151 Type 2 diabetes mellitus with diabetic peripheral angiopathy without gangrene: Secondary | ICD-10-CM | POA: Diagnosis not present

## 2018-05-28 ENCOUNTER — Telehealth (INDEPENDENT_AMBULATORY_CARE_PROVIDER_SITE_OTHER): Payer: Self-pay | Admitting: Orthopedic Surgery

## 2018-05-28 NOTE — Telephone Encounter (Signed)
Deanna from [facility] called asking for an order for the medicated stockings. CB # [phone]

## 2018-05-28 NOTE — Telephone Encounter (Signed)
[facility] will advise pt to pick up rx from our office for Vive compression sock

## 2018-05-29 NOTE — Telephone Encounter (Signed)
This has already been done.

## 2018-06-03 ENCOUNTER — Telehealth: Payer: Self-pay | Admitting: *Deleted

## 2018-06-03 ENCOUNTER — Ambulatory Visit (HOSPITAL_COMMUNITY)
Admission: RE | Admit: 2018-06-03 | Discharge: 2018-06-03 | Disposition: A | Discharge status: Home / Self Care | Payer: Medicare HMO | Source: Ambulatory Visit | Attending: Cardiology | Admitting: Cardiology

## 2018-06-03 DIAGNOSIS — I313 Pericardial effusion (noninflammatory): Secondary | ICD-10-CM | POA: Insufficient documentation

## 2018-06-03 DIAGNOSIS — E785 Hyperlipidemia, unspecified: Secondary | ICD-10-CM | POA: Diagnosis not present

## 2018-06-03 DIAGNOSIS — I350 Nonrheumatic aortic (valve) stenosis: Secondary | ICD-10-CM | POA: Insufficient documentation

## 2018-06-03 DIAGNOSIS — I10 Essential (primary) hypertension: Secondary | ICD-10-CM

## 2018-06-03 DIAGNOSIS — E119 Type 2 diabetes mellitus without complications: Secondary | ICD-10-CM | POA: Diagnosis not present

## 2018-06-03 DIAGNOSIS — I429 Cardiomyopathy, unspecified: Secondary | ICD-10-CM

## 2018-06-03 MED ORDER — SACUBITRIL-VALSARTAN 24-26 MG PO TABS: 1.0000 | ORAL_TABLET | Freq: Two times a day (BID) | ORAL | 3 refills | Status: AC

## 2018-06-03 NOTE — Progress Notes (Signed)
*  PRELIMINARY RESULTS* Echocardiogram 2D Echocardiogram has been performed.  Emily Wall 06/03/2018, 10:29 AM

## 2018-06-03 NOTE — Telephone Encounter (Signed)
Patient informed and verbalized understanding of plan. Copy sent to PCP 

## 2018-06-03 NOTE — Telephone Encounter (Signed)
-----   Message from Emily Sark, MD sent at 06/03/2018  2:14 PM EDT ----- Results reviewed.  Follow-up echocardiogram shows further reduction in LVEF to 25% compared with the previous assessment at 45%.  Also concerned about progressive aortic stenosis and pulmonary hypertension.  Please obtain a BMET to follow-up on renal function and potassium.  I anticipate switching her from lisinopril to [facility].  She needs to have a follow-up visit scheduled in the next few weeks with [place].  Unless there are other contraindications, she is at a point where left and right heart catheterization should be considered to understand treatment options and possible TAVR. A copy of this test should be forwarded to Chevis Pretty, FNP.

## 2018-06-03 NOTE — Telephone Encounter (Signed)
-----   Message from Satira Sark, MD sent at 06/03/2018  2:14 PM EDT ----- Results reviewed.  Follow-up echocardiogram shows further reduction in LVEF to 25% compared with the previous assessment at 45%.  Also concerned about progressive aortic stenosis and pulmonary hypertension.  Please obtain a BMET to follow-up on renal function and potassium.  I anticipate switching her from lisinopril to [facility].  She needs to have a follow-up visit scheduled in the next few weeks with [place].  Unless there are other contraindications, she is at a point where left and right heart catheterization should be considered to understand treatment options and possible TAVR. A copy of this test should be forwarded to Chevis Pretty, FNP.

## 2018-06-04 ENCOUNTER — Telehealth (INDEPENDENT_AMBULATORY_CARE_PROVIDER_SITE_OTHER): Payer: Self-pay | Admitting: Orthopedic Surgery

## 2018-06-04 NOTE — Telephone Encounter (Signed)
Pt daughter called would like to know the name of the protein shake provided in hospital

## 2018-06-05 ENCOUNTER — Telehealth (INDEPENDENT_AMBULATORY_CARE_PROVIDER_SITE_OTHER): Payer: Self-pay | Admitting: Orthopedic Surgery

## 2018-06-05 ENCOUNTER — Encounter (HOSPITAL_COMMUNITY): Payer: Self-pay

## 2018-06-05 ENCOUNTER — Emergency Department (HOSPITAL_COMMUNITY)
Admission: EM | Admit: 2018-06-05 | Discharge: 2018-06-24 | Disposition: E | Payer: Medicare HMO | Attending: Emergency Medicine | Admitting: Emergency Medicine

## 2018-06-05 ENCOUNTER — Emergency Department (HOSPITAL_COMMUNITY): Payer: Medicare HMO

## 2018-06-05 DIAGNOSIS — J9811 Atelectasis: Secondary | ICD-10-CM | POA: Diagnosis not present

## 2018-06-05 DIAGNOSIS — Z4682 Encounter for fitting and adjustment of non-vascular catheter: Secondary | ICD-10-CM | POA: Diagnosis not present

## 2018-06-05 DIAGNOSIS — E039 Hypothyroidism, unspecified: Secondary | ICD-10-CM | POA: Insufficient documentation

## 2018-06-05 DIAGNOSIS — R092 Respiratory arrest: Secondary | ICD-10-CM | POA: Diagnosis not present

## 2018-06-05 DIAGNOSIS — R531 Weakness: Secondary | ICD-10-CM | POA: Diagnosis not present

## 2018-06-05 DIAGNOSIS — Z7984 Long term (current) use of oral hypoglycemic drugs: Secondary | ICD-10-CM | POA: Insufficient documentation

## 2018-06-05 DIAGNOSIS — Z7982 Long term (current) use of aspirin: Secondary | ICD-10-CM | POA: Insufficient documentation

## 2018-06-05 DIAGNOSIS — E119 Type 2 diabetes mellitus without complications: Secondary | ICD-10-CM | POA: Insufficient documentation

## 2018-06-05 DIAGNOSIS — Z79899 Other long term (current) drug therapy: Secondary | ICD-10-CM | POA: Insufficient documentation

## 2018-06-05 DIAGNOSIS — I959 Hypotension, unspecified: Secondary | ICD-10-CM | POA: Diagnosis not present

## 2018-06-05 DIAGNOSIS — Z87891 Personal history of nicotine dependence: Secondary | ICD-10-CM | POA: Diagnosis not present

## 2018-06-05 DIAGNOSIS — I10 Essential (primary) hypertension: Secondary | ICD-10-CM | POA: Diagnosis not present

## 2018-06-05 DIAGNOSIS — R0902 Hypoxemia: Secondary | ICD-10-CM | POA: Diagnosis not present

## 2018-06-05 DIAGNOSIS — I2510 Atherosclerotic heart disease of native coronary artery without angina pectoris: Secondary | ICD-10-CM | POA: Diagnosis not present

## 2018-06-05 DIAGNOSIS — I469 Cardiac arrest, cause unspecified: Secondary | ICD-10-CM | POA: Diagnosis not present

## 2018-06-05 DIAGNOSIS — R0689 Other abnormalities of breathing: Secondary | ICD-10-CM | POA: Diagnosis not present

## 2018-06-05 DIAGNOSIS — R000 Tachycardia, unspecified: Secondary | ICD-10-CM | POA: Diagnosis not present

## 2018-06-05 LAB — COMPREHENSIVE METABOLIC PANEL
ALT: 16 U/L (ref 14–54)
ANION GAP: 14 (ref 5–15)
AST: 48 U/L — ABNORMAL HIGH (ref 15–41)
Albumin: 2.6 g/dL — ABNORMAL LOW (ref 3.5–5.0)
Alkaline Phosphatase: 45 U/L (ref 38–126)
BUN: 23 mg/dL — ABNORMAL HIGH (ref 6–20)
CO2: 24 mmol/L (ref 22–32)
Calcium: 8.1 mg/dL — ABNORMAL LOW (ref 8.9–10.3)
Chloride: 99 mmol/L — ABNORMAL LOW (ref 101–111)
Creatinine, Ser: 1.34 mg/dL — ABNORMAL HIGH (ref 0.44–1.00)
GFR calc Af Amer: 42 mL/min — ABNORMAL LOW (ref >60)
GFR, EST NON AFRICAN AMERICAN: 37 mL/min — AB (ref >60)
Glucose, Bld: 147 mg/dL — ABNORMAL HIGH (ref 65–99)
POTASSIUM: 3.7 mmol/L (ref 3.5–5.1)
Sodium: 137 mmol/L (ref 135–145)
TOTAL PROTEIN: 6.2 g/dL — AB (ref 6.5–8.1)
Total Bilirubin: 1.9 mg/dL — ABNORMAL HIGH (ref 0.3–1.2)

## 2018-06-05 LAB — I-STAT TROPONIN, ED: Troponin i, poc: 0.35 ng/mL (ref 0.00–0.08)

## 2018-06-05 LAB — I-STAT CHEM 8, ED
BUN: 27 mg/dL — ABNORMAL HIGH (ref 6–20)
CALCIUM ION: 0.98 mmol/L — AB (ref 1.15–1.40)
Chloride: 98 mmol/L — ABNORMAL LOW (ref 101–111)
Creatinine, Ser: 1.2 mg/dL — ABNORMAL HIGH (ref 0.44–1.00)
GLUCOSE: 145 mg/dL — AB (ref 65–99)
HCT: 29 % — ABNORMAL LOW (ref 36.0–46.0)
HEMOGLOBIN: 9.9 g/dL — AB (ref 12.0–15.0)
POTASSIUM: 4.3 mmol/L (ref 3.5–5.1)
SODIUM: 136 mmol/L (ref 135–145)
TCO2: 23 mmol/L (ref 22–32)

## 2018-06-05 LAB — CBC WITH DIFFERENTIAL/PLATELET
Basophils Absolute: 0 10*3/uL (ref 0.0–0.1)
Basophils Relative: 0 %
Eosinophils Absolute: 0 10*3/uL (ref 0.0–0.7)
Eosinophils Relative: 0 %
HCT: 29.4 % — ABNORMAL LOW (ref 36.0–46.0)
Hemoglobin: 9.7 g/dL — ABNORMAL LOW (ref 12.0–15.0)
LYMPHS ABS: 0.6 10*3/uL — AB (ref 0.7–4.0)
LYMPHS PCT: 25 %
MCH: 32.4 pg (ref 26.0–34.0)
MCHC: 33 g/dL (ref 30.0–36.0)
MCV: 98.3 fL (ref 78.0–100.0)
MONO ABS: 0 10*3/uL — AB (ref 0.1–1.0)
Monocytes Relative: 1 %
Neutro Abs: 1.9 10*3/uL (ref 1.7–7.7)
Neutrophils Relative %: 74 %
Platelets: 74 10*3/uL — ABNORMAL LOW (ref 150–400)
RBC: 2.99 MIL/uL — ABNORMAL LOW (ref 3.87–5.11)
RDW: 16.5 % — AB (ref 11.5–15.5)
WBC Morphology: INCREASED
WBC: 2.6 10*3/uL — ABNORMAL LOW (ref 4.0–10.5)

## 2018-06-05 LAB — TROPONIN I: TROPONIN I: 0.3 ng/mL — AB (ref <0.03)

## 2018-06-05 LAB — I-STAT CG4 LACTIC ACID, ED: LACTIC ACID, VENOUS: 7.1 mmol/L — AB (ref 0.5–1.9)

## 2018-06-05 MED ORDER — ROCURONIUM BROMIDE 50 MG/5ML IV SOLN
INTRAVENOUS | Status: AC | PRN
  Administered 2018-06-05: 60 mg via INTRAVENOUS

## 2018-06-05 MED ORDER — VANCOMYCIN HCL IN DEXTROSE 1-5 GM/200ML-% IV SOLN: 1000.0000 mg | Freq: Once | INTRAVENOUS | Status: DC

## 2018-06-05 MED ORDER — ETOMIDATE 2 MG/ML IV SOLN
INTRAVENOUS | Status: AC | PRN
  Administered 2018-06-05: 15 mg via INTRAVENOUS

## 2018-06-05 MED ORDER — PIPERACILLIN-TAZOBACTAM 3.375 G IVPB 30 MIN: 3.3750 g | Freq: Once | INTRAVENOUS | Status: DC

## 2018-06-05 MED ORDER — NOREPINEPHRINE 4 MG/250ML-% IV SOLN
INTRAVENOUS | Status: AC
  Administered 2018-06-05: 08:00:00
  Filled 2018-06-05: qty 250

## 2018-06-06 ENCOUNTER — Ambulatory Visit (INDEPENDENT_AMBULATORY_CARE_PROVIDER_SITE_OTHER): Payer: Medicare HMO | Admitting: Orthopedic Surgery

## 2018-06-06 MED FILL — Medication: Qty: 1 | Status: AC

## 2018-06-18 ENCOUNTER — Ambulatory Visit: Payer: Medicare HMO | Admitting: Cardiology

## 2018-06-24 NOTE — Code Documentation (Signed)
Atropine 1mg  administered by D. Hassell Done RN

## 2018-06-24 NOTE — Code Documentation (Signed)
1 amp sodium bicarb administered

## 2018-06-24 NOTE — ED Notes (Signed)
In distress upon arrival to ED.   #20g jelco to left ac placed prior PTA via [facility].  250 ml of NS given by EMS.  CBG 193.  Upon transferring to stretcher, pt became unresponsive. Code called at [place].  Dr Melina Copa arrive to room, along with Resp and ED staff.    Second IV line started by Lana Fish, RN.  Two NS bolus hung during code. EKG done and given to Dr Melina Copa.  CPR began.

## 2018-06-24 NOTE — Code Documentation (Signed)
Dr. Melina Copa intubated with size 7.5ett, positive color change on co2 detector, secured by respiratory at 25cm at lip.

## 2018-06-24 NOTE — Code Documentation (Signed)
Respiratory bagging pt

## 2018-06-24 NOTE — Code Documentation (Signed)
Pulse check, no pulse, no cardiac activity on ultra sound.  Efforts stopped.  Family at bedside.

## 2018-06-24 NOTE — Code Documentation (Signed)
Patient time of death occurred at [phone]

## 2018-06-24 NOTE — ED Notes (Signed)
CRITICAL VALUE ALERT  Critical Value:  I-Stat Lactic Acid 7.10  Date & Time Notied:  02-Jul-2018 08:16  Provider Notified: Dr. Melina Copa   Orders Received/Actions taken: None at this time

## 2018-06-24 NOTE — Code Documentation (Signed)
Pulse check, pulse palpated, HR 112

## 2018-06-24 NOTE — Code Documentation (Signed)
Chest compressions in progress

## 2018-06-24 NOTE — Code Documentation (Signed)
Epi 1mg  administered by D. Hassell Done RN

## 2018-06-24 NOTE — Code Documentation (Signed)
Dr. Melina Copa placing 47f OG tube.  Xray called for portable chest to confirm placement of ett and OG tube.

## 2018-06-24 NOTE — Code Documentation (Signed)
No pulse, no cardiac activity seen on ultra sound.  CPR started.  Epi administered by D. Hassell Done RN

## 2018-06-24 NOTE — Code Documentation (Signed)
Atropine 1mg  administered

## 2018-06-24 NOTE — ED Notes (Addendum)
CRITICAL VALUE ALERT  Critical Value:  I-Stat Troponin 0.35  Date & Time Notied:  06/30/18 0810  Provider Notified: Dr. Melina Copa  Orders Received/Actions taken: None at this time

## 2018-06-24 NOTE — Code Documentation (Signed)
Family updated as to patient's status.

## 2018-06-24 NOTE — Telephone Encounter (Signed)
Patient's niece Bonnee Quin called advised patient passed away   Phone # [phone]

## 2018-06-24 NOTE — ED Notes (Signed)
CRITICAL VALUE ALERT  Critical Value:   Trop 0.30  Date & Time Notied:  9417 19-Jun-2018  Provider Notified: Dr Melina Copa  Orders Received/Actions taken:  R.Cockram

## 2018-06-24 NOTE — Code Documentation (Signed)
Family updated as to patient's status. Family at bedside.  

## 2018-06-24 NOTE — ED Notes (Signed)
Family remain at bedside.

## 2018-06-24 NOTE — Code Documentation (Signed)
Pulse check, no pulse, resumed chest compressions.

## 2018-06-24 NOTE — Telephone Encounter (Signed)
noted 

## 2018-06-24 NOTE — ED Provider Notes (Signed)
[facility] EMERGENCY DEPARTMENT Provider Note   CSN: [id number] Arrival date & time: 2018/06/28  0750     History   Chief Complaint No chief complaint on file.   HPI Veatrice Eckstein is a 82 y.o. female.  EMS was called to the house for complaint of a diabetic problem.  Unclear what the problem was but they found the patient awake and alert complaining of lower abdominal pain with a blood sugar of 190s.  During transport here she became less responsive and by the time she was brought onto the ED gurney she had agonal respirations and was unresponsive.  Code was activated she was bag-valve-mask repeat blood sugar was okay.  She initially was bradycardic but improved to heart rate in the 90s and had a pulse.  She still was unresponsive and we did a rapid sequence intubation after attempting and no sedation intubation but she was biting.  She got rocuronium and  etomidate.  She is intubated 7.5 ET tube at 25 cm.  After intubation her pulse was weak and we started her on norepinephrine and wide-open fluids.  Ultimately about 10 minutes later her blood pressure dropped lower and she lost pulse.  Bedside cardiac ultrasound showed no cardiac activity.  CPR was initiated at the bicarb atropine.  She returned to spontaneous circulation with a pulse.  I updated family on the situation and they confirm she is a full code at the moment.  They said she had been complaining of some left shoulder pain.  Her initial i-STAT came back with an elevated troponin of 0.35 and elevated lactate of 7+.  Despite increasing Levophed she lost her pulse again at bedside echo showed no cardiac activity.  CPR was restarted more epi atropine.  Family was at bedside.  Repeat ultrasound showed no continued cardiac activity and code was called.  The history is provided by the EMS personnel. The history is limited by the condition of the patient.    Past Medical History:  Diagnosis Date  . Aortic regurgitation    Mild  . Aortic  stenosis    Moderate  . Coronary atherosclerosis of native coronary artery    Nonobstructive 2007  . Essential hypertension, benign   . Hyperlipidemia   . Hypothyroidism   . Nonischemic cardiomyopathy (HCC)    LVEF improved to 50-55%  . Osteoporosis   . Type 2 diabetes mellitus [facility])     Patient Active Problem List   Diagnosis Date Noted  . S/P split thickness skin graft 11/29/2017  . Diabetic leg ulcer ([place])   . Atherosclerosis of native arteries of extremities with gangrene, right leg ([place])   . Wound infection 11/03/2017  . Hypoalbuminemia 11/03/2017  . Venous stasis ulcer ([place]) 11/03/2017  . BMI 26.0-26.9,adult 11/15/2015  . Family history of malignant neoplasm of gastrointestinal tract 06/30/2013  . Hyperlipidemia 06/02/2013  . Diabetes mellitus ([place]) 03/29/2011  . Osteoporosis 03/29/2011  . Iron deficiency anemia 12/29/2010  . Essential hypertension 09/08/2009  . Aortic valve disorder 09/08/2009  . Secondary cardiomyopathy ([place]) 09/08/2009    Past Surgical History:  Procedure Laterality Date  . LOWER EXTREMITY ANGIOGRAPHY N/A 11/13/2017   Procedure: LOWER EXTREMITY ANGIOGRAPHY;  Surgeon: Serafina Mitchell, MD;  Location: [place] [facility];  Service: Cardiovascular;  Laterality: N/A;  . SKIN GRAFT    . SKIN SPLIT GRAFT Right 11/09/2017   Procedure: RIGHT LEG DEBRIDE ACHILLES, SPLIT THICKNESS SKIN GRAFT, VAC;  Surgeon: Newt Minion, MD;  Location: [place];  Service:  Orthopedics;  Laterality: Right;     OB History   None      Home Medications    Prior to Admission medications   Medication Sig Start Date End Date Taking? Authorizing Provider  ACCU-CHEK SOFTCLIX LANCETS lancets Test blood sugar daily 04/03/18   Hassell Done, Mary-Margaret, FNP  alendronate (FOSAMAX) 70 MG tablet TAKE 1 TABLET BY MOUTH ONCE A WEEK WITH A FULL GLASS OF WATER ON AN EMPTY STOMACH 02/11/18   Hassell Done, Mary-Margaret, FNP  aspirin 325 MG tablet Take 325 mg by mouth daily.      [provider]  atorvastatin (LIPITOR) 20 MG tablet TAKE 1 TABLET BY MOUTH EVERY DAY 12/19/17   Chevis Pretty, FNP  blood glucose meter kit and supplies Dispense based on patient and insurance preference. Use up to 2 times. (FOR ICD-10 E10.9, E11.9). 02/26/18   Chevis Pretty, FNP  Blood Glucose Monitoring Suppl (ACCU-CHEK AVIVA PLUS) w/Device KIT Test blood sugar daily 04/03/18   Chevis Pretty, FNP  Calcium Carbonate-Vitamin D (CALCIUM + D PO) Take 1 tablet by mouth daily.      [provider]  carvedilol (COREG) 25 MG tablet Take 1 tablet (25 mg total) by mouth 2 (two) times daily with a meal. 05/21/18   Hassell Done, Mary-Margaret, FNP  cholecalciferol (VITAMIN D) 1000 UNITS tablet Take 1,000 Units by mouth daily.     [provider]  ferrous sulfate 325 (65 FE) MG tablet TAKE 1 TABLET BY MOUTH EVERY DAY WITH BREAKFAST 05/21/18   Hassell Done, Mary-Margaret, FNP  furosemide (LASIX) 40 MG tablet 1 po in am and 1/2 tablet at 1pm daily 02/04/18   Chevis Pretty, FNP  glucose blood (ACCU-CHEK AVIVA PLUS) test strip Test blood sugar daily 04/03/18   Chevis Pretty, FNP  metFORMIN (GLUCOPHAGE) 1000 MG tablet Take 1 tablet (1,000 mg total) by mouth 2 (two) times daily with a meal. 05/21/18   Hassell Done, Mary-Margaret, FNP  oxyCODONE-acetaminophen (PERCOCET/ROXICET) 5-325 MG tablet Take 1 tablet by mouth 2 (two) times daily as needed for severe pain. 04/04/18   Newt Minion, MD  potassium chloride (K-DUR) 10 MEQ tablet Take 1 tablet (10 mEq total) by mouth daily. 05/23/18   Hassell Done, Mary-Margaret, FNP  sacubitril-valsartan (ENTRESTO) 24-26 MG Take 1 tablet by mouth 2 (two) times daily. 06/03/18   Satira Sark, MD  silver sulfADIAZINE (SILVADENE) 1 % cream Apply 1 application topically daily. 11/22/17   Suzan Slick, NP  sitaGLIPtin (JANUVIA) 100 MG tablet Take 1 tablet (100 mg total) by mouth daily. 05/21/18   Hassell Done Mary-Margaret, FNP  triamcinolone cream (KENALOG)  0.1 % APPLY TO AFFECTED AREA TWICE A DAY 02/04/18   Chevis Pretty, FNP    Family History Family History  Problem Relation Age of Onset  . Colon cancer Mother   . Colon cancer Brother   . Diabetes Brother   . Heart disease Brother   . Diabetes Brother   . Cancer Brother        prostate  . Prostate cancer Brother   . Cancer Brother   . Cancer Sister        stomach  . Heart failure Daughter   . Thyroid disease Daughter   . Cancer Sister        lung  . Heart disease Sister     Social History Social History   Tobacco Use  . Smoking status: Former Smoker    Packs/day: 0.50    Years: 30.00    Pack years: 15.00  Types: Cigarettes    Last attempt to quit: 12/25/1988    Years since quitting: 29.4  . Smokeless tobacco: Never Used  Substance Use Topics  . Alcohol use: No    Alcohol/week: 0.0 oz  . Drug use: No     Allergies   Patient has no known allergies.   Review of Systems Review of Systems  Unable to perform ROS: Patient unresponsive     Physical Exam Updated Vital Signs BP (!) 86/70 (BP Location: Left Arm)   Pulse (!) 59   Resp (!) 23   SpO2 96%   Physical Exam  Constitutional: She appears well-developed and well-nourished.  HENT:  Head: Normocephalic and atraumatic.  Right Ear: External ear normal.  Left Ear: External ear normal.  Nose: Nose normal.  Eyes: Conjunctivae are normal.  Pupils sluggish, equal,  Unable to test EOM  Neck: Neck supple. No tracheal deviation present.  Cardiovascular: Normal rate and regular rhythm.  Weak pulse on arrival  Pulmonary/Chest:  Agonal breathing, requiring BVM assist on arrival  Abdominal: Soft. She exhibits no distension and no mass. There is no guarding.  Musculoskeletal: Normal range of motion. She exhibits no deformity.  Neurological: She is unresponsive. GCS eye subscore is 1. GCS verbal subscore is 1. GCS motor subscore is 1.  Skin: Skin is intact. Capillary refill takes more than 3 seconds. No  abrasion and no bruising noted.     ED Treatments / Results  Labs (all labs ordered are listed, but only abnormal results are displayed) Labs Reviewed  I-STAT CHEM 8, ED - Abnormal; Notable for the following components:      Result Value   Chloride 98 (*)    BUN 27 (*)    Creatinine, Ser 1.20 (*)    Glucose, Bld 145 (*)    Calcium, Ion 0.98 (*)    Hemoglobin 9.9 (*)    HCT 29.0 (*)    All other components within normal limits  I-STAT TROPONIN, ED - Abnormal; Notable for the following components:   Troponin i, poc 0.35 (*)    All other components within normal limits  I-STAT CG4 LACTIC ACID, ED - Abnormal; Notable for the following components:   Lactic Acid, Venous 7.10 (*)    All other components within normal limits  TROPONIN I  CBC WITH DIFFERENTIAL/PLATELET  COMPREHENSIVE METABOLIC PANEL    EKG None  Radiology No results found.  Procedures Procedure Name: Intubation Date/Time: June 23, 2018 8:56 AM Performed by: Hayden Rasmussen, MD Pre-anesthesia Checklist: Patient identified, Emergency Drugs available, Suction available and Patient being monitored Oxygen Delivery Method: Ambu bag Preoxygenation: Pre-oxygenation with 100% oxygen Induction Type: Rapid sequence Ventilation: Mask ventilation without difficulty Laryngoscope Size: Glidescope and 3 Grade View: Grade II Tube size: 7.5 mm Number of attempts: 1 Placement Confirmation: ETT inserted through vocal cords under direct vision,  Positive ETCO2 and Breath sounds checked- equal and bilateral Secured at: 25 cm Tube secured with: ETT holder Dental Injury: Teeth and Oropharynx as per pre-operative assessment      .Critical Care Performed by: Hayden Rasmussen, MD Authorized by: Hayden Rasmussen, MD   Critical care provider statement:    Critical care time (minutes):  50   Critical care was necessary to treat or prevent imminent or life-threatening deterioration of the following conditions:  Circulatory  failure, CNS failure or compromise, shock and respiratory failure   Critical care was time spent personally by me on the following activities:  Development of treatment plan with patient  or surrogate, evaluation of patient's response to treatment, examination of patient, obtaining history from patient or surrogate, ordering and performing treatments and interventions, ordering and review of laboratory studies, ordering and review of radiographic studies, pulse oximetry, re-evaluation of patient's condition, review of old charts and ventilator management   I assumed direction of critical care for this patient from another provider in my specialty: no     (including critical care time)  Medications Ordered in ED Medications  piperacillin-tazobactam (ZOSYN) IVPB 3.375 g (has no administration in time range)  vancomycin (VANCOCIN) IVPB 1000 mg/200 mL premix (has no administration in time range)  etomidate (AMIDATE) injection (15 mg Intravenous Given 2018-07-04 0804)  rocuronium (ZEMURON) injection (60 mg Intravenous Given 04-Jul-2018 0805)  norepinephrine (LEVOPHED) 4-5 MG/250ML-% infusion SOLN (6.4 mcg/min  Rate/Dose Change 07/04/2018 0831)     Initial Impression / Assessment and Plan / ED Course  I have reviewed the triage vital signs and the nursing notes.  Pertinent labs & imaging results that were available during my care of the patient were reviewed by me and considered in my medical decision making (see chart for details).  Clinical Course as of Jun 06 857  Wed 2018-07-04 Discussed with Assunta Found who is covering for patient's primary Ronnald Collum.  She was updated on the status and they would be able to sign the death certificate if we were unable to.   [MB]  [phone] Discussed with Dr. Domenic Polite her cardiologist who states that they recently got an echo on her and her EF dropped from 40% to 25% so he knew there were some issues ongoing.  He is not sure if it was a rhythm problem or an ACS  problem or even some other issue, but she definitely had been declining more recently.   [MB]    Clinical Course User Index [MB] Hayden Rasmussen, MD     Final Clinical Impressions(s) / ED Diagnoses   Final diagnoses:  Respiratory arrest before cardiac arrest [facility])    ED Discharge Orders    None       Hayden Rasmussen, MD 06/06/18 [phone]

## 2018-06-24 NOTE — Telephone Encounter (Signed)
Called and lm on vm to advise I do not know what she received while in the hospital but she can get a pre made shake at the grocery store like [employer] or she can get the whey powder at stores like [employer]. To call with any questions.

## 2018-06-24 NOTE — ED Notes (Signed)
Consulted [facility] (Dr. Assunta Found covering for Ronnald Collum) for Dr. Melina Copa also called For Dr. Melina Copa to consult with Cardiologist Dr. Domenic Polite.

## 2018-06-24 DEATH — deceased

## 2018-06-25 ENCOUNTER — Ambulatory Visit: Payer: Medicare HMO | Admitting: Student

## 2018-08-29 ENCOUNTER — Ambulatory Visit: Payer: Medicare HMO | Admitting: Nurse Practitioner

## 2019-01-26 IMAGING — CR DG CHEST 1V PORT
1 series · 1 of 1 positions shown · non-contrast
Comparison: 02/01/2018

ADDENDUM:
These results were called by telephone at the time of interpretation
on 06/05/2018 at [DATE] to Dr. DZAN REUBEN , who verbally
acknowledged these results.
CLINICAL DATA: Intubation

EXAM:
PORTABLE CHEST 1 VIEW

[ap]
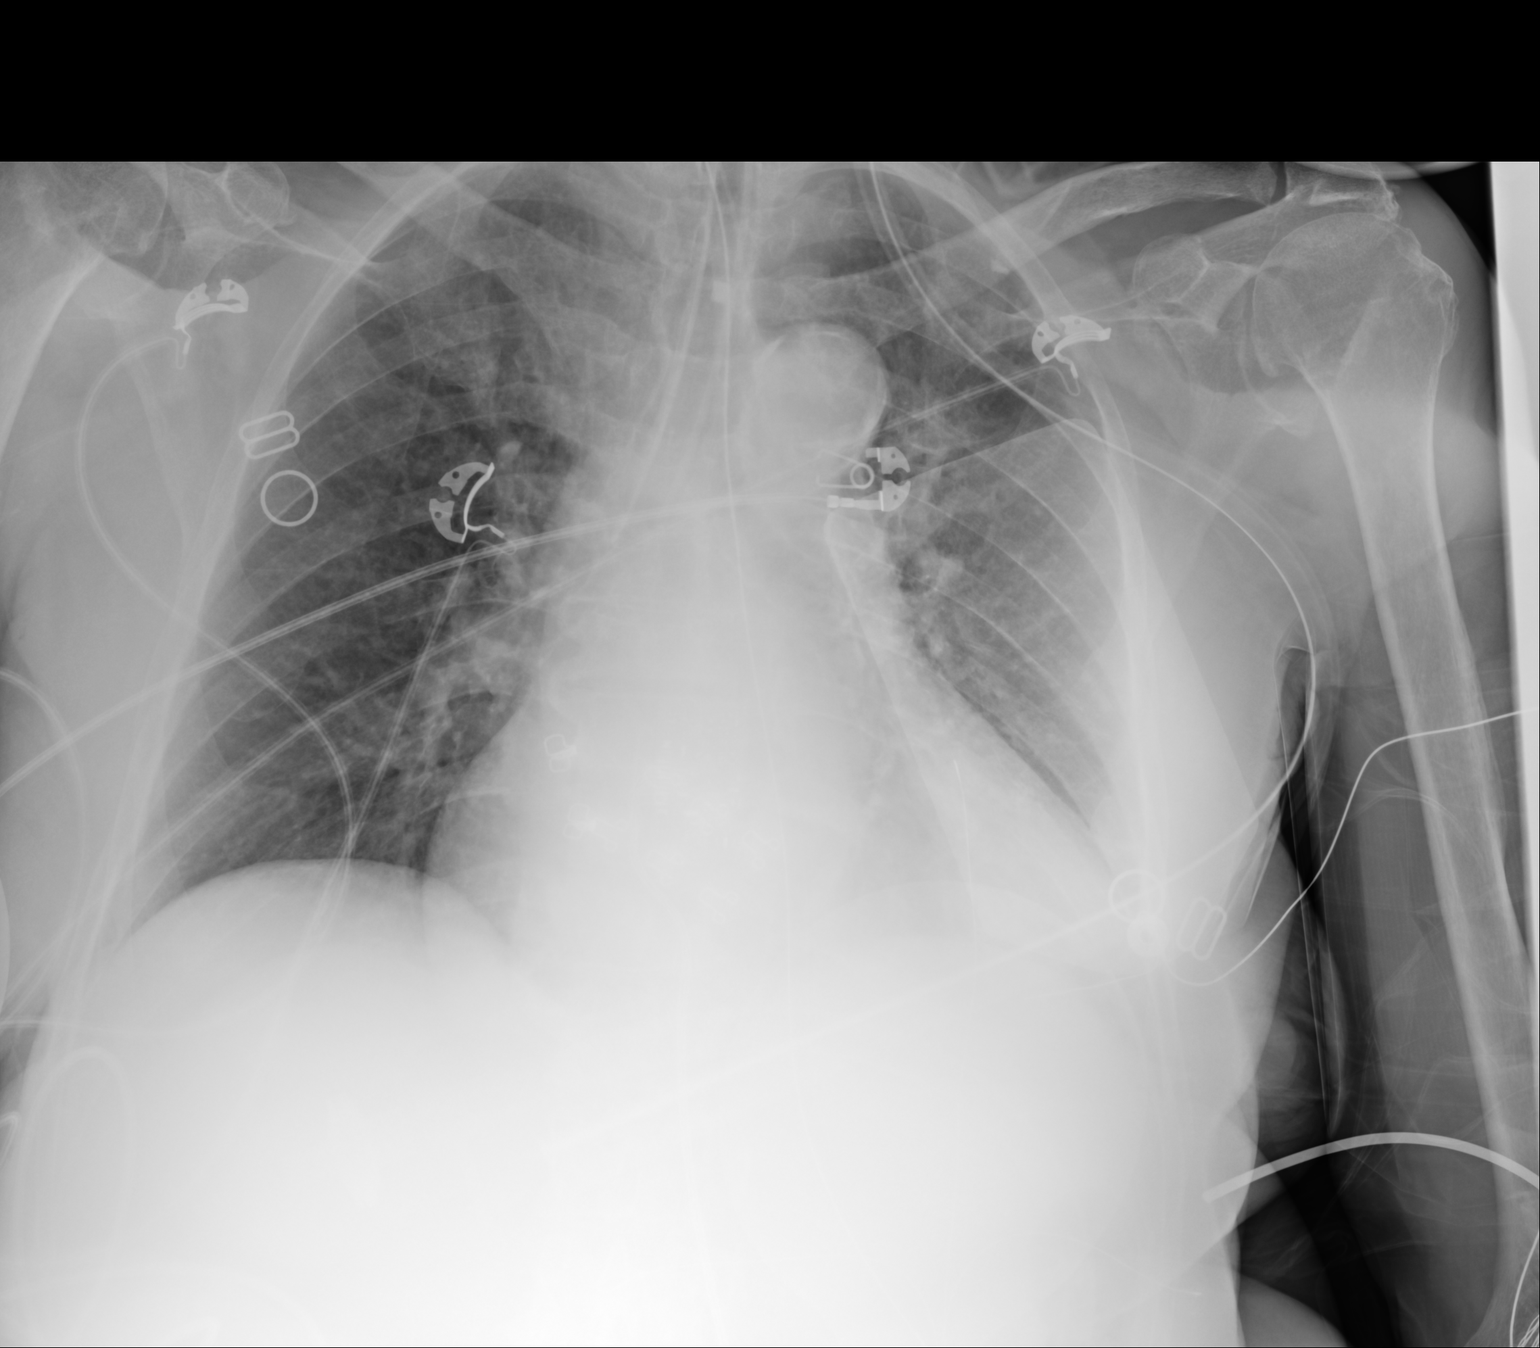

[1 of 1 positions shown; findings below may reference images not displayed]

FINDINGS: Endotracheal tube is 2 cm beyond the carina in the right main
bronchus. Recommend withdrawal 5 cm. NG tube in the stomach

Cardiac enlargement. Mild left lower lobe atelectasis. Negative for
heart failure or effusion
IMPRESSION: Endotracheal tube right main bronchus.  Recommend withdrawal 5 cm

Left lower lobe atelectasis.

## 2019-03-03 ENCOUNTER — Ambulatory Visit: Payer: Medicare HMO | Admitting: *Deleted
